# Patient Record
Sex: Male | Born: 1935
Health system: Southern US, Community
[De-identification: ages and names within clinical notes are randomized; demographics above are authoritative.]

## PROBLEM LIST (undated history)

## (undated) DIAGNOSIS — I10 Essential (primary) hypertension: Secondary | ICD-10-CM

## (undated) DIAGNOSIS — Z9981 Dependence on supplemental oxygen: Secondary | ICD-10-CM

## (undated) DIAGNOSIS — Z9889 Other specified postprocedural states: Secondary | ICD-10-CM

## (undated) DIAGNOSIS — N183 Chronic kidney disease, stage 3 unspecified: Secondary | ICD-10-CM

## (undated) DIAGNOSIS — M1712 Unilateral primary osteoarthritis, left knee: Secondary | ICD-10-CM

## (undated) DIAGNOSIS — M1711 Unilateral primary osteoarthritis, right knee: Secondary | ICD-10-CM

## (undated) DIAGNOSIS — E669 Obesity, unspecified: Secondary | ICD-10-CM

## (undated) DIAGNOSIS — H9193 Unspecified hearing loss, bilateral: Secondary | ICD-10-CM

## (undated) DIAGNOSIS — K567 Ileus, unspecified: Secondary | ICD-10-CM

## (undated) DIAGNOSIS — E1169 Type 2 diabetes mellitus with other specified complication: Secondary | ICD-10-CM

## (undated) DIAGNOSIS — K219 Gastro-esophageal reflux disease without esophagitis: Secondary | ICD-10-CM

## (undated) DIAGNOSIS — R112 Nausea with vomiting, unspecified: Secondary | ICD-10-CM

## (undated) DIAGNOSIS — M109 Gout, unspecified: Secondary | ICD-10-CM

## (undated) DIAGNOSIS — E1140 Type 2 diabetes mellitus with diabetic neuropathy, unspecified: Secondary | ICD-10-CM

## (undated) DIAGNOSIS — K9189 Other postprocedural complications and disorders of digestive system: Secondary | ICD-10-CM

## (undated) DIAGNOSIS — I499 Cardiac arrhythmia, unspecified: Secondary | ICD-10-CM

## (undated) HISTORY — DX: Essential (primary) hypertension: I10

## (undated) HISTORY — DX: Type 2 diabetes mellitus with other specified complication: E11.69

## (undated) HISTORY — PX: INNER EAR SURGERY: SHX679

## (undated) HISTORY — DX: Type 2 diabetes mellitus with other specified complication: E66.9

## (undated) HISTORY — DX: Gout, unspecified: M10.9

## (undated) HISTORY — DX: Gastro-esophageal reflux disease without esophagitis: K21.9

## (undated) HISTORY — DX: Unilateral primary osteoarthritis, left knee: M17.12

## (undated) HISTORY — DX: Unilateral primary osteoarthritis, right knee: M17.11

## (undated) HISTORY — DX: Type 2 diabetes mellitus with diabetic neuropathy, unspecified: E11.40

## (undated) HISTORY — PX: HEMORRHOID SURGERY: SHX153

## (undated) HISTORY — PX: CHOLECYSTECTOMY: SHX55

## (undated) HISTORY — PX: TONSILLECTOMY: SUR1361

## (undated) HISTORY — PX: SUPRAPUBIC PROSTATECTOMY: SUR1056

## (undated) HISTORY — PX: COLONOSCOPY: SHX174

---

## 1997-11-08 ENCOUNTER — Ambulatory Visit (HOSPITAL_COMMUNITY): Admission: RE | Admit: 1997-11-08 | Discharge: 1997-11-08 | Payer: Self-pay | Admitting: Urology

## 1998-02-27 ENCOUNTER — Ambulatory Visit (HOSPITAL_COMMUNITY): Admission: RE | Admit: 1998-02-27 | Discharge: 1998-02-27 | Payer: Self-pay | Admitting: *Deleted

## 2000-05-31 HISTORY — PX: KNEE ARTHROSCOPY: SHX127

## 2000-07-04 ENCOUNTER — Encounter: Admission: RE | Admit: 2000-07-04 | Discharge: 2000-08-25 | Payer: Self-pay | Admitting: Orthopedic Surgery

## 2000-07-21 ENCOUNTER — Encounter: Payer: Self-pay | Admitting: Orthopedic Surgery

## 2000-07-22 ENCOUNTER — Ambulatory Visit (HOSPITAL_COMMUNITY): Admission: RE | Admit: 2000-07-22 | Discharge: 2000-07-22 | Payer: Self-pay | Admitting: Orthopedic Surgery

## 2000-08-11 ENCOUNTER — Ambulatory Visit (HOSPITAL_COMMUNITY): Admission: RE | Admit: 2000-08-11 | Discharge: 2000-08-11 | Payer: Self-pay | Admitting: Orthopedic Surgery

## 2001-04-03 ENCOUNTER — Ambulatory Visit (HOSPITAL_COMMUNITY): Admission: RE | Admit: 2001-04-03 | Discharge: 2001-04-03 | Payer: Self-pay | Admitting: *Deleted

## 2004-11-11 ENCOUNTER — Ambulatory Visit: Payer: Self-pay | Admitting: Pulmonary Disease

## 2005-05-31 HISTORY — PX: SHOULDER ARTHROSCOPY WITH ROTATOR CUFF REPAIR: SHX5685

## 2005-11-12 ENCOUNTER — Ambulatory Visit (HOSPITAL_COMMUNITY): Admission: RE | Admit: 2005-11-12 | Discharge: 2005-11-12 | Payer: Self-pay | Admitting: *Deleted

## 2005-12-15 ENCOUNTER — Encounter: Admission: RE | Admit: 2005-12-15 | Discharge: 2006-01-27 | Payer: Self-pay | Admitting: Sports Medicine

## 2006-01-13 ENCOUNTER — Emergency Department (HOSPITAL_COMMUNITY): Admission: EM | Admit: 2006-01-13 | Discharge: 2006-01-13 | Payer: Self-pay | Admitting: Emergency Medicine

## 2006-01-21 ENCOUNTER — Encounter: Admission: RE | Admit: 2006-01-21 | Discharge: 2006-01-21 | Payer: Self-pay | Admitting: Internal Medicine

## 2006-02-01 ENCOUNTER — Encounter: Admission: RE | Admit: 2006-02-01 | Discharge: 2006-02-01 | Payer: Self-pay | Admitting: *Deleted

## 2006-03-14 ENCOUNTER — Encounter: Admission: RE | Admit: 2006-03-14 | Discharge: 2006-03-14 | Payer: Self-pay | Admitting: Sports Medicine

## 2006-04-12 ENCOUNTER — Ambulatory Visit (HOSPITAL_BASED_OUTPATIENT_CLINIC_OR_DEPARTMENT_OTHER): Admission: RE | Admit: 2006-04-12 | Discharge: 2006-04-13 | Payer: Self-pay | Admitting: Orthopedic Surgery

## 2006-05-31 HISTORY — PX: KNEE ARTHROSCOPY: SHX127

## 2006-06-20 ENCOUNTER — Ambulatory Visit (HOSPITAL_BASED_OUTPATIENT_CLINIC_OR_DEPARTMENT_OTHER): Admission: RE | Admit: 2006-06-20 | Discharge: 2006-06-20 | Payer: Self-pay | Admitting: Orthopedic Surgery

## 2007-01-06 ENCOUNTER — Encounter (INDEPENDENT_AMBULATORY_CARE_PROVIDER_SITE_OTHER): Payer: Self-pay | Admitting: *Deleted

## 2007-01-09 ENCOUNTER — Ambulatory Visit (HOSPITAL_BASED_OUTPATIENT_CLINIC_OR_DEPARTMENT_OTHER): Admission: RE | Admit: 2007-01-09 | Discharge: 2007-01-09 | Payer: Self-pay | Admitting: Orthopedic Surgery

## 2007-01-10 ENCOUNTER — Encounter: Payer: Self-pay | Admitting: *Deleted

## 2007-01-15 DIAGNOSIS — F0280 Dementia in other diseases classified elsewhere without behavioral disturbance: Secondary | ICD-10-CM

## 2007-01-15 DIAGNOSIS — J449 Chronic obstructive pulmonary disease, unspecified: Secondary | ICD-10-CM

## 2007-01-15 DIAGNOSIS — J4489 Other specified chronic obstructive pulmonary disease: Secondary | ICD-10-CM | POA: Insufficient documentation

## 2007-01-15 DIAGNOSIS — G309 Alzheimer's disease, unspecified: Secondary | ICD-10-CM

## 2010-05-31 HISTORY — PX: KNEE ARTHROSCOPY: SHX127

## 2010-09-24 ENCOUNTER — Encounter (HOSPITAL_BASED_OUTPATIENT_CLINIC_OR_DEPARTMENT_OTHER)
Admission: RE | Admit: 2010-09-24 | Discharge: 2010-09-24 | Disposition: A | Discharge status: Home / Self Care | Payer: Medicare Other | Source: Ambulatory Visit | Attending: Orthopedic Surgery | Admitting: Orthopedic Surgery

## 2010-09-24 ENCOUNTER — Other Ambulatory Visit: Payer: Self-pay | Admitting: Orthopedic Surgery

## 2010-09-24 ENCOUNTER — Ambulatory Visit
Admission: RE | Admit: 2010-09-24 | Discharge: 2010-09-24 | Disposition: A | Discharge status: Home / Self Care | Payer: Medicare Other | Source: Ambulatory Visit | Attending: Orthopedic Surgery | Admitting: Orthopedic Surgery

## 2010-09-24 DIAGNOSIS — Z01811 Encounter for preprocedural respiratory examination: Secondary | ICD-10-CM

## 2010-09-24 LAB — BASIC METABOLIC PANEL
BUN: 21 mg/dL (ref 6–23)
CO2: 30 mEq/L (ref 19–32)
Calcium: 9.6 mg/dL (ref 8.4–10.5)
Creatinine, Ser: 1.47 mg/dL (ref 0.4–1.5)
GFR calc Af Amer: 57 mL/min — ABNORMAL LOW (ref >60)

## 2010-09-28 ENCOUNTER — Ambulatory Visit (HOSPITAL_BASED_OUTPATIENT_CLINIC_OR_DEPARTMENT_OTHER)
Admission: RE | Admit: 2010-09-28 | Discharge: 2010-09-28 | Disposition: A | Discharge status: Home / Self Care | Payer: Medicare Other | Source: Ambulatory Visit | Attending: Orthopedic Surgery | Admitting: Orthopedic Surgery

## 2010-09-28 DIAGNOSIS — Z01812 Encounter for preprocedural laboratory examination: Secondary | ICD-10-CM | POA: Insufficient documentation

## 2010-09-28 DIAGNOSIS — M23359 Other meniscus derangements, posterior horn of lateral meniscus, unspecified knee: Secondary | ICD-10-CM | POA: Insufficient documentation

## 2010-09-28 DIAGNOSIS — M23329 Other meniscus derangements, posterior horn of medial meniscus, unspecified knee: Secondary | ICD-10-CM | POA: Insufficient documentation

## 2010-09-28 DIAGNOSIS — M2240 Chondromalacia patellae, unspecified knee: Secondary | ICD-10-CM | POA: Insufficient documentation

## 2010-09-28 DIAGNOSIS — M659 Unspecified synovitis and tenosynovitis, unspecified site: Secondary | ICD-10-CM | POA: Insufficient documentation

## 2010-09-28 LAB — GLUCOSE, CAPILLARY: Glucose-Capillary: 151 mg/dL — ABNORMAL HIGH (ref 70–99)

## 2010-10-02 ENCOUNTER — Ambulatory Visit: Payer: Medicare Other | Attending: Orthopedic Surgery | Admitting: Physical Therapy

## 2010-10-02 DIAGNOSIS — M25669 Stiffness of unspecified knee, not elsewhere classified: Secondary | ICD-10-CM | POA: Insufficient documentation

## 2010-10-02 DIAGNOSIS — IMO0001 Reserved for inherently not codable concepts without codable children: Secondary | ICD-10-CM | POA: Insufficient documentation

## 2010-10-02 DIAGNOSIS — R262 Difficulty in walking, not elsewhere classified: Secondary | ICD-10-CM | POA: Insufficient documentation

## 2010-10-02 DIAGNOSIS — M25569 Pain in unspecified knee: Secondary | ICD-10-CM | POA: Insufficient documentation

## 2010-10-06 ENCOUNTER — Ambulatory Visit: Payer: Medicare Other | Admitting: Physical Therapy

## 2010-10-08 ENCOUNTER — Ambulatory Visit: Payer: Medicare Other | Admitting: Physical Therapy

## 2010-10-13 ENCOUNTER — Ambulatory Visit: Payer: Medicare Other | Admitting: Physical Therapy

## 2010-10-13 NOTE — Op Note (Signed)
NAME:  Howard Charles, Howard Charles NO.:  [id number]   MEDICAL RECORD NO.:  [id number]          PATIENT TYPE:  AMB   LOCATION:  DSC                          FACILITY:  [facility]   PHYSICIAN:  Robert A. Thurston Hole, M.D. 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   PROCEDURES:  1. Left knee examination under anesthesia, followed by arthroscopic      partial medial and lateral meniscectomies.  2. Left knee chondroplasty with partial synovectomy.   SURGEON:  Elana Alm. Thurston Hole, M.D.   ASSISTANT:  Julien Girt, P.A.   ANESTHESIA:  Local with MAC.   OPERATIVE TIME:  30 minutes.   COMPLICATIONS:  None.   INDICATIONS FOR PROCEDURES:  Mr. Deeg is a 75 year old gentleman, who  has had significant left knee pain for the past 6 months, increasing in  nature with exam and x-rays and MRI documenting meniscal tearing with  chondromalacia and synovitis.  He has failed conservative care and is  now to undergo arthroscopy.   DESCRIPTION:  Mr. Jantz was brought into the operating room on January 09, 2007, after a knee block was placed in the holding room by  anesthesia.  He was placed on the operating table in supine position.  His left knee was examined under anesthesia.  Range of motion was 0 to  125 degrees, 1 to 2+ crepitation, knee stable to ligamentous exam, with  normal patellar tracking.  The left leg was prepped using sterile  DuraPrep and draped using sterile technique.  Initially, through an  anterolateral portal the arthroscope with a pump attached was placed,  and through an anteromedial portal, an arthroscopic  probe was placed.  On inspection of the medial compartment, he was found to have 75% grade  3 chondromalacia, which we debrided.  Medial meniscal tear,  posteromedial horn, of which 30 to 40% was resected back to a stable  rim.  The intercondylar notch was inspected and the anterior and  posterior cruciate ligaments were normal.  The lateral compartment was  inspected; 25% grade 3 chondromalacia, which was debrided.  Lateral  meniscus, 20% tear, posterolateral corner, was resected back to a stable  rim.  The patellofemoral joint showed 30 the 40% grade 3 chondromalacia,  which was debrided.  The patella tracked normally.  Moderate synovitis  in the medial and lateral gutters.  Otherwise, they were free of  pathology.  After this was done, it was felt that all pathology had been  satisfactorily addressed.  The instruments were removed.  The portals  were closed with 3-0 nylon suture and injected with 0.25% Marcaine with  epinephrine and 4 mg of morphine.  Sterile dressings were applied, and  the patient awakened and taken to the recovery room in stable condition.   FOLLOWUP CARE:  Mr. Mincey will be followed as an outpatient on Vicodin  and Mobic.  See me back in the office in a week for sutures out and  followup.      Robert A. Thurston Hole, M.D.  Electronically Signed     RAW/MEDQ  D:  01/09/2007  T:  01/09/2007  Job:  [id number]

## 2010-10-15 ENCOUNTER — Ambulatory Visit: Payer: Medicare Other | Admitting: Physical Therapy

## 2010-10-16 NOTE — Op Note (Signed)
NAME:  Howard Charles, Howard Charles NO.:  [id number]   MEDICAL RECORD NO.:  [id number]          PATIENT TYPE:  AMB   LOCATION:  DSC                          FACILITY:  [facility]   PHYSICIAN:  Robert A. Thurston Hole, M.D. DATE OF BIRTH:  02/27/1936   DATE OF PROCEDURE:  04/12/2006  DATE OF DISCHARGE:                               OPERATIVE REPORT   PREOPERATIVE DIAGNOSIS:  1. Right shoulder rotator cuff tear.  2. Right shoulder partial labrum tear.  3. Right shoulder adhesive capsulitis.  4. Right shoulder impingement.   PROCEDURE:  1. The right shoulder examination  under anesthesia, followed by      manipulation.  2. Right shoulder arthroscopically-assisted rotator cuff repair using      Arthrex suture anchors x2 with four total sutures.  3. Right shoulder lysis of adhesions.  4. Right shoulder labrum tear debridement.  5. Right shoulder subacromial decompression.   SURGEON:  Elana Alm. Thurston Hole, M.D.   ASSISTANT:  Julien Girt, P.A.   ANESTHESIA:  General.   OPERATIVE TIME:  Was 45 minutes.   COMPLICATIONS:  None.   INDICATIONS FOR PROCEDURE:  Howard Charles is a 75 year old gentleman who  has had six months of increasing right shoulder pain, with exam and MRI  documenting rotator cuff tear, labrum tear, with impingement and  adhesive capsulitis.  He has failed conservative care and is now to  undergo arthroscopy and repair.   DESCRIPTION OF PROCEDURE:  Howard Charles is brought to the operating room  on 04/12/2006, after an inter-scalene block was placed in the holding  area by anesthesia.  He was placed on the operative table in the supine  position.  After being placed under general anesthesia, initial range of  motion showed forward flexion of 130, abduction  of 120, internal and  external rotation of 50 degrees.  A gentle manipulation was carried out,  breaking up adhesions and improving forward flexion to 175, abduction to  175, internal and external rotation of 85  degrees.  The shoulder  remained stable to ligamentous exam.  He received Ancef 1 gram IV  preoperatively for prophylaxis.  His was then placed in the beach chair  position and his shoulder and  arm were prepped using sterile DuraPrep  and draped using a sterile technique.  Originally through a posterior  arthroscopic portal, the arthroscope with a pump attached was placed  into an anterior portal an arthroscopic probe was placed.  On initial  inspection, the articular cartilage and the glenohumeral joint were  found to be intact.  The anterior labrum had a partial tearing of 25%,  which was debrided.  The superior labrum and biceps tendon anchor were  intact.  The biceps tendon was intact.  The inferior labrum and anterior  inferior glenohumeral ligament complex were intact.  The posterior  labrum was intact.  The rotator cuff showed a complete tear of the supra-  spinatus, a partial tear of the infra-spinatus, and this was partially  debrided arthroscopically.  The rest of the rotator cuff was intact.  The inferior capsular recess showed a large amount of  erythematous  synovitis which was partially debrided.  The subacromial space was  entered and a lateral arthroscopic portal was made.  Moderately-  thickened bursitis was resected.  Impingement was noted and a  subacromial decompression was carried out, removing 6 mm to 8 mm of the  undersurface of the anterior, anterolateral and anteromedial acromion  and CA ligament release carried out as well.  The [facility] joint was not  impinging on motion and thus was not resected.  The rotator cuff tear  was well-visualized.  It was further debrided arthroscopically and then  through an accessory lateral portal, two separate Arthrex 5.5 mm suture  anchors were placed in the greater tuberosity.  Each of these anchors  had two sutures and each of the sutures were passed arthroscopically  through the rotator cuff tear and then tied down, thus securing  the  rotator cuff tear back down to the greater tuberosity.  A firm and tight  repair was achieved and the shoulder could be brought through a full  range of motion, with no impingement on the repair.  At this point, it  was felt that all pathology been satisfactorily addressed.  The  instruments were removed.  The portals closed with #3-0 nylon sutures.   Sterile dressings and a sling were applied, and the patient awakened and  taken to the recovery room in stable condition.   FOLLOW-UP CARE:  Howard Charles will be followed as an outpatient on  Percocet and Robaxin with early physical therapy.  He will be seen back  in the office in one week for sutures out and followup.      Robert A. Thurston Hole, M.D.  Electronically Signed     RAW/MEDQ  D:  04/12/2006  T:  04/12/2006  Job:  [id number]

## 2010-10-16 NOTE — Op Note (Signed)
[facility]  Patient:    Howard Charles, Howard Charles                       MRN: [id number] Proc. Date: 07/22/00 Attending:  Fayrene Fearing P. Aplington, M.D.                           Operative Report  PREOPERATIVE DIAGNOSIS:  Stiff right knee status post arthroscopy.  POSTOPERATIVE DIAGNOSES:  Stiff right knee status post arthroscopy.  OPERATION:  Closed manipulation right knee.  SURGEON:  Illene Labrador. Aplington, M.D.  ASSISTANT:  Nurse.  ANESTHESIA:  General.  PATHOLOGY AND INDICATIONS FOR PROCEDURE:  He had had a prior arthrotomy as a teenager and had a knee arthroscopy by me about five and one-half weeks ago. Despite aggressive physical therapy, motion passively has only been from 10 degrees to 75 degrees.  Accordingly, he is here today for the closed manipulation.  DESCRIPTION OF PROCEDURE:  Satisfactory general anesthesia.  I then brought his right leg over the edge of the table and, supporting his thigh with my left forearm, began gentle flexion of the knee with audible and palpable lysis of adhesions.  I then went back and forth between flexion and extension.  With extension, once again audible and palpable lysis of adhesions, and I was able to restore motion from 0 to 120 degrees of flexion.  He was then going to be given 30 mg of Toradol IV.  At the time of this dictation, he is being awakened to go to the recovery room with no known complications. DD:  07/22/00 TD:  07/23/00 Job: [id number]

## 2010-10-16 NOTE — Op Note (Signed)
NAME:  Howard Charles, ARANA NO.:  [id number]   MEDICAL RECORD NO.:  [id number]          PATIENT TYPE:  AMB   LOCATION:  DSC                          FACILITY:  [facility]   PHYSICIAN:  Robert A. Thurston Hole, M.D. DATE OF BIRTH:  [birth year]   DATE OF PROCEDURE:  06/20/2006  DATE OF DISCHARGE:                               OPERATIVE REPORT   PREOPERATIVE DIAGNOSES:  Right shoulder postoperative adhesive  capsulitis, status post rotator cuff repair and lysis of adhesions.   POSTOPERATIVE DIAGNOSES:  Right shoulder postoperative adhesive  capsulitis, status post rotator cuff repair and lysis of adhesions.   PROCEDURES:  Right shoulder examination under anesthesia, followed by  manipulation and injection.   SURGEON:  Elana Alm. Thurston Hole, M.D.   ANESTHESIA:  General.   OPERATIVE TIME:  10 minutes.   COMPLICATIONS:  None.   INDICATIONS FOR PROCEDURES:  Mr. Mcglory is a 75 year old gentleman, who  had undergone a right shoulder rotator cuff repair with arthroscopic  lysis of adhesions approximately 2 1/2 months ago.  He has had  significant persistent loss of motion and pain secondary to this.  Even  though it appears the rotator cuff is healing nicely, his adhesive  capsulitis is inhibiting his recovery, and he is not to undergo  manipulation and injection.   DESCRIPTION:  Mr. Alwin was brought to the operating room on June 20, 2006, after an interscalene block was placed in the holding room by  Anesthesia.  He was placed on the operating table in supine position.  After being placed under general anesthesia, his right shoulder was  examined.  Initial range of motion showed forward flexion of 150,  abduction of 130, internal and external rotation of 40 degrees.  A  gentle manipulation was carried out, breaking up adhesions and improving  forward flexion the 175, abduction to 170, and internal and external  rotation of 85 degrees.  The shoulder remained stable to  ligamentous  exam.  The shoulder was then sterilely injected with 80 mg of Depo-  Medrol and 10 mL of 0.2% Marcaine with epinephrine, with half of this  mixture being placed in the subacromial space and half of it in the  joint through an anterior injection under sterile conditions, and he  tolerated this well.  At this point, it was felt that all pathology had  been satisfactorily addressed.  He was then awakened, extubated and  taken to recovery in stable condition.   FOLLOWUP CARE:  Mr. Granville will be followed as an outpatient on  Percocet for pain, with early aggressive physical therapy.  He will seen  back in the office in a week for sutures out and followup.      Robert A. Thurston Hole, M.D.  Electronically Signed     RAW/MEDQ  D:  06/20/2006  T:  06/20/2006  Job:  [id number]

## 2010-10-16 NOTE — Op Note (Signed)
NAME:  Howard Charles, Howard Charles NO.:  [id number]   MEDICAL RECORD NO.:  [id number]          PATIENT TYPE:  AMB   LOCATION:  ENDO                         FACILITY:  [facility]   PHYSICIAN:  Georgiana Spinner, M.D.    DATE OF BIRTH:  July 23, 1935   DATE OF PROCEDURE:  DATE OF DISCHARGE:                                 OPERATIVE REPORT   PROCEDURE:  Colonoscopy.   INDICATIONS:  Colon polyps.   ANESTHESIA:  Fentanyl 10 mcg, Versed 2 mg.   PROCEDURE:  With the patient mildly sedated in the left lateral decubitus  position, the Olympus videoscopic colonoscope was inserted into the rectum  and passed under direct vision to the cecum identified by the ileocecal  valve and appendiceal orifice both of which were photographed. From this  point, the colonoscope was slowly withdrawn taking circumferential views of  the colonic mucosa stopping only in the rectum which appeared normal on  direct and showed hemorrhoids on retroflexed view. The endoscope was  straightened and withdrawn.  The patient's vital signs and pulse oximeter  remained stable.  The patient tolerated the procedure well without apparent  complications.   FINDINGS:  Internal hemorrhoids otherwise an unremarkable colonoscopic  examination to the cecum.   PLAN:  Repeat examination in 5 years.           ______________________________  Georgiana Spinner, M.D.     GMO/MEDQ  D:  11/12/2005  T:  11/12/2005  Job:  [id number]

## 2010-10-16 NOTE — Op Note (Signed)
[facility]  Patient:    Howard Charles, Howard Charles                      MRN: [id number] Proc. Date: 08/11/00 Adm. Date:  [id number] Attending:  Marlowe Kays Page                           Operative Report  PREOPERATIVE DIAGNOSIS:  Post arthroscopy stiffness, right knee.  POSTOPERATIVE DIAGNOSIS:  Post arthroscopy stiffness, right knee.  OPERATION PERFORMED:  Closed manipulation, right knee, with intra-articular injection 0.5% plain Marcaine.  SURGEON:  Dr. Simonne Come.  ASSISTANT:  Nurse.  ANESTHESIA: General.  PATHOLOGY AND JUSTIFICATION FOR PROCEDURE:  His original arthroscopic procedure was on June 13, 2000 or exactly two months ago. Because of stiffness, I manipulated him on July 22, 2000 with complete restoration of motion. Despite vigorous physical therapy once again, he has persisted to having some slight loss of motion from -5 to 9 degrees and is here again for a repeat manipulation.  DESCRIPTION OF PROCEDURE:  Satisfactory general anesthesia, I brought his right leg off the side of OR bed and with my left arm beneath the thigh began gentle but forceful flexion of the right knee with audible and palpable lysis of adhesions. I then brought the knee up to full extension and performed the same maneuver. I then moved back and forth between flexion and extension until his extension was symmetrical on the two knees and both knees on flexion were equal as well in flexion. At that point, I prepped the prepatellar tendon area with Betadine and injected 20 cc of 0.5% plain Marcaine into the knee joint through the transpatellar tendon approach. At the time of this dictation, he was being awakened and taken to the recovery room in satisfactory condition with no known complications. DD:  08/11/00 TD:  08/11/00 Job: [id number]

## 2010-10-16 NOTE — Op Note (Signed)
NAME:  Howard Charles, Howard Charles NO.:  [id number]   MEDICAL RECORD NO.:  [id number]          PATIENT TYPE:  AMB   LOCATION:  ENDO                         FACILITY:  [facility]   PHYSICIAN:  Georgiana Spinner, M.D.    DATE OF BIRTH:  1936/04/27   DATE OF PROCEDURE:  11/12/2005  DATE OF DISCHARGE:                                 OPERATIVE REPORT   PROCEDURES:  Upper endoscopy.   INDICATIONS:  GERD.   ANESTHESIA:  Fentanyl 50 mcg, Versed 6 mg.   PROCEDURE:  With the patient mildly sedated in the left lateral decubitus  position, the Olympus videoscopic endoscope was inserted in the mouth,  passed under direct vision through the esophagus which appeared normal.  There was a hiatal hernia seen.  We entered into the stomach.  Fundus, body,  antrum, duodenal bulb and second portion of duodenum appeared normal.  From  this point, the endoscope was slowly withdrawn taking circumferential views  of duodenal mucosa until the endoscope had been pulled back into the stomach  and placed in retroflexion to view the stomach from below.  The endoscope  was then straightened and withdrawn taking circumferential views remaining  gastric and esophageal mucosa.  The patient's vital signs and pulse oximeter  remained stable.  The patient tolerated the procedure well without apparent  complication.   FINDINGS:  Hiatal hernia, otherwise unremarkable exam.   PLAN:  Proceed to colonoscopy.           ______________________________  Georgiana Spinner, M.D.     GMO/MEDQ  D:  11/12/2005  T:  11/12/2005  Job:  [id number]

## 2010-10-16 NOTE — Procedures (Signed)
[facility]  Patient:    SHAYA, ALTAMURA Visit Number: [id number] MRN: [id number]          Service Type: END Location: ENDO Attending Physician:  Sabino Gasser Dictated by:   Sabino Gasser, M.D. Proc. Date: 04/03/01 Admit Date:  04/03/2001                             Procedure Report  PROCEDURE:  Colonoscopy.  INDICATIONS:  Rectal bleeding.  ANESTHESIA:  Demerol 70, Versed 7 mg.  ENDOSCOPIST:  Sabino Gasser, M.D.  PROCEDURE:  With the patient mildly sedated in the left lateral decubitus position the Olympus videoscopic colonoscope was inserted in the rectum and passed under direct vision to the cecum identified by the ileocecal valve and appendiceal orifice both of which were photographed.  At this point, the colonoscope was slowly withdrawn taking circumferential views of the entire colonic mucosa stopping only in the rectum which appeared normal on direct and retroflexed view.  The endoscope was straightened and withdrawn.  The patients vital signs and pulse oximeter remained stable.  The patient tolerated the procedure well with no apparent complications.  FINDINGS:  Unremarkable colonoscopic examination.  PLAN:  Repeat examination in five years. Dictated by:   Sabino Gasser, M.D. Attending Physician:  Sabino Gasser DD:  04/03/01 TD:  04/04/01 Job: [id number]

## 2010-10-20 ENCOUNTER — Ambulatory Visit: Payer: Medicare Other | Admitting: Physical Therapy

## 2010-10-20 NOTE — Op Note (Signed)
NAME:  Howard Charles, Howard Charles NO.:  [id number]  MEDICAL RECORD NO.:  [id number]          PATIENT TYPE:  LOCATION:                                 FACILITY:  PHYSICIAN:  Elana Alm. Thurston Hole, M.D.      DATE OF BIRTH:  DATE OF PROCEDURE:  09/28/2010 DATE OF DISCHARGE:                              OPERATIVE REPORT   PREOPERATIVE DIAGNOSIS:  Left knee medial and lateral meniscal tears with chondromalacia and synovitis.  POSTOPERATIVE DIAGNOSIS:  Left knee medial and lateral meniscal tears with chondromalacia and synovitis.  PROCEDURE: 1. Left knee examination under anesthesia followed by arthroscopic     partial medial and lateral meniscectomy. 2. Left knee chondroplasty with partial synovectomy.  SURGEON:  Elana Alm. Thurston Hole, MD  ANESTHESIA:  General.  OPERATIVE TIME:  30 minutes.  COMPLICATIONS:  None.  INDICATIONS FOR PROCEDURE:  Mr. Aeschliman is a 75 year old gentleman who has had significant increasing left knee pain for the past 4-5 months with exam and MRI documenting meniscal tearing with chondromalacia and synovitis.  He has failed conservative care and is now to undergo arthroscopy.  DESCRIPTION OF PROCEDURE:  Mr. Petraglia was brought to the operating room on September 28, 2010 after a knee block was placed in the holding room by Anesthesia.  He was placed on the operative table in supine position. He received Ancef 1 gram IV preoperatively for prophylaxis.  After being placed under general anesthesia, his left knee was examined.  He had full range of motion.  Knee was stable to ligamentous exam with normal patellar tracking.  Left leg was prepped using sterile DuraPrep and draped using sterile technique.  Time-out procedure was called and the correct left knee identified.  Initially through an anterolateral portal, the arthroscope with a pump attached was placed into an anteromedial portal and arthroscopic probe was placed.  On initial inspection of the  medial compartment, he was found to have 50-60% grade 3 chondromalacia which was debrided.  Medial meniscus tear and posteromedial horn of which 30% was resected back to a stable rim. Intercondylar notch was inspected.  Anterior and posterior cruciate ligaments were normal.  Lateral compartment inspected.  The articular cartilage showed only grade 1 or 2 chondromalacia.  Lateral meniscus showed a partial tear, 20% posterolateral corner which was resected back to a stable rim.  Patellofemoral joint showed 25-30% grade 3 chondromalacia which was debrided.  The patella tracked normally.  There was moderate scarred synovitis in medial and lateral gutters which was thoroughly debrided, otherwise this was free of pathology.  After this was done, it was felt that all pathology had been satisfactorily addressed.  The instruments were removed.  Portals closed with 3-0 nylon suture.  Sterile dressings were applied and the patient awakened and taken to the recovery room in stable condition.  FOLLOWUP CARE:  Mr. Pirro will be followed as an outpatient on Norco for pain.  Seen back in the office in a week for sutures out and followup.     Yaakov Saindon A. Thurston Hole, M.D.     RAW/MEDQ  D:  09/29/2010  T:  09/29/2010  Job:  [id number]  Electronically Signed by Salvatore Marvel M.D. on 10/20/2010 05:21:01 PM

## 2010-10-22 ENCOUNTER — Ambulatory Visit: Payer: Medicare Other | Admitting: Physical Therapy

## 2010-10-27 ENCOUNTER — Ambulatory Visit: Payer: Medicare Other | Admitting: Physical Therapy

## 2010-10-29 ENCOUNTER — Ambulatory Visit: Payer: Medicare Other | Admitting: Physical Therapy

## 2010-11-03 ENCOUNTER — Ambulatory Visit: Payer: Medicare Other | Attending: Orthopedic Surgery | Admitting: Physical Therapy

## 2010-11-03 DIAGNOSIS — M25569 Pain in unspecified knee: Secondary | ICD-10-CM | POA: Insufficient documentation

## 2010-11-03 DIAGNOSIS — IMO0001 Reserved for inherently not codable concepts without codable children: Secondary | ICD-10-CM | POA: Insufficient documentation

## 2010-11-03 DIAGNOSIS — M25669 Stiffness of unspecified knee, not elsewhere classified: Secondary | ICD-10-CM | POA: Insufficient documentation

## 2010-11-03 DIAGNOSIS — R262 Difficulty in walking, not elsewhere classified: Secondary | ICD-10-CM | POA: Insufficient documentation

## 2010-11-05 ENCOUNTER — Ambulatory Visit: Payer: Medicare Other | Admitting: Physical Therapy

## 2010-11-17 ENCOUNTER — Ambulatory Visit: Payer: Medicare Other | Admitting: Physical Therapy

## 2010-11-19 ENCOUNTER — Ambulatory Visit: Payer: Medicare Other | Admitting: Physical Therapy

## 2010-11-24 ENCOUNTER — Ambulatory Visit: Payer: Medicare Other | Admitting: Physical Therapy

## 2010-11-26 ENCOUNTER — Ambulatory Visit: Payer: Medicare Other | Admitting: Physical Therapy

## 2010-12-01 ENCOUNTER — Ambulatory Visit: Payer: Medicare Other | Attending: Orthopedic Surgery | Admitting: Physical Therapy

## 2010-12-01 DIAGNOSIS — M25669 Stiffness of unspecified knee, not elsewhere classified: Secondary | ICD-10-CM | POA: Insufficient documentation

## 2010-12-01 DIAGNOSIS — IMO0001 Reserved for inherently not codable concepts without codable children: Secondary | ICD-10-CM | POA: Insufficient documentation

## 2010-12-01 DIAGNOSIS — R262 Difficulty in walking, not elsewhere classified: Secondary | ICD-10-CM | POA: Insufficient documentation

## 2010-12-01 DIAGNOSIS — M25569 Pain in unspecified knee: Secondary | ICD-10-CM | POA: Insufficient documentation

## 2011-03-15 LAB — BASIC METABOLIC PANEL
BUN: 27 — ABNORMAL HIGH
CO2: 24
GFR calc non Af Amer: 49 — ABNORMAL LOW
Glucose, Bld: 163 — ABNORMAL HIGH
Potassium: 4.5
Sodium: 138

## 2011-07-06 ENCOUNTER — Other Ambulatory Visit: Payer: Self-pay | Admitting: Orthopedic Surgery

## 2011-07-06 DIAGNOSIS — M25531 Pain in right wrist: Secondary | ICD-10-CM

## 2011-07-08 ENCOUNTER — Ambulatory Visit
Admission: RE | Admit: 2011-07-08 | Discharge: 2011-07-08 | Disposition: A | Discharge status: Home / Self Care | Payer: Medicare Other | Source: Ambulatory Visit | Attending: Orthopedic Surgery | Admitting: Orthopedic Surgery

## 2011-07-08 DIAGNOSIS — M25531 Pain in right wrist: Secondary | ICD-10-CM

## 2012-06-14 ENCOUNTER — Encounter: Payer: Self-pay | Admitting: Physician Assistant

## 2012-06-14 ENCOUNTER — Encounter (HOSPITAL_COMMUNITY): Payer: Self-pay | Admitting: Pharmacy Technician

## 2012-06-14 ENCOUNTER — Other Ambulatory Visit: Payer: Self-pay | Admitting: Physician Assistant

## 2012-06-14 NOTE — H&P (Signed)
TOTAL KNEE ADMISSION H&P  Patient is being admitted for left total knee arthroplasty.  Subjective:  Chief Complaint:left knee pain.  HPI: Howard Charles, 77 y.o. male, has a history of pain and functional disability in the left knee due to arthritis and has failed non-surgical conservative treatments for greater than 12 weeks to includeNSAID's and/or analgesics, corticosteriod injections, flexibility and strengthening excercises, supervised PT with diminished ADL's post treatment, use of assistive devices and activity modification.  Onset of symptoms was gradual, starting 6 years ago with gradually worsening course since that time. The patient noted prior procedures on the knee to include  arthroscopy and menisectomy on the left knee(s).  Patient currently rates pain in the left knee(s) at 8 out of 10 with activity. Patient has night pain, worsening of pain with activity and weight bearing, pain that interferes with activities of daily living, crepitus and joint swelling.  Patient has evidence of subchondral sclerosis, periarticular osteophytes and joint space narrowing by imaging studies.  There is no active infection.  Patient Active Problem List   Diagnosis Date Noted  . ALZHEIMER'S DISEASE 01/15/2007  . COPD 01/15/2007   Past Medical History  Diagnosis Date  . Diabetes mellitus type 2 in obese   . Diabetic neuropathy   . Hypertension   . GERD (gastroesophageal reflux disease)   . Gout   . Left knee DJD   . Right knee DJD     Past Surgical History  Procedure Date  . Knee arthroscopy 2008    left   . Knee arthroscopy 2012    left  . Knee arthroscopy 2002    right  . Hemorrhoid surgery   . Inner ear surgery   . Shoulder arthroscopy with rotator cuff repair 2007     (Not in a hospital admission) No Known Allergies  History  Substance Use Topics  . Smoking status: Never Smoker   . Smokeless tobacco: Not on file  . Alcohol Use: No    Family History  Problem Relation Age  of Onset  . Hypertension Mother   . Diabetes Mother   . Heart disease Mother   . Heart disease Father   . Heart attack Father   . Hypertension Father   . Diabetes Mellitus II Father      Review of Systems  Constitutional: Negative.   HENT: Negative.        Hard of hearing   Hearing aids Visual deficit corrected by glasses  Eyes: Negative.   Respiratory: Negative.   Cardiovascular: Negative.   Gastrointestinal: Negative.   Genitourinary: Negative.   Musculoskeletal: Positive for joint pain.       Bilateral knee left worse than right  Skin: Negative.   Neurological: Negative.   Endo/Heme/Allergies: Negative.   Psychiatric/Behavioral: Negative.     Objective:  Physical Exam  Constitutional: He is oriented to person, place, and time. He appears well-developed and well-nourished.  HENT:  Head: Normocephalic and atraumatic.  Eyes: EOM are normal. Pupils are equal, round, and reactive to light.  Neck: Neck supple.  Cardiovascular: Normal rate.   Respiratory: Effort normal and breath sounds normal.  GI: Soft. Bowel sounds are normal.  Genitourinary:       Not pertinent to current symptomatology therefore not examined.  Musculoskeletal:       Examination of his left knee reveals pain medially and laterally.  1+ crepitation.  1+ synovitis.  Range of motion -5 to 125 degrees.  Knee is stable with normal patella tracking and  diffuse pain.  Examination of his right knee reveals 1+ crepitation.  1+ synovitis.  Range of motion -5 to 125 degrees.  Mild varus deformity.  Knee is stable to ligamentous exam with normal patella tracking.  Vascular exam: Pulses are 2+ and symmetric.    Neurological: He is alert and oriented to person, place, and time.  Skin: Skin is warm and dry.  Psychiatric: He has a normal mood and affect.    Vital signs in last 24 hours: . Last recorded: 01/15 1500   BP: 118/75 Pulse: 69  Temp: 97.5 F (36.4 C) Resp: 12  Height: 5\' 9"  (1.753 m) SpO2: 95    Weight: 97.07 kg (214 lb)     Labs:   Estimated Body mass index is 31.60 kg/(m^2) as calculated from the following:   Height as of this encounter: 5\' 9" (1.753 m).   Weight as of this encounter: 214 lb(97.07 kg).   Imaging Review Plain radiographs demonstrate severe degenerative joint disease of the left knee(s). The overall alignment ismild valgus. The bone quality appears to be good for age and reported activity level.  Assessment/Plan:  End stage arthritis, left knee   The patient history, physical examination, clinical judgment of the provider and imaging studies are consistent with end stage degenerative joint disease of the left knee(s) and total knee arthroplasty is deemed medically necessary. The treatment options including medical management, injection therapy arthroscopy and arthroplasty were discussed at length. The risks and benefits of total knee arthroplasty were presented and reviewed. The risks due to aseptic loosening, infection, stiffness, patella tracking problems, thromboembolic complications and other imponderables were discussed. The patient acknowledged the explanation, agreed to proceed with the plan and consent was signed. Patient is being admitted for inpatient treatment for surgery, pain control, PT, OT, prophylactic antibiotics, VTE prophylaxis, progressive ambulation and ADL's and discharge planning. The patient is planning to be discharged home with home health services  Ahijah Devery A. Gwinda Passe Physician Assistant [facility] [phone]  06/14/2012, 4:52 PM

## 2012-06-19 NOTE — Pre-Procedure Instructions (Signed)
Howard Charles  06/19/2012   Your procedure is scheduled on:  Monday, January 27th   Report to [facility] at  7:45  AM.  Call this number if you have problems the morning of surgery: [phone]   Remember:   Do not eat food or drink liquids after midnight Sunday.   Take these medicines the morning of surgery with A SIP OF WATER:  Coreg, Protonix, Requip   Do not wear jewelry, make-up or nail polish.  Do not wear lotions, powders, or colognes. You may NOT wear deodorant.   Men may shave face and neck.   Do not bring valuables to the hospital.  Contacts, dentures or bridgework may not be worn into surgery.   Leave suitcase in the car. After surgery it may be brought to your room.  For patients admitted to the hospital, checkout time is 11:00 AM the day of discharge.   Patients discharged the day of surgery will not be allowed to drive home.  Name and phone number of your driver:   Special Instructions: Shower using CHG 2 nights before surgery and the night before surgery.  If you shower the day of surgery use CHG.  Use special wash - you have one bottle of CHG for all showers.  You should use approximately 1/3 of the bottle for each shower.   Please read over the following fact sheets that you were given: Pain Booklet, Coughing and Deep Breathing, Blood Transfusion Information, MRSA Information and Surgical Site Infection Prevention

## 2012-06-20 ENCOUNTER — Encounter (HOSPITAL_COMMUNITY): Payer: Self-pay

## 2012-06-20 ENCOUNTER — Encounter (HOSPITAL_COMMUNITY)
Admission: RE | Admit: 2012-06-20 | Discharge: 2012-06-20 | Disposition: A | Discharge status: Home / Self Care | Payer: Medicare Other | Source: Ambulatory Visit | Attending: Physician Assistant | Admitting: Physician Assistant

## 2012-06-20 ENCOUNTER — Encounter (HOSPITAL_COMMUNITY)
Admission: RE | Admit: 2012-06-20 | Discharge: 2012-06-20 | Disposition: A | Discharge status: Home / Self Care | Payer: Medicare Other | Source: Ambulatory Visit | Attending: Orthopedic Surgery | Admitting: Orthopedic Surgery

## 2012-06-20 HISTORY — DX: Other specified postprocedural states: Z98.890

## 2012-06-20 HISTORY — DX: Nausea with vomiting, unspecified: R11.2

## 2012-06-20 HISTORY — DX: Unspecified hearing loss, bilateral: H91.93

## 2012-06-20 LAB — COMPREHENSIVE METABOLIC PANEL
Alkaline Phosphatase: 102 U/L (ref 39–117)
BUN: 25 mg/dL — ABNORMAL HIGH (ref 6–23)
Calcium: 10.1 mg/dL (ref 8.4–10.5)
Creatinine, Ser: 1.43 mg/dL — ABNORMAL HIGH (ref 0.50–1.35)
GFR calc Af Amer: 54 mL/min — ABNORMAL LOW (ref >90)
Glucose, Bld: 127 mg/dL — ABNORMAL HIGH (ref 70–99)
Potassium: 3.8 mEq/L (ref 3.5–5.1)
Total Protein: 7.1 g/dL (ref 6.0–8.3)

## 2012-06-20 LAB — ABO/RH: ABO/RH(D): O POS

## 2012-06-20 LAB — CBC WITH DIFFERENTIAL/PLATELET
Eosinophils Absolute: 0.8 10*3/uL — ABNORMAL HIGH (ref 0.0–0.7)
Eosinophils Relative: 9 % — ABNORMAL HIGH (ref 0–5)
HCT: 45.3 % (ref 39.0–52.0)
Hemoglobin: 16.1 g/dL (ref 13.0–17.0)
Lymphs Abs: 2.3 10*3/uL (ref 0.7–4.0)
MCH: 32.9 pg (ref 26.0–34.0)
MCHC: 35.5 g/dL (ref 30.0–36.0)
MCV: 92.4 fL (ref 78.0–100.0)
Monocytes Absolute: 0.7 10*3/uL (ref 0.1–1.0)
Monocytes Relative: 8 % (ref 3–12)
RBC: 4.9 MIL/uL (ref 4.22–5.81)

## 2012-06-20 LAB — URINALYSIS, ROUTINE W REFLEX MICROSCOPIC
Bilirubin Urine: NEGATIVE
Hgb urine dipstick: NEGATIVE
Specific Gravity, Urine: 1.018 (ref 1.005–1.030)
Urobilinogen, UA: 1 mg/dL (ref 0.0–1.0)

## 2012-06-20 LAB — PROTIME-INR
INR: 1 (ref 0.00–1.49)
Prothrombin Time: 13.1 seconds (ref 11.6–15.2)

## 2012-06-20 LAB — TYPE AND SCREEN: Antibody Screen: NEGATIVE

## 2012-06-20 MED ORDER — CHLORHEXIDINE GLUCONATE 4 % EX LIQD: 60.0000 mL | Freq: Once | CUTANEOUS | Status: DC

## 2012-06-20 NOTE — Progress Notes (Addendum)
905  PT STATED HIS PCP JUST WANTED HIM TO SEE A CARDIO JUST FOR GOOD MEASURES.York Spaniel...HAVE REQUESTED NUC STRESS TEST....DA  1025.Marland KitchenMarland KitchenMarland KitchenReceived last office note, echo, stress test and ekg placed in chart.........DA  1050  Spoke with Dr. Thurston Hole concerning today's cxr result..white blood cell count is normal...kidney function up alittle. Dr Thurston Hole said they would order a chest ct for this nice gentleman...Marland KitchenDA

## 2012-06-21 LAB — URINE CULTURE: Culture: NO GROWTH

## 2012-06-25 MED ORDER — CEFAZOLIN SODIUM-DEXTROSE 2-3 GM-% IV SOLR
2.0000 g | INTRAVENOUS | Status: AC
  Administered 2012-06-26: 2 g via INTRAVENOUS
  Filled 2012-06-25: qty 50

## 2012-06-26 ENCOUNTER — Encounter (HOSPITAL_COMMUNITY): Payer: Self-pay | Admitting: Physician Assistant

## 2012-06-26 ENCOUNTER — Inpatient Hospital Stay (HOSPITAL_COMMUNITY): Payer: Medicare Other | Admitting: Anesthesiology

## 2012-06-26 ENCOUNTER — Inpatient Hospital Stay (HOSPITAL_COMMUNITY)
Admission: RE | Admit: 2012-06-26 | Discharge: 2012-06-29 | DRG: 470 | Disposition: A | Discharge status: Home Health | Payer: Medicare Other | Source: Ambulatory Visit | Attending: Orthopedic Surgery | Admitting: Orthopedic Surgery

## 2012-06-26 ENCOUNTER — Encounter (HOSPITAL_COMMUNITY)
Admission: RE | Disposition: A | Discharge status: Home Health | Payer: Self-pay | Source: Ambulatory Visit | Attending: Orthopedic Surgery

## 2012-06-26 ENCOUNTER — Encounter (HOSPITAL_COMMUNITY): Payer: Self-pay | Admitting: Anesthesiology

## 2012-06-26 DIAGNOSIS — I10 Essential (primary) hypertension: Secondary | ICD-10-CM | POA: Diagnosis present

## 2012-06-26 DIAGNOSIS — E860 Dehydration: Secondary | ICD-10-CM | POA: Diagnosis not present

## 2012-06-26 DIAGNOSIS — Z01812 Encounter for preprocedural laboratory examination: Secondary | ICD-10-CM

## 2012-06-26 DIAGNOSIS — I4891 Unspecified atrial fibrillation: Secondary | ICD-10-CM

## 2012-06-26 DIAGNOSIS — Z79899 Other long term (current) drug therapy: Secondary | ICD-10-CM

## 2012-06-26 DIAGNOSIS — I129 Hypertensive chronic kidney disease with stage 1 through stage 4 chronic kidney disease, or unspecified chronic kidney disease: Secondary | ICD-10-CM | POA: Diagnosis present

## 2012-06-26 DIAGNOSIS — Z7982 Long term (current) use of aspirin: Secondary | ICD-10-CM

## 2012-06-26 DIAGNOSIS — I4729 Other ventricular tachycardia: Secondary | ICD-10-CM | POA: Diagnosis not present

## 2012-06-26 DIAGNOSIS — E1142 Type 2 diabetes mellitus with diabetic polyneuropathy: Secondary | ICD-10-CM | POA: Diagnosis present

## 2012-06-26 DIAGNOSIS — I472 Ventricular tachycardia, unspecified: Secondary | ICD-10-CM | POA: Diagnosis not present

## 2012-06-26 DIAGNOSIS — J4489 Other specified chronic obstructive pulmonary disease: Secondary | ICD-10-CM | POA: Diagnosis present

## 2012-06-26 DIAGNOSIS — E669 Obesity, unspecified: Secondary | ICD-10-CM | POA: Diagnosis present

## 2012-06-26 DIAGNOSIS — Z794 Long term (current) use of insulin: Secondary | ICD-10-CM

## 2012-06-26 DIAGNOSIS — I499 Cardiac arrhythmia, unspecified: Secondary | ICD-10-CM | POA: Diagnosis not present

## 2012-06-26 DIAGNOSIS — I4892 Unspecified atrial flutter: Secondary | ICD-10-CM | POA: Diagnosis not present

## 2012-06-26 DIAGNOSIS — E1169 Type 2 diabetes mellitus with other specified complication: Secondary | ICD-10-CM | POA: Diagnosis present

## 2012-06-26 DIAGNOSIS — M1712 Unilateral primary osteoarthritis, left knee: Secondary | ICD-10-CM

## 2012-06-26 DIAGNOSIS — E1149 Type 2 diabetes mellitus with other diabetic neurological complication: Secondary | ICD-10-CM | POA: Diagnosis not present

## 2012-06-26 DIAGNOSIS — H9190 Unspecified hearing loss, unspecified ear: Secondary | ICD-10-CM | POA: Diagnosis present

## 2012-06-26 DIAGNOSIS — Z01810 Encounter for preprocedural cardiovascular examination: Secondary | ICD-10-CM

## 2012-06-26 DIAGNOSIS — G309 Alzheimer's disease, unspecified: Secondary | ICD-10-CM | POA: Diagnosis present

## 2012-06-26 DIAGNOSIS — Z01818 Encounter for other preprocedural examination: Secondary | ICD-10-CM

## 2012-06-26 DIAGNOSIS — M1710 Unilateral primary osteoarthritis, unspecified knee: Principal | ICD-10-CM | POA: Diagnosis present

## 2012-06-26 DIAGNOSIS — M109 Gout, unspecified: Secondary | ICD-10-CM | POA: Diagnosis present

## 2012-06-26 DIAGNOSIS — E785 Hyperlipidemia, unspecified: Secondary | ICD-10-CM | POA: Diagnosis present

## 2012-06-26 DIAGNOSIS — E1140 Type 2 diabetes mellitus with diabetic neuropathy, unspecified: Secondary | ICD-10-CM | POA: Diagnosis present

## 2012-06-26 DIAGNOSIS — N183 Chronic kidney disease, stage 3 unspecified: Secondary | ICD-10-CM | POA: Diagnosis present

## 2012-06-26 DIAGNOSIS — F0280 Dementia in other diseases classified elsewhere without behavioral disturbance: Secondary | ICD-10-CM | POA: Diagnosis present

## 2012-06-26 DIAGNOSIS — J449 Chronic obstructive pulmonary disease, unspecified: Secondary | ICD-10-CM | POA: Diagnosis present

## 2012-06-26 DIAGNOSIS — K219 Gastro-esophageal reflux disease without esophagitis: Secondary | ICD-10-CM | POA: Diagnosis present

## 2012-06-26 DIAGNOSIS — H9193 Unspecified hearing loss, bilateral: Secondary | ICD-10-CM | POA: Diagnosis present

## 2012-06-26 HISTORY — PX: TOTAL KNEE ARTHROPLASTY: SHX125

## 2012-06-26 HISTORY — DX: Chronic kidney disease, stage 3 (moderate): N18.3

## 2012-06-26 HISTORY — DX: Chronic kidney disease, stage 3 unspecified: N18.30

## 2012-06-26 LAB — BASIC METABOLIC PANEL
BUN: 22 mg/dL (ref 6–23)
CO2: 25 mEq/L (ref 19–32)
Chloride: 96 mEq/L (ref 96–112)
GFR calc Af Amer: 62 mL/min — ABNORMAL LOW (ref >90)
Potassium: 4.1 mEq/L (ref 3.5–5.1)

## 2012-06-26 LAB — GLUCOSE, CAPILLARY: Glucose-Capillary: 105 mg/dL — ABNORMAL HIGH (ref 70–99)

## 2012-06-26 LAB — MAGNESIUM: Magnesium: 1.3 mg/dL — ABNORMAL LOW (ref 1.5–2.5)

## 2012-06-26 SURGERY — ARTHROPLASTY, KNEE, TOTAL
Anesthesia: General | Site: Knee | Laterality: Left | Wound class: Clean

## 2012-06-26 MED ORDER — MENTHOL 3 MG MT LOZG: 1.0000 | LOZENGE | OROMUCOSAL | Status: DC | PRN

## 2012-06-26 MED ORDER — IRBESARTAN 300 MG PO TABS
300.0000 mg | ORAL_TABLET | Freq: Every day | ORAL | Status: DC
  Administered 2012-06-26: 300 mg via ORAL
  Filled 2012-06-26: qty 1

## 2012-06-26 MED ORDER — HYDROMORPHONE HCL PF 1 MG/ML IJ SOLN
INTRAMUSCULAR | Status: AC
  Filled 2012-06-26: qty 1

## 2012-06-26 MED ORDER — CEFUROXIME SODIUM 1.5 G IJ SOLR
INTRAMUSCULAR | Status: DC | PRN
  Administered 2012-06-26: 1.5 g

## 2012-06-26 MED ORDER — RIVAROXABAN 10 MG PO TABS
10.0000 mg | ORAL_TABLET | Freq: Every day | ORAL | Status: DC
  Administered 2012-06-27 – 2012-06-29 (×3): 10 mg via ORAL
  Filled 2012-06-26 (×4): qty 1

## 2012-06-26 MED ORDER — DEXTROSE 5 % IV SOLN
INTRAVENOUS | Status: DC | PRN
  Administered 2012-06-26: 10:00:00 via INTRAVENOUS

## 2012-06-26 MED ORDER — INSULIN ASPART 100 UNIT/ML ~~LOC~~ SOLN
0.0000 [IU] | Freq: Three times a day (TID) | SUBCUTANEOUS | Status: DC
  Administered 2012-06-27: 5 [IU] via SUBCUTANEOUS
  Administered 2012-06-27 (×2): 8 [IU] via SUBCUTANEOUS
  Administered 2012-06-28: 3 [IU] via SUBCUTANEOUS
  Administered 2012-06-28 (×2): 5 [IU] via SUBCUTANEOUS
  Administered 2012-06-29: 3 [IU] via SUBCUTANEOUS

## 2012-06-26 MED ORDER — METOCLOPRAMIDE HCL 5 MG PO TABS
5.0000 mg | ORAL_TABLET | Freq: Three times a day (TID) | ORAL | Status: DC | PRN
  Filled 2012-06-26: qty 2

## 2012-06-26 MED ORDER — HYDROMORPHONE HCL PF 1 MG/ML IJ SOLN: 0.2500 mg | INTRAMUSCULAR | Status: DC | PRN

## 2012-06-26 MED ORDER — ADULT MULTIVITAMIN W/MINERALS CH
1.0000 | ORAL_TABLET | Freq: Every day | ORAL | Status: DC
  Administered 2012-06-27 – 2012-06-29 (×3): 1 via ORAL
  Filled 2012-06-26 (×3): qty 1

## 2012-06-26 MED ORDER — LACTATED RINGERS IV SOLN
INTRAVENOUS | Status: DC | PRN
  Administered 2012-06-26 (×3): via INTRAVENOUS

## 2012-06-26 MED ORDER — CARVEDILOL 12.5 MG PO TABS
12.5000 mg | ORAL_TABLET | Freq: Two times a day (BID) | ORAL | Status: DC
  Administered 2012-06-27 – 2012-06-28 (×3): 12.5 mg via ORAL
  Filled 2012-06-26 (×5): qty 1

## 2012-06-26 MED ORDER — POTASSIUM CHLORIDE IN NACL 20-0.9 MEQ/L-% IV SOLN
INTRAVENOUS | Status: DC
Start: 2012-06-26 — End: 2012-06-29
  Filled 2012-06-26 (×9): qty 1000

## 2012-06-26 MED ORDER — ZOLPIDEM TARTRATE 5 MG PO TABS
5.0000 mg | ORAL_TABLET | Freq: Every evening | ORAL | Status: DC | PRN
  Administered 2012-06-26 – 2012-06-28 (×3): 5 mg via ORAL
  Filled 2012-06-26 (×3): qty 1

## 2012-06-26 MED ORDER — DEXAMETHASONE 6 MG PO TABS
10.0000 mg | ORAL_TABLET | Freq: Every day | ORAL | Status: AC
  Administered 2012-06-26 – 2012-06-28 (×3): 10 mg via ORAL
  Filled 2012-06-26 (×3): qty 1

## 2012-06-26 MED ORDER — FENTANYL CITRATE 0.05 MG/ML IJ SOLN
INTRAMUSCULAR | Status: AC
  Administered 2012-06-26: 100 ug
  Filled 2012-06-26: qty 2

## 2012-06-26 MED ORDER — ROPINIROLE HCL 1 MG PO TABS
1.0000 mg | ORAL_TABLET | Freq: Every day | ORAL | Status: DC
  Administered 2012-06-26 – 2012-06-28 (×3): 1 mg via ORAL
  Filled 2012-06-26 (×4): qty 1

## 2012-06-26 MED ORDER — PHENOL 1.4 % MT LIQD: 1.0000 | OROMUCOSAL | Status: DC | PRN

## 2012-06-26 MED ORDER — CENTRUM SILVER ULTRA MENS PO TABS: 1.0000 | ORAL_TABLET | Freq: Every morning | ORAL | Status: DC

## 2012-06-26 MED ORDER — DOCUSATE SODIUM 100 MG PO CAPS
100.0000 mg | ORAL_CAPSULE | Freq: Two times a day (BID) | ORAL | Status: DC
  Administered 2012-06-26 – 2012-06-28 (×5): 100 mg via ORAL
  Filled 2012-06-26 (×6): qty 1

## 2012-06-26 MED ORDER — IRBESARTAN 150 MG PO TABS
150.0000 mg | ORAL_TABLET | Freq: Every day | ORAL | Status: DC
  Administered 2012-06-27: 150 mg via ORAL
  Filled 2012-06-26 (×2): qty 1

## 2012-06-26 MED ORDER — SODIUM CHLORIDE 0.9 % IV BOLUS (SEPSIS)
500.0000 mL | Freq: Once | INTRAVENOUS | Status: AC
  Administered 2012-06-26: 500 mL via INTRAVENOUS

## 2012-06-26 MED ORDER — METOPROLOL TARTRATE 25 MG PO TABS
25.0000 mg | ORAL_TABLET | Freq: Once | ORAL | Status: AC
  Administered 2012-06-26: 25 mg via ORAL
  Filled 2012-06-26: qty 1

## 2012-06-26 MED ORDER — CARVEDILOL 6.25 MG PO TABS
6.2500 mg | ORAL_TABLET | Freq: Two times a day (BID) | ORAL | Status: DC
  Administered 2012-06-26: 6.25 mg via ORAL
  Filled 2012-06-26 (×2): qty 1

## 2012-06-26 MED ORDER — CEFAZOLIN SODIUM-DEXTROSE 2-3 GM-% IV SOLR
2.0000 g | Freq: Four times a day (QID) | INTRAVENOUS | Status: AC
  Administered 2012-06-26 (×2): 2 g via INTRAVENOUS
  Filled 2012-06-26 (×2): qty 50

## 2012-06-26 MED ORDER — ACETAMINOPHEN 650 MG RE SUPP: 650.0000 mg | Freq: Four times a day (QID) | RECTAL | Status: DC | PRN

## 2012-06-26 MED ORDER — POVIDONE-IODINE 7.5 % EX SOLN: Freq: Once | CUTANEOUS | Status: DC

## 2012-06-26 MED ORDER — PROPOFOL INFUSION 10 MG/ML OPTIME
INTRAVENOUS | Status: DC | PRN
  Administered 2012-06-26: 50 ug/kg/min via INTRAVENOUS

## 2012-06-26 MED ORDER — INSULIN GLARGINE 100 UNIT/ML ~~LOC~~ SOLN
41.0000 [IU] | Freq: Every day | SUBCUTANEOUS | Status: DC
  Administered 2012-06-27 – 2012-06-29 (×3): 41 [IU] via SUBCUTANEOUS

## 2012-06-26 MED ORDER — DEXAMETHASONE SODIUM PHOSPHATE 10 MG/ML IJ SOLN
10.0000 mg | Freq: Every day | INTRAMUSCULAR | Status: AC
  Filled 2012-06-26 (×3): qty 1

## 2012-06-26 MED ORDER — ALUM & MAG HYDROXIDE-SIMETH 200-200-20 MG/5ML PO SUSP: 30.0000 mL | ORAL | Status: DC | PRN

## 2012-06-26 MED ORDER — TELMISARTAN-HCTZ 80-25 MG PO TABS: 1.0000 | ORAL_TABLET | Freq: Every day | ORAL | Status: DC

## 2012-06-26 MED ORDER — ACETAMINOPHEN 10 MG/ML IV SOLN
1000.0000 mg | Freq: Four times a day (QID) | INTRAVENOUS | Status: AC
  Administered 2012-06-26 – 2012-06-27 (×4): 1000 mg via INTRAVENOUS
  Filled 2012-06-26 (×4): qty 100

## 2012-06-26 MED ORDER — OXYCODONE HCL 5 MG PO TABS: 5.0000 mg | ORAL_TABLET | ORAL | Status: DC | PRN

## 2012-06-26 MED ORDER — ALLOPURINOL 300 MG PO TABS
300.0000 mg | ORAL_TABLET | Freq: Every day | ORAL | Status: DC
  Administered 2012-06-26 – 2012-06-29 (×4): 300 mg via ORAL
  Filled 2012-06-26 (×4): qty 1

## 2012-06-26 MED ORDER — SODIUM CHLORIDE 0.9 % IR SOLN
Status: DC | PRN
  Administered 2012-06-26: 3000 mL

## 2012-06-26 MED ORDER — BUPIVACAINE-EPINEPHRINE PF 0.25-1:200000 % IJ SOLN
INTRAMUSCULAR | Status: AC
  Filled 2012-06-26: qty 30

## 2012-06-26 MED ORDER — CELECOXIB 200 MG PO CAPS
200.0000 mg | ORAL_CAPSULE | Freq: Two times a day (BID) | ORAL | Status: DC
  Administered 2012-06-26 – 2012-06-27 (×3): 200 mg via ORAL
  Filled 2012-06-26 (×6): qty 1

## 2012-06-26 MED ORDER — ACETAMINOPHEN 325 MG PO TABS: 650.0000 mg | ORAL_TABLET | Freq: Four times a day (QID) | ORAL | Status: DC | PRN

## 2012-06-26 MED ORDER — METOCLOPRAMIDE HCL 5 MG/ML IJ SOLN: 5.0000 mg | Freq: Three times a day (TID) | INTRAMUSCULAR | Status: DC | PRN

## 2012-06-26 MED ORDER — ONDANSETRON HCL 4 MG PO TABS: 4.0000 mg | ORAL_TABLET | Freq: Four times a day (QID) | ORAL | Status: DC | PRN

## 2012-06-26 MED ORDER — ONDANSETRON HCL 4 MG/2ML IJ SOLN
4.0000 mg | Freq: Four times a day (QID) | INTRAMUSCULAR | Status: DC | PRN
  Administered 2012-06-26: 4 mg via INTRAVENOUS
  Filled 2012-06-26: qty 2

## 2012-06-26 MED ORDER — PROPOFOL 10 MG/ML IV BOLUS
INTRAVENOUS | Status: DC | PRN
  Administered 2012-06-26: 20 mg via INTRAVENOUS
  Administered 2012-06-26: 30 mg via INTRAVENOUS

## 2012-06-26 MED ORDER — FENTANYL CITRATE 0.05 MG/ML IJ SOLN: 100.0000 ug | Freq: Once | INTRAMUSCULAR | Status: DC

## 2012-06-26 MED ORDER — BUPIVACAINE IN DEXTROSE 0.75-8.25 % IT SOLN
INTRATHECAL | Status: DC | PRN
  Administered 2012-06-26: 1 mL via INTRATHECAL

## 2012-06-26 MED ORDER — ATORVASTATIN CALCIUM 20 MG PO TABS
20.0000 mg | ORAL_TABLET | Freq: Every day | ORAL | Status: DC
  Administered 2012-06-26 – 2012-06-28 (×3): 20 mg via ORAL
  Filled 2012-06-26 (×4): qty 1

## 2012-06-26 MED ORDER — CEFUROXIME SODIUM 750 MG IJ SOLR
INTRAMUSCULAR | Status: AC
  Filled 2012-06-26: qty 1500

## 2012-06-26 MED ORDER — HYDROMORPHONE HCL PF 1 MG/ML IJ SOLN
0.5000 mg | INTRAMUSCULAR | Status: DC | PRN
  Administered 2012-06-26 (×2): 1 mg via INTRAVENOUS
  Filled 2012-06-26 (×2): qty 1

## 2012-06-26 MED ORDER — METOPROLOL TARTRATE 1 MG/ML IV SOLN
2.5000 mg | INTRAVENOUS | Status: DC | PRN
  Filled 2012-06-26: qty 5

## 2012-06-26 MED ORDER — PANTOPRAZOLE SODIUM 40 MG PO TBEC
40.0000 mg | DELAYED_RELEASE_TABLET | Freq: Every day | ORAL | Status: DC
  Administered 2012-06-27 – 2012-06-29 (×3): 40 mg via ORAL
  Filled 2012-06-26 (×3): qty 1

## 2012-06-26 MED ORDER — BUPIVACAINE-EPINEPHRINE 0.25% -1:200000 IJ SOLN
INTRAMUSCULAR | Status: DC | PRN
  Administered 2012-06-26: 50 mL

## 2012-06-26 MED ORDER — INSULIN ASPART 100 UNIT/ML ~~LOC~~ SOLN
0.0000 [IU] | Freq: Every day | SUBCUTANEOUS | Status: DC
  Administered 2012-06-26: 2 [IU] via SUBCUTANEOUS
  Administered 2012-06-27: 4 [IU] via SUBCUTANEOUS
  Administered 2012-06-28: 3 [IU] via SUBCUTANEOUS

## 2012-06-26 MED ORDER — DIPHENHYDRAMINE HCL 12.5 MG/5ML PO ELIX: 12.5000 mg | ORAL_SOLUTION | ORAL | Status: DC | PRN

## 2012-06-26 MED ORDER — ONDANSETRON HCL 4 MG/2ML IJ SOLN: 4.0000 mg | Freq: Once | INTRAMUSCULAR | Status: DC | PRN

## 2012-06-26 MED ORDER — LACTATED RINGERS IV SOLN
INTRAVENOUS | Status: DC
  Administered 2012-06-26: 08:00:00 via INTRAVENOUS

## 2012-06-26 MED ORDER — HYDROCHLOROTHIAZIDE 25 MG PO TABS
25.0000 mg | ORAL_TABLET | Freq: Every day | ORAL | Status: DC
  Administered 2012-06-26: 25 mg via ORAL
  Filled 2012-06-26: qty 1

## 2012-06-26 MED ORDER — PHENYLEPHRINE HCL 10 MG/ML IJ SOLN
INTRAMUSCULAR | Status: DC | PRN
  Administered 2012-06-26: 80 ug via INTRAVENOUS

## 2012-06-26 SURGICAL SUPPLY — 70 items
BANDAGE ESMARK 6X9 LF (GAUZE/BANDAGES/DRESSINGS) ×1 IMPLANT
BLADE SAGITTAL 25.0X1.19X90 (BLADE) ×2 IMPLANT
BLADE SAW SGTL 11.0X1.19X90.0M (BLADE) IMPLANT
BLADE SAW SGTL 13.0X1.19X90.0M (BLADE) ×2 IMPLANT
BLADE SURG 10 STRL SS (BLADE) ×4 IMPLANT
BNDG CMPR 9X6 STRL LF SNTH (GAUZE/BANDAGES/DRESSINGS) ×1
BNDG CMPR MED 15X6 ELC VLCR LF (GAUZE/BANDAGES/DRESSINGS) ×1
BNDG ELASTIC 6X15 VLCR STRL LF (GAUZE/BANDAGES/DRESSINGS) ×2 IMPLANT
BNDG ESMARK 6X9 LF (GAUZE/BANDAGES/DRESSINGS) ×2
BOWL SMART MIX CTS (DISPOSABLE) ×2 IMPLANT
CEMENT HV SMART SET (Cement) ×4 IMPLANT
CLOTH BEACON ORANGE TIMEOUT ST (SAFETY) ×2 IMPLANT
COVER BACK TABLE 24X17X13 BIG (DRAPES) IMPLANT
COVER SURGICAL LIGHT HANDLE (MISCELLANEOUS) ×2 IMPLANT
CUFF TOURNIQUET SINGLE 34IN LL (TOURNIQUET CUFF) ×2 IMPLANT
CUFF TOURNIQUET SINGLE 44IN (TOURNIQUET CUFF) IMPLANT
DRAPE EXTREMITY T 121X128X90 (DRAPE) ×2 IMPLANT
DRAPE INCISE IOBAN 66X45 STRL (DRAPES) ×2 IMPLANT
DRAPE PROXIMA HALF (DRAPES) ×2 IMPLANT
DRAPE U-SHAPE 47X51 STRL (DRAPES) ×2 IMPLANT
DRSG ADAPTIC 3X8 NADH LF (GAUZE/BANDAGES/DRESSINGS) ×2 IMPLANT
DRSG PAD ABDOMINAL 8X10 ST (GAUZE/BANDAGES/DRESSINGS) ×4 IMPLANT
DURAPREP 26ML APPLICATOR (WOUND CARE) ×2 IMPLANT
ELECT CAUTERY BLADE 6.4 (BLADE) ×3 IMPLANT
ELECT REM PT RETURN 9FT ADLT (ELECTROSURGICAL) ×2
ELECTRODE REM PT RTRN 9FT ADLT (ELECTROSURGICAL) ×1 IMPLANT
EVACUATOR 1/8 PVC DRAIN (DRAIN) IMPLANT
FACESHIELD LNG OPTICON STERILE (SAFETY) ×4 IMPLANT
GLOVE BIO SURGEON STRL SZ7 (GLOVE) ×2 IMPLANT
GLOVE BIOGEL PI IND STRL 6.5 (GLOVE) IMPLANT
GLOVE BIOGEL PI IND STRL 7.0 (GLOVE) ×1 IMPLANT
GLOVE BIOGEL PI IND STRL 7.5 (GLOVE) ×1 IMPLANT
GLOVE BIOGEL PI INDICATOR 6.5 (GLOVE) ×1
GLOVE BIOGEL PI INDICATOR 7.0 (GLOVE) ×3
GLOVE BIOGEL PI INDICATOR 7.5 (GLOVE) ×1
GLOVE SS BIOGEL STRL SZ 7.5 (GLOVE) ×1 IMPLANT
GLOVE SUPERSENSE BIOGEL SZ 7.5 (GLOVE) ×1
GOWN PREVENTION PLUS XLARGE (GOWN DISPOSABLE) ×4 IMPLANT
GOWN STRL NON-REIN LRG LVL3 (GOWN DISPOSABLE) ×6 IMPLANT
HANDPIECE INTERPULSE COAX TIP (DISPOSABLE) ×2
HOOD PEEL AWAY FACE SHEILD DIS (HOOD) ×5 IMPLANT
IMMOBILIZER KNEE 22 UNIV (SOFTGOODS) ×1 IMPLANT
INSERT CUSHION PRONEVIEW LG (MISCELLANEOUS) ×2 IMPLANT
KIT BASIN OR (CUSTOM PROCEDURE TRAY) ×2 IMPLANT
KIT ROOM TURNOVER OR (KITS) ×2 IMPLANT
MANIFOLD NEPTUNE II (INSTRUMENTS) ×2 IMPLANT
NS IRRIG 1000ML POUR BTL (IV SOLUTION) ×2 IMPLANT
PACK TOTAL JOINT (CUSTOM PROCEDURE TRAY) ×2 IMPLANT
PAD ARMBOARD 7.5X6 YLW CONV (MISCELLANEOUS) ×4 IMPLANT
PAD CAST 4YDX4 CTTN HI CHSV (CAST SUPPLIES) ×1 IMPLANT
PADDING CAST COTTON 4X4 STRL (CAST SUPPLIES) ×2
PADDING CAST COTTON 6X4 STRL (CAST SUPPLIES) ×2 IMPLANT
PENCIL BUTTON HOLSTER BLD 10FT (ELECTRODE) ×1 IMPLANT
POSITIONER HEAD PRONE TRACH (MISCELLANEOUS) ×2 IMPLANT
RUBBERBAND STERILE (MISCELLANEOUS) ×2 IMPLANT
SET HNDPC FAN SPRY TIP SCT (DISPOSABLE) ×1 IMPLANT
SPONGE GAUZE 4X4 12PLY (GAUZE/BANDAGES/DRESSINGS) ×2 IMPLANT
STRIP CLOSURE SKIN 1/2X4 (GAUZE/BANDAGES/DRESSINGS) ×2 IMPLANT
SUCTION FRAZIER TIP 10 FR DISP (SUCTIONS) ×2 IMPLANT
SUT ETHIBOND NAB CT1 #1 30IN (SUTURE) ×4 IMPLANT
SUT MNCRL AB 3-0 PS2 18 (SUTURE) ×2 IMPLANT
SUT VIC AB 0 CT1 27 (SUTURE) ×4
SUT VIC AB 0 CT1 27XBRD ANBCTR (SUTURE) ×2 IMPLANT
SUT VIC AB 2-0 CT1 27 (SUTURE) ×4
SUT VIC AB 2-0 CT1 TAPERPNT 27 (SUTURE) ×2 IMPLANT
SYR 30ML SLIP (SYRINGE) ×2 IMPLANT
TOWEL OR 17X24 6PK STRL BLUE (TOWEL DISPOSABLE) ×2 IMPLANT
TOWEL OR 17X26 10 PK STRL BLUE (TOWEL DISPOSABLE) ×2 IMPLANT
TRAY FOLEY CATH 14FR (SET/KITS/TRAYS/PACK) ×2 IMPLANT
WATER STERILE IRR 1000ML POUR (IV SOLUTION) ×6 IMPLANT

## 2012-06-26 NOTE — Op Note (Signed)
MRN:     [id number] DOB/AGE:    June 02, 1935 / 77 y.o.       OPERATIVE REPORT    DATE OF PROCEDURE:  06/26/2012       PREOPERATIVE DIAGNOSIS:   DJD LEFT KNEE       There is no height or weight on file to calculate BMI.                                                        POSTOPERATIVE DIAGNOSIS:   DJD LEFT KNEE                                                                       PROCEDURE:  Procedure(s): TOTAL KNEE ARTHROPLASTY Using Depuy Sigma RP implants #4 Femur, #4Tibia, 10mm sigma RP bearing, 35 Patella     SURGEON: Calia Napp A    ASSISTANT:  Kirstin Shepperson PA-C   (Present and scrubbed throughout the case, critical for assistance with exposure, retraction, instrumentation, and closure.)         ANESTHESIA: GET with Femoral Nerve Block  DRAINS: foley, 2 medium hemovac in knee   TOURNIQUET TIME:   COMPLICATIONS:  None     SPECIMENS: None   INDICATIONS FOR PROCEDURE: The patient has  DJD LEFT KNEE , varus deformities, XR shows bone on bone arthritis. Patient has failed all conservative measures including anti-inflammatory medicines, narcotics, attempts at  exercise and weight loss, cortisone injections and viscosupplementation.  Risks and benefits of surgery have been discussed, questions answered.   DESCRIPTION OF PROCEDURE: The patient identified by armband, received  right femoral nerve block and IV antibiotics, in the holding area at [facility]. Patient taken to the operating room, appropriate anesthetic  monitors were attached General endotracheal anesthesia induced with  the patient in supine position, Foley catheter was inserted. Tourniquet  applied high to the operative thigh. Lateral post and foot positioner  applied to the table, the lower extremity was then prepped and draped  in usual sterile fashion from the ankle to the tourniquet. Time-out procedure was performed. The limb was wrapped with an Esmarch bandage and the tourniquet inflated to  365 mmHg. We began the operation by making the anterior midline incision starting at handbreadth above the patella going over the patella 1 cm medial to and  4 cm distal to the tibial tubercle. Small bleeders in the skin and the  subcutaneous tissue identified and cauterized. Transverse retinaculum was incised and reflected medially and a medial parapatellar arthrotomy was accomplished. the patella was everted and theprepatellar fat pad resected. The superficial medial collateral  ligament was then elevated from anterior to posterior along the proximal  flare of the tibia and anterior half of the menisci resected. The knee was hyperflexed exposing bone on bone arthritis. Peripheral and notch osteophytes as well as the cruciate ligaments were then resected. We continued to  work our way around posteriorly along the proximal tibia, and externally  rotated the tibia subluxing it out from underneath the femur. A McHale  retractor was placed through the notch  and a lateral Hohmann retractor  placed, and we then drilled through the proximal tibia in line with the  axis of the tibia followed by an intramedullary guide rod and 2-degree  posterior slope cutting guide. The tibial cutting guide was pinned into place  allowing resection of 4 mm of bone medially and about 6 mm of bone  laterally because of her varus deformity. Satisfied with the tibial resection, we then  entered the distal femur 2 mm anterior to the PCL origin with the  intramedullary guide rod and applied the distal femoral cutting guide  set at 11mm, with 5 degrees of valgus. This was pinned along the  epicondylar axis. At this point, the distal femoral cut was accomplished without difficulty. We then sized for a #4 femoral component and pinned the guide in 3 degrees of external rotation.The chamfer cutting guide was pinned into place. The anterior, posterior, and chamfer cuts were accomplished without difficulty followed by  the Sigma RP box  cutting guide and the box cut. We also removed posterior osteophytes from the posterior femoral condyles. At this  time, the knee was brought into full extension. We checked our  extension and flexion gaps and found them symmetric at 10mm.  The patella thickness measured at 22 mm. We set the cutting guide at 13 and removed the posterior 9.5-10 mm  of the patella sized for 35 button and drilled the lollipop. The knee  was then once again hyperflexed exposing the proximal tibia. We sized for a #4 tibial base plate, applied the smokestack and the conical reamer followed by the the Delta fin keel punch. We then hammered into place the Sigma RP trial femoral component, inserted a 10-mm trial bearing, trial patellar button, and took the knee through range of motion from 0-130 degrees. No thumb pressure was required for patellar  tracking. At this point, all trial components were removed, a double batch of DePuy HV cement with 1500 mg of Zinacef was mixed and applied to all bony metallic mating surfaces except for the posterior condyles of the femur itself. In order, we  hammered into place the tibial tray and removed excess cement, the femoral component and removed excess cement, a 10-mm Sigma RP bearing  was inserted, and the knee brought to full extension with compression.  The patellar button was clamped into place, and excess cement  removed. While the cement cured the wound was irrigated out with normal saline solution pulse lavage, and medium Hemovac drains were placed.. Ligament stability and patellar tracking were checked and found to be excellent. The tourniquet was then released and hemostasis was obtained with cautery. The parapatellar arthrotomy was closed with  #1 ethibond suture. The subcutaneous tissue with 0 and 2-0 undyed  Vicryl suture, and 4-0 Monocryl.. A dressing of Xeroform,  4 x 4, dressing sponges, Webril, and Ace wrap applied. Needle and sponge count were correct times 2.The patient  awakened, extubated, and taken to recovery room without difficulty. Vascular status was normal, pulses 2+ and symmetric.   Jaylean Buenaventura A 06/26/2012, 11:00 AM

## 2012-06-26 NOTE — Consult Note (Signed)
[facility]                                                                                    Patient Demographics  Howard Charles, is a 77 y.o. male  CSN: [id number]  MRN: [id number]  DOB - Oct 03, 1935  Admit Date - 06/26/2012  Outpatient Primary MD for the patient is No primary provider on file.   With History of -  Past Medical History  Diagnosis Date  . Diabetes mellitus type 2 in obese   . Diabetic neuropathy   . Hypertension   . GERD (gastroesophageal reflux disease)   . Gout   . Left knee DJD   . Right knee DJD   . PONV (postoperative nausea and vomiting)     H/O ....BUT LAST FEW TIMES---NONE  . Bilateral hearing loss     WEARS HEARING AIDS........RIGHT IS BETTER THEN LEFT  . Chronic renal insufficiency, stage III (moderate)     Creatinine 1.47      Past Surgical History  Procedure Date  . Knee arthroscopy 2008    left   . Knee arthroscopy 2012    left  . Knee arthroscopy 2002    right  . Hemorrhoid surgery   . Inner ear surgery   . Shoulder arthroscopy with rotator cuff repair 2007  . Suprapubic prostatectomy     19 YRS  AGO  . Cholecystectomy   . Tonsillectomy     in for   No chief complaint on file.    HPI  Howard Charles  is a 76 y.o. male, with past medical history significant for hypertension, diabetes mellitus, GERD was admitted originally for left total knee replacement. During his surgery the patient had new onset atrial fib. Postop the patient started having runs of tachycardia reaching 130 to 140's. I reviewed the strips and they were normal sinus rhythm and sinus tach with PACs. EKG is pending. The patient is completely asymptomatic with no chest pains or shortness of breath, and had a normal workup with a normal stress test a few months ago by Dr. Lennox Solders.    Review of Systems    In addition to the HPI above,  No Fever-chills, No Headache, No changes with Vision or hearing, No problems swallowing food or Liquids, No  Chest pain, Cough or Shortness of Breath, No Abdominal pain, No Nausea or Vommitting, Bowel movements are regular, No Blood in stool or Urine, No dysuria, No new skin rashes or bruises, No new joints pains-aches,  No new weakness, tingling, numbness in any extremity, No recent weight gain or loss, No polyuria, polydypsia or polyphagia, No significant Mental Stressors.  A full 10 point Review of Systems was done, except as stated above, all other Review of Systems were negative.   Social History History  Substance Use Topics  . Smoking status: Never Smoker   . Smokeless tobacco: Not on file  . Alcohol Use: No     Family History Family History  Problem Relation Age of Onset  . Hypertension Mother   . Diabetes Mother   . Heart disease Mother   . Heart disease Father   . Heart attack Father   .  Hypertension Father   . Diabetes Mellitus II Father      Prior to Admission medications   Medication Sig Start Date End Date Taking? Authorizing Provider  acetaminophen (TYLENOL) 325 MG tablet Take 650 mg by mouth every 6 (six) hours as needed. For pain/fever   Yes Historical Provider, MD  allopurinol (ZYLOPRIM) 300 MG tablet Take 300 mg by mouth daily.   Yes Historical Provider, MD  aspirin EC 81 MG tablet Take 81 mg by mouth daily.   Yes Historical Provider, MD  carvedilol (COREG) 6.25 MG tablet Take 6.25 mg by mouth 2 (two) times daily with a meal.   Yes Historical Provider, MD  Glucosamine-Chondroit-Vit C-Mn (GLUCOSAMINE CHONDR 1500 COMPLX PO) Take 1 tablet by mouth 2 (two) times daily.   Yes Historical Provider, MD  ibuprofen (ADVIL,MOTRIN) 200 MG tablet Take 200 mg by mouth every 6 (six) hours as needed. For pain   Yes Historical Provider, MD  insulin glargine (LANTUS) 100 UNIT/ML injection Inject 41 Units into the skin daily before breakfast.    Yes Historical Provider, MD  Omega-3 Fatty Acids (FISH OIL) 1000 MG CAPS Take 2 capsules by mouth daily.   Yes Historical Provider, MD    pantoprazole (PROTONIX) 40 MG tablet Take 40 mg by mouth daily.   Yes Historical Provider, MD  rOPINIRole (REQUIP) 1 MG tablet Take 1 mg by mouth at bedtime.   Yes Historical Provider, MD  rosuvastatin (CRESTOR) 10 MG tablet Take 10 mg by mouth daily.   Yes Historical Provider, MD  saxagliptin HCl (ONGLYZA) 5 MG TABS tablet Take 5 mg by mouth daily.   Yes Historical Provider, MD  telmisartan-hydrochlorothiazide (MICARDIS HCT) 80-25 MG per tablet Take 1 tablet by mouth daily.   Yes Historical Provider, MD  Multiple Vitamins-Minerals (CENTRUM SILVER ULTRA MENS PO) Take 1 tablet by mouth daily.    Historical Provider, MD    No Known Allergies  Physical Exam  Vitals  Blood pressure 104/70, pulse 91, temperature 98.2 F (36.8 C), temperature source Oral, resp. rate 18, height 5\' 10"  (1.778 m), weight 95.255 kg (210 lb), SpO2 97.00%.   1. General white male in no acute distress lying in bed very pleasant.  2. Normal affect and insight, Not Suicidal or Homicidal, Awake Alert, Oriented X 3.  3. No F.N deficits, ALL C.Nerves Intact, Strength 5/5 all 4 extremities, Sensation intact all 4 extremities, Plantars down going.  4. Ears and Eyes appear Normal, Conjunctivae clear, PERRLA. Moist Oral Mucosa.  5. Supple Neck, No JVD, No cervical lymphadenopathy appriciated, No Carotid Bruits.  6. Symmetrical Chest wall movement, Good air movement bilaterally, CTAB.  7. RRR, No Gallops, Rubs or Murmurs, No Parasternal Heave.  8. Positive Bowel Sounds, Abdomen Soft, Non tender, No organomegaly appriciated,No rebound -guarding or rigidity.  9.  No Cyanosis, Normal Skin Turgor, No Skin Rash or Bruise.  10. Good muscle tone,   11. No Palpable Lymph Nodes in Neck or Axillae  12. Extremities; status post left total knee replacement. Right leg in TED stockings  Data Review  CBC  Lab 06/20/12 0932  WBC 8.9  HGB 16.1  HCT 45.3  PLT 202  MCV 92.4  MCH 32.9  MCHC 35.5  RDW 13.8  LYMPHSABS  2.3  MONOABS 0.7  EOSABS 0.8*  BASOSABS 0.1  BANDABS --   ------------------------------------------------------------------------------------------------------------------  Chemistries   Lab 06/20/12 0932  NA 142  K 3.8  CL 105  CO2 27  GLUCOSE 127*  BUN 25*  CREATININE  1.43*  CALCIUM 10.1  MG --  AST 29  ALT 34  ALKPHOS 102  BILITOT 0.7   ------------------------------------------------------------------------------------------------------------------   -------------------------------------------------------------------------------------------------------------------   Cardiac Enzymes  Lab 06/26/12 1152  CKMB --  TROPONINI <0.30  MYOGLOBIN --       Imaging results:     My personal review of EKG: Pending  Personally reviewed Old Chart from this admission  Assessment & Plan  1. atrial fib, perioperative, new onset, probably anesthesia related 2. Tachycardia; multifactorial, probable dehydration or anesthesia related or both 3. Elevated creatinine probably due to dehydration  Plan IV fluids Check TSH Lopressor by mouth x one dose Lopressor IV when necessary Check echo Might benefit from a consult to Dr. Anselm Jungling.. in a.m. Check EKG Serial troponins Hold hydrochlorothiazide Decrease Avapro Increase Coreg   DVT Prophylaxis Xarelto  AM Labs Ordered, also please review Full Orders  Family Communication: Admission, patients condition and plan of care including tests being ordered have been discussed with the patient who indicate understanding and agree with the plan and Code Status.  Code Status full  Disposition Plan: Home when stable  Time spent in minutes : 42 minutes  Condition fair

## 2012-06-26 NOTE — Evaluation (Signed)
Physical Therapy Evaluation Patient Details Name: Howard Charles MRN: [id number] DOB: Nov 26, 1935 Today's Date: 06/26/2012 Time: 4098-1191 PT Time Calculation (min): 34 min  PT Assessment / Plan / Recommendation Clinical Impression  Patient is a 77 yo male s/p Lt. TKA.  Will benefit from acute PT to maximize independence prior to discharge home with wife.  Anticpate patient will progress well.  Recommend HHPT at discharge for continued therapy.    PT Assessment  Patient needs continued PT services    Follow Up Recommendations  Home health PT;Supervision/Assistance - 24 hour    Does the patient have the potential to tolerate intense rehabilitation      Barriers to Discharge None      Equipment Recommendations  None recommended by PT    Recommendations for Other Services     Frequency 7X/week    Precautions / Restrictions Precautions Precautions: Knee Precaution Booklet Issued: Yes (comment) Precaution Comments: Reviewed precautions with patient. Required Braces or Orthoses: Knee Immobilizer - Left Knee Immobilizer - Left: On except when in CPM;Discontinue once straight leg raise with < 10 degree lag Restrictions Weight Bearing Restrictions: Yes LLE Weight Bearing: Weight bearing as tolerated   Pertinent Vitals/Pain       Mobility  Bed Mobility Bed Mobility: Supine to Sit;Sitting - Scoot to Edge of Bed Supine to Sit: 4: Min assist;With rails;HOB flat Sitting - Scoot to Edge of Bed: 4: Min guard;With rail Details for Bed Mobility Assistance: Verbal cues for technique.  Assist to move LLE off of bed. Transfers Transfers: Sit to Stand;Stand to Sit Sit to Stand: 4: Min assist;With upper extremity assist;From bed Stand to Sit: With upper extremity assist;4: Min assist;With armrests;To chair/3-in-1 Details for Transfer Assistance: Verbal cues for hand placement and placement of LLE during transfers. Ambulation/Gait Ambulation/Gait Assistance: 4: Min assist Ambulation  Distance (Feet): 5 Feet Assistive device: Rolling walker Ambulation/Gait Assistance Details: Verbal cues for gait sequence and safe use of RW.  Patient able to take steps to walk to chair. Gait Pattern: Step-to pattern;Decreased stance time - left;Decreased step length - right;Antalgic;Trunk flexed Gait velocity: Slow gait speed       Exercises Total Joint Exercises Ankle Circles/Pumps: AROM;Both;10 reps;Supine   PT Diagnosis: Difficulty walking;Acute pain  PT Problem List: Decreased strength;Decreased range of motion;Decreased mobility;Decreased activity tolerance;Decreased knowledge of use of DME;Decreased knowledge of precautions;Pain PT Treatment Interventions: DME instruction;Gait training;Stair training;Functional mobility training;Therapeutic exercise;Patient/family education   PT Goals Acute Rehab PT Goals PT Goal Formulation: With patient Time For Goal Achievement: 07/03/12 Potential to Achieve Goals: Good Pt will go Supine/Side to Sit: Independently;with HOB 0 degrees PT Goal: Supine/Side to Sit - Progress: Goal set today Pt will go Sit to Supine/Side: Independently;with HOB 0 degrees PT Goal: Sit to Supine/Side - Progress: Goal set today Pt will go Sit to Stand: with supervision;with upper extremity assist PT Goal: Sit to Stand - Progress: Goal set today Pt will Ambulate: >150 feet;with supervision;with rolling walker PT Goal: Ambulate - Progress: Goal set today Pt will Go Up / Down Stairs: 1-2 stairs;with min assist;with least restrictive assistive device PT Goal: Up/Down Stairs - Progress: Goal set today Pt will Perform Home Exercise Program: Independently PT Goal: Perform Home Exercise Program - Progress: Goal set today  Visit Information  Last PT Received On: 06/26/12 Assistance Needed: +1    Subjective Data  Subjective: "I'm glad you came.  I wanted to get up today" Patient Stated Goal: To go home tomorrow.   Prior Functioning  Home Living  Lives With:  Spouse Available Help at Discharge: Family;Available 24 hours/day Type of Home: House Home Access: Stairs to enter [employer] of Steps: 1 Entrance Stairs-Rails: None Home Layout: Two level;Able to live on main level with bedroom/bathroom Bathroom Shower/Tub: Other (comment) (Half-bath on main floor - will take sponge baths) Bathroom Toilet: Standard Bathroom Accessibility: Yes How Accessible: Accessible via walker Home Adaptive Equipment: Bedside commode/3-in-1;Shower chair with back;Straight cane;Walker - rolling Prior Function Level of Independence: Independent Able to Take Stairs?: Yes Driving: Yes Vocation: Retired Musician: No difficulties    Cognition  Overall Cognitive Status: Appears within functional limits for tasks assessed/performed Arousal/Alertness: Awake/alert Orientation Level: Oriented X4 / Intact Behavior During Session: [facility] for tasks performed    Extremity/Trunk Assessment Right Upper Extremity Assessment RUE ROM/Strength/Tone: WFL for tasks assessed RUE Sensation: WFL - Light Touch Left Upper Extremity Assessment LUE ROM/Strength/Tone: WFL for tasks assessed LUE Sensation: WFL - Light Touch Right Lower Extremity Assessment RLE ROM/Strength/Tone: WFL for tasks assessed RLE Sensation: History of peripheral neuropathy Left Lower Extremity Assessment LLE ROM/Strength/Tone: Deficits;Unable to fully assess;Due to pain LLE ROM/Strength/Tone Deficits: Able to assist moving LLE off of bed. LLE Sensation: History of peripheral neuropathy Trunk Assessment Trunk Assessment: Normal   Balance Balance Balance Assessed: Yes Static Sitting Balance Static Sitting - Balance Support: No upper extremity supported;Feet supported Static Sitting - Level of Assistance: 5: Stand by assistance Static Sitting - Comment/# of Minutes: 10  End of Session PT - End of Session Equipment Utilized During Treatment: Gait belt;Left knee  immobilizer Activity Tolerance: Patient tolerated treatment well Patient left: in chair;with call bell/phone within reach Nurse Communication: Mobility status CPM Left Knee CPM Left Knee: Off  GP     Vena Austria 06/26/2012, 6:58 PM Durenda Hurt. Renaldo Fiddler, [facility] Acute Rehab Services Pager [phone]

## 2012-06-26 NOTE — Progress Notes (Signed)
Orthopedic Tech Progress Note Patient Details:  Howard Charles 10-30-1935 [id number] CPM applied to Left LE with appropriate settings. OHF applied to bed.  CPM Left Knee CPM Left Knee: On Left Knee Flexion (Degrees): 60  Left Knee Extension (Degrees): 0    [name] 06/26/2012, 12:53 PM

## 2012-06-26 NOTE — Interval H&P Note (Signed)
History and Physical Interval Note:  06/26/2012 8:46 AM  Howard Charles  has presented today for surgery, with the diagnosis of DJD LEFT KNEE   The various methods of treatment have been discussed with the patient and family. After consideration of risks, benefits and other options for treatment, the patient has consented to  Procedure(s) (LRB) with comments: TOTAL KNEE ARTHROPLASTY (Left) as a surgical intervention .  The patient's history has been reviewed, patient examined, no change in status, stable for surgery.  I have reviewed the patient's chart and labs.  Questions were answered to the patient's satisfaction.     Salvatore Marvel A

## 2012-06-26 NOTE — Anesthesia Preprocedure Evaluation (Addendum)
Anesthesia Evaluation  Patient identified by MRN, date of birth, ID band Patient awake and Patient confused    Reviewed: Allergy & Precautions, H&P , NPO status , Patient's Chart, lab work & pertinent test results, reviewed documented beta blocker date and time   History of Anesthesia Complications (+) PONV  Airway Mallampati: I TM Distance: >3 FB Neck ROM: full    Dental  (+) Teeth Intact and Dental Advisory Given   Pulmonary COPD         Cardiovascular hypertension, Pt. on home beta blockers and Pt. on medications Rhythm:regular Rate:Normal     Neuro/Psych PSYCHIATRIC DISORDERS  Neuromuscular disease    GI/Hepatic GERD-  Controlled and Medicated,  Endo/Other  diabetes, Type 2, Insulin Dependent and Oral Hypoglycemic Agents  Renal/GU      Musculoskeletal   Abdominal   Peds  Hematology   Anesthesia Other Findings Alzheimers  Reproductive/Obstetrics                         Anesthesia Physical Anesthesia Plan  ASA: III  Anesthesia Plan: Regional and Spinal   Post-op Pain Management:    Induction: Intravenous  Airway Management Planned:   Additional Equipment:   Intra-op Plan:   Post-operative Plan:   Informed Consent:   Plan Discussed with: CRNA, Anesthesiologist and Surgeon  Anesthesia Plan Comments:         Anesthesia Quick Evaluation

## 2012-06-26 NOTE — Progress Notes (Signed)
UR COMPLETED  

## 2012-06-26 NOTE — Transfer of Care (Signed)
Immediate Anesthesia Transfer of Care Note  Patient: Howard Charles  Procedure(s) Performed: Procedure(s) (LRB) with comments: TOTAL KNEE ARTHROPLASTY (Left)  Patient Location: PACU  Anesthesia Type:  Spinal  Level of Consciousness: awake, alert , oriented and patient cooperative  Airway & Oxygen Therapy: Patient Spontanous Breathing and Patient connected to nasal cannula oxygen  Post-op Assessment: Report given to PACU RN and Post -op Vital signs reviewed and stable  Post vital signs: Reviewed and stable  Complications: No apparent anesthesia complications

## 2012-06-26 NOTE — Preoperative (Signed)
Beta Blockers   Reason not to administer Beta Blockers:Coreg 6 am today

## 2012-06-26 NOTE — Anesthesia Procedure Notes (Addendum)
Anesthesia Regional Block:  Femoral nerve block  Pre-Anesthetic Checklist: ,, timeout performed, Correct Patient, Correct Site, Correct Laterality, Correct Procedure, Correct Position, site marked, Risks and benefits discussed,  Surgical consent,  Pre-op evaluation,  At surgeon's request and post-op pain management  Laterality: Left  Prep: Maximum Sterile Barrier Precautions used, chloraprep and alcohol swabs       Needles:  Injection technique: Single-shot  Needle Type: Stimulator Needle - 80        Needle insertion depth: 6 cm   Additional Needles:  Procedures: nerve stimulator Femoral nerve block  Nerve Stimulator or Paresthesia:  Response: 0.5 mA, 0.1 ms, 6 cm  Additional Responses:   Narrative:  Start time: 06/26/2012 8:35 AM End time: 06/26/2012 8:45 AM Injection made incrementally with aspirations every 5 mL.  Performed by: Personally  Anesthesiologist: Maren Beach MD  Additional Notes: Pt accepts procedure ( Fem N Block and Spinal Anesthesia ) and risks. Fem Block  20cc 0.5% Marcaine w/o epi w/o difficulty or discomfort. GES   Spinal  Patient location during procedure: OR Start time: 06/26/2012 9:30 AM End time: 06/26/2012 9:40 AM Reason for block: at surgeon's request Staffing Performed by: anesthesiologist  Preanesthetic Checklist Completed: patient identified, site marked, surgical consent, pre-op evaluation, timeout performed, IV checked, risks and benefits discussed and monitors and equipment checked Spinal Block Patient position: right lateral decubitus Prep: Betadine and site prepped and draped Patient monitoring: heart rate, cardiac monitor, continuous pulse ox and blood pressure Approach: left paramedian Location: L3-4 Injection technique: single-shot Needle Needle type: Quincke  Needle gauge: 22 G Needle length: 9 cm Assessment Sensory level: T10 Additional Notes L34 22ga L paramedial w/ mild difficulty CSF clear free flow.Pt  tolerated well. GES

## 2012-06-26 NOTE — H&P (View-Only) (Signed)
TOTAL KNEE ADMISSION H&P  Patient is being admitted for left total knee arthroplasty.  Subjective:  Chief Complaint:left knee pain.  HPI: Howard Charles, 77 y.o. male, has a history of pain and functional disability in the left knee due to arthritis and has failed non-surgical conservative treatments for greater than 12 weeks to includeNSAID's and/or analgesics, corticosteriod injections, flexibility and strengthening excercises, supervised PT with diminished ADL's post treatment, use of assistive devices and activity modification.  Onset of symptoms was gradual, starting 6 years ago with gradually worsening course since that time. The patient noted prior procedures on the knee to include  arthroscopy and menisectomy on the left knee(s).  Patient currently rates pain in the left knee(s) at 8 out of 10 with activity. Patient has night pain, worsening of pain with activity and weight bearing, pain that interferes with activities of daily living, crepitus and joint swelling.  Patient has evidence of subchondral sclerosis, periarticular osteophytes and joint space narrowing by imaging studies.  There is no active infection.  Patient Active Problem List   Diagnosis Date Noted  . ALZHEIMER'S DISEASE 01/15/2007  . COPD 01/15/2007   Past Medical History  Diagnosis Date  . Diabetes mellitus type 2 in obese   . Diabetic neuropathy   . Hypertension   . GERD (gastroesophageal reflux disease)   . Gout   . Left knee DJD   . Right knee DJD     Past Surgical History  Procedure Date  . Knee arthroscopy 2008    left   . Knee arthroscopy 2012    left  . Knee arthroscopy 2002    right  . Hemorrhoid surgery   . Inner ear surgery   . Shoulder arthroscopy with rotator cuff repair 2007     (Not in a hospital admission) No Known Allergies  History  Substance Use Topics  . Smoking status: Never Smoker   . Smokeless tobacco: Not on file  . Alcohol Use: No    Family History  Problem Relation Age  of Onset  . Hypertension Mother   . Diabetes Mother   . Heart disease Mother   . Heart disease Father   . Heart attack Father   . Hypertension Father   . Diabetes Mellitus II Father      Review of Systems  Constitutional: Negative.   HENT: Negative.        Hard of hearing   Hearing aids Visual deficit corrected by glasses  Eyes: Negative.   Respiratory: Negative.   Cardiovascular: Negative.   Gastrointestinal: Negative.   Genitourinary: Negative.   Musculoskeletal: Positive for joint pain.       Bilateral knee left worse than right  Skin: Negative.   Neurological: Negative.   Endo/Heme/Allergies: Negative.   Psychiatric/Behavioral: Negative.     Objective:  Physical Exam  Constitutional: He is oriented to person, place, and time. He appears well-developed and well-nourished.  HENT:  Head: Normocephalic and atraumatic.  Eyes: EOM are normal. Pupils are equal, round, and reactive to light.  Neck: Neck supple.  Cardiovascular: Normal rate.   Respiratory: Effort normal and breath sounds normal.  GI: Soft. Bowel sounds are normal.  Genitourinary:       Not pertinent to current symptomatology therefore not examined.  Musculoskeletal:       Examination of his left knee reveals pain medially and laterally.  1+ crepitation.  1+ synovitis.  Range of motion -5 to 125 degrees.  Knee is stable with normal patella tracking and   diffuse pain.  Examination of his right knee reveals 1+ crepitation.  1+ synovitis.  Range of motion -5 to 125 degrees.  Mild varus deformity.  Knee is stable to ligamentous exam with normal patella tracking.  Vascular exam: Pulses are 2+ and symmetric.    Neurological: He is alert and oriented to person, place, and time.  Skin: Skin is warm and dry.  Psychiatric: He has a normal mood and affect.    Vital signs in last 24 hours: . Last recorded: 01/15 1500   BP: 118/75 Pulse: 69  Temp: 97.5 F (36.4 C) Resp: 12  Height: 5' 9" (1.753 m) SpO2: 95    Weight: 97.07 kg (214 lb)     Labs:   Estimated Body mass index is 31.60 kg/(m^2) as calculated from the following:   Height as of this encounter: 5' 9"(1.753 m).   Weight as of this encounter: 214 lb(97.07 kg).   Imaging Review Plain radiographs demonstrate severe degenerative joint disease of the left knee(s). The overall alignment ismild valgus. The bone quality appears to be good for age and reported activity level.  Assessment/Plan:  End stage arthritis, left knee   The patient history, physical examination, clinical judgment of the provider and imaging studies are consistent with end stage degenerative joint disease of the left knee(s) and total knee arthroplasty is deemed medically necessary. The treatment options including medical management, injection therapy arthroscopy and arthroplasty were discussed at length. The risks and benefits of total knee arthroplasty were presented and reviewed. The risks due to aseptic loosening, infection, stiffness, patella tracking problems, thromboembolic complications and other imponderables were discussed. The patient acknowledged the explanation, agreed to proceed with the plan and consent was signed. Patient is being admitted for inpatient treatment for surgery, pain control, PT, OT, prophylactic antibiotics, VTE prophylaxis, progressive ambulation and ADL's and discharge planning. The patient is planning to be discharged home with home health services  Janina Trafton A. Salif Tay, PA-C Physician Assistant [facility] [phone]  06/14/2012, 4:52 PM   

## 2012-06-26 NOTE — Progress Notes (Signed)
Patient ID: Howard Charles, male   DOB: 01-06-1936, 77 y.o.   MRN: [id number] 06/26/2012    Intraop patient had a few runs of tachycardia (SVT vs Afib).  I have ordered a STAT EKG, trop I levels, and consulted Dr Jacinto Halim, the patient's cardiologist  Madie Cahn A. Gwinda Passe Physician Assistant [facility] [phone]  06/26/2012, 11:48 AM

## 2012-06-26 NOTE — Anesthesia Postprocedure Evaluation (Signed)
  Anesthesia Post-op Note  Patient: Howard Charles  Procedure(s) Performed: Procedure(s) (LRB) with comments: TOTAL KNEE ARTHROPLASTY (Left)  Patient Location: PACU  Anesthesia Type:Spinal  Level of Consciousness: awake, alert , oriented and patient cooperative  Airway and Oxygen Therapy: Patient Spontanous Breathing  Post-op Pain: none  Post-op Assessment: Post-op Vital signs reviewed, Patient's Cardiovascular Status Stable, Respiratory Function Stable, Patent Airway, No signs of Nausea or vomiting and Pain level controlled  Post-op Vital Signs: stable  Complications: No apparent anesthesia complications

## 2012-06-27 ENCOUNTER — Encounter (HOSPITAL_COMMUNITY): Payer: Self-pay | Admitting: General Practice

## 2012-06-27 LAB — BASIC METABOLIC PANEL
BUN: 24 mg/dL — ABNORMAL HIGH (ref 6–23)
CO2: 22 mEq/L (ref 19–32)
Chloride: 98 mEq/L (ref 96–112)
Creatinine, Ser: 1.37 mg/dL — ABNORMAL HIGH (ref 0.50–1.35)
GFR calc Af Amer: 57 mL/min — ABNORMAL LOW (ref >90)
Potassium: 4 mEq/L (ref 3.5–5.1)

## 2012-06-27 LAB — CBC
HCT: 38.6 % — ABNORMAL LOW (ref 39.0–52.0)
Hemoglobin: 13.3 g/dL (ref 13.0–17.0)
MCV: 90.6 fL (ref 78.0–100.0)
RBC: 4.26 MIL/uL (ref 4.22–5.81)
RDW: 13.7 % (ref 11.5–15.5)
WBC: 14.3 10*3/uL — ABNORMAL HIGH (ref 4.0–10.5)

## 2012-06-27 LAB — TROPONIN I: Troponin I: <0.3 ng/mL (ref <0.30)

## 2012-06-27 LAB — GLUCOSE, CAPILLARY: Glucose-Capillary: 110 mg/dL — ABNORMAL HIGH (ref 70–99)

## 2012-06-27 LAB — HEMOGLOBIN A1C: Mean Plasma Glucose: 157 mg/dL — ABNORMAL HIGH (ref <117)

## 2012-06-27 MED ORDER — MAGNESIUM SULFATE 40 MG/ML IJ SOLN
4.0000 g | Freq: Once | INTRAMUSCULAR | Status: AC
  Administered 2012-06-27: 4 g via INTRAVENOUS
  Filled 2012-06-27: qty 100

## 2012-06-27 MED ORDER — MAGNESIUM SULFATE 40 MG/ML IJ SOLN
2.0000 g | Freq: Once | INTRAMUSCULAR | Status: DC
  Filled 2012-06-27: qty 50

## 2012-06-27 MED ORDER — DILTIAZEM HCL ER COATED BEADS 120 MG PO CP24
120.0000 mg | ORAL_CAPSULE | Freq: Two times a day (BID) | ORAL | Status: DC
  Administered 2012-06-27 – 2012-06-29 (×5): 120 mg via ORAL
  Filled 2012-06-27 (×7): qty 1

## 2012-06-27 NOTE — Consult Note (Signed)
CARDIOLOGY CONSULT NOTE  Patient ID: Howard Charles MRN: [id number] DOB/AGE: May 22, 1936 77 y.o.  Admit date: 06/26/2012 Referring Physician  Dr. Salvatore Marvel Primary Physician:  No primary provider on file. Dr. Merri Brunette Reason for Consultation  Arrhythmias  HPI: Howard Charles is a 77 year old Caucasian male with history of hypertension, hyperlipidemia and diabetes mellitus who recently underwent left knee replacement.  He has had a stress test on 02/25/2012 which had revealed normal perfusion imaging without any evidence of ischemia with a normal left ventricle systolic function.  He has had an echocardiogram also in January 2013, revealing normal left ventricle systolic function and mild left medical hypertrophy.  He had been doing well however postoperatively developed rapid heartbeat and I was consulted regarding this.  Patient was completely asymptomatic.  Patient was also told that he has had rapid heartbeats while he was on telemetry doing his physical therapy. He does not appreciate any palpitations, states that he does not have any shortness of breath except for baseline shortness of breath, he denies any chest pain, no symptoms to suggest TIA or stroke in the past.  His main complaint today is pain at the postoperative site but states that he is currently Well and is inclined to resume rehabilitation.  Past Medical History  Diagnosis Date  . Diabetes mellitus type 2 in obese   . Diabetic neuropathy   . Hypertension   . GERD (gastroesophageal reflux disease)   . Gout   . Left knee DJD   . Right knee DJD   . PONV (postoperative nausea and vomiting)     H/O ....BUT LAST FEW TIMES---NONE  . Bilateral hearing loss     WEARS HEARING AIDS........RIGHT IS BETTER THEN LEFT  . Chronic renal insufficiency, stage III (moderate)     Creatinine 1.47     Past Surgical History  Procedure Date  . Knee arthroscopy 2008    left   . Knee arthroscopy 2012    left  . Knee arthroscopy 2002      right  . Hemorrhoid surgery   . Inner ear surgery   . Shoulder arthroscopy with rotator cuff repair 2007  . Suprapubic prostatectomy     19 YRS  AGO  . Cholecystectomy   . Tonsillectomy   . Total knee arthroplasty 06/26/2012    LEFT KNEE  . Total knee arthroplasty 06/26/2012    Procedure: TOTAL KNEE ARTHROPLASTY;  Surgeon: Nilda Simmer, MD;  Location: [facility];  Service: Orthopedics;  Laterality: Left;     Family History  Problem Relation Age of Onset  . Hypertension Mother   . Diabetes Mother   . Heart disease Mother   . Heart disease Father   . Heart attack Father   . Hypertension Father   . Diabetes Mellitus II Father      Social History: History   Social History  . Marital Status: Married    Spouse Name: N/A    Number of Children: N/A  . Years of Education: N/A   Occupational History  . Not on file.   Social History Main Topics  . Smoking status: Never Smoker   . Smokeless tobacco: Never Used  . Alcohol Use: No  . Drug Use: No  . Sexually Active: Not on file   Other Topics Concern  . Not on file   Social History Narrative  . No narrative on file     Prescriptions prior to admission  Medication Sig Dispense Refill  . acetaminophen (TYLENOL)  325 MG tablet Take 650 mg by mouth every 6 (six) hours as needed. For pain/fever      . allopurinol (ZYLOPRIM) 300 MG tablet Take 300 mg by mouth daily.      Marland Kitchen aspirin EC 81 MG tablet Take 81 mg by mouth daily.      . carvedilol (COREG) 6.25 MG tablet Take 6.25 mg by mouth 2 (two) times daily with a meal.      . Glucosamine-Chondroit-Vit C-Mn (GLUCOSAMINE CHONDR 1500 COMPLX PO) Take 1 tablet by mouth 2 (two) times daily.      Marland Kitchen ibuprofen (ADVIL,MOTRIN) 200 MG tablet Take 200 mg by mouth every 6 (six) hours as needed. For pain      . insulin glargine (LANTUS) 100 UNIT/ML injection Inject 41 Units into the skin daily before breakfast.       . Omega-3 Fatty Acids (FISH OIL) 1000 MG CAPS Take 2 capsules by mouth daily.       . pantoprazole (PROTONIX) 40 MG tablet Take 40 mg by mouth daily.      Marland Kitchen rOPINIRole (REQUIP) 1 MG tablet Take 1 mg by mouth at bedtime.      . rosuvastatin (CRESTOR) 10 MG tablet Take 10 mg by mouth daily.      . saxagliptin HCl (ONGLYZA) 5 MG TABS tablet Take 5 mg by mouth daily.      Marland Kitchen telmisartan-hydrochlorothiazide (MICARDIS HCT) 80-25 MG per tablet Take 1 tablet by mouth daily.      . Multiple Vitamins-Minerals (CENTRUM SILVER ULTRA MENS PO) Take 1 tablet by mouth daily.        Scheduled Meds:   . allopurinol  300 mg Oral Daily  . atorvastatin  20 mg Oral q1800  . carvedilol  12.5 mg Oral BID WC  . celecoxib  200 mg Oral Q12H  . dexamethasone  10 mg Oral Daily   Or  . dexamethasone  10 mg Intravenous Daily  . diltiazem  120 mg Oral BID  . docusate sodium  100 mg Oral BID  . fentaNYL  100 mcg Intravenous Once  . insulin aspart  0-15 Units Subcutaneous TID WC  . insulin aspart  0-5 Units Subcutaneous QHS  . insulin glargine  41 Units Subcutaneous QAC breakfast  . irbesartan  150 mg Oral Daily  . multivitamin with minerals  1 tablet Oral Daily  . pantoprazole  40 mg Oral Daily  . rivaroxaban  10 mg Oral Q breakfast  . rOPINIRole  1 mg Oral QHS   Continuous Infusions:   . 0.9 % NaCl with KCl 20 mEq / L     PRN Meds:.acetaminophen, acetaminophen, alum & mag hydroxide-simeth, diphenhydrAMINE, HYDROmorphone (DILAUDID) injection, menthol-cetylpyridinium, metoCLOPramide (REGLAN) injection, metoCLOPramide, metoprolol, ondansetron (ZOFRAN) IV, ondansetron, oxyCODONE, phenol, zolpidem  ROS: General: no fevers/chills/night sweats Eyes: no blurry vision, diplopia, or amaurosis ENT: no sore throat or hearing loss Resp: no cough, wheezing, or hemoptysis CV: no edema or palpitations GI: no abdominal pain, nausea, vomiting, diarrhea, or constipation GU: no dysuria, frequency, or hematuria Skin: no rash Neuro: no headache, numbness, tingling, or weakness of extremities Heme: no  bleeding, DVT, or easy bruising Endo: no polydipsia or polyuria    Physical Exam: Blood pressure 102/48, pulse 83, temperature 97.9 F (36.6 C), temperature source Oral, resp. rate 18, height 5\' 10"  (1.778 m), weight 95.255 kg (210 lb), SpO2 93.00%.   General appearance: alert, cooperative, appears stated age and moderately obese Neck: no adenopathy, no carotid bruit, no JVD, supple, symmetrical,  trachea midline and thyroid not enlarged, symmetric, no tenderness/mass/nodules Lungs: clear to auscultation bilaterally Heart: regular rate and rhythm, S1, S2 normal, no murmur, click, rub or gallop Abdomen: soft, non-tender; bowel sounds normal; no masses,  no organomegaly Extremities: no edema, redness or tenderness in the calves or thighs and right knee post op dressing noted. Neurologic: Grossly normal  Labs:   Lab Results  Component Value Date   WBC 14.3* 06/27/2012   HGB 13.3 06/27/2012   HCT 38.6* 06/27/2012   MCV 90.6 06/27/2012   PLT 220 06/27/2012    Lab 06/27/12 0555  NA 134*  K 4.0  CL 98  CO2 22  BUN 24*  CREATININE 1.37*  CALCIUM 9.0  PROT --  BILITOT --  ALKPHOS --  ALT --  AST --  GLUCOSE 258*   Lab Results  Component Value Date   TROPONINI <0.30 06/27/2012    Lipid Panel  No results found for this basename: chol, trig, hdl, cholhdl, vldl, ldlcalc    EKG: normal EKG, normal sinus rhythm, unchanged from previous tracings. Telemetry: Frequent episodes of paroxysmal atrial tachycardia versus what appears like atrial flutter with 21 conduction is evident.  Short bursts.  Patient spontaneously converts to sinus rhythm.  Patient asymptomatic during these episodes.  Episodes noted both at rest and with usual activity.  ASSESSMENT AND PLAN:   1.  Atrial tachycardia, probably atrial flutter with 2-1 conduction, paroxysmal episodes.  Patient asymptomatic. Echo 06/17/11: Normal LVEF, mild LVH. normal diastolic function. No signficant valvular abnormality. Exercise  sestamibi 02/25/12: Normal perfusion images. No ischemia. Normal LVEF. 2.  Postop left knee replacement 3.  Diabetes mellitus type 2 controlled 4.  Hypertension controlled 5.  Hyperlipidemia.  Recommendation: I have added Cardizem CD 120 mg by mouth twice a day along with Coreg to see if we can control his arrhythmia.  Previously he has never had documented atrial fibrillation or atrial flutter.  Agree with observing him for one more day in the telemetry before discharging him to rehabilitation.  He does need anticoagulation, however he is at a very high risk for hemarthrosis if he were to go full dose of Xarelto.  Hence continue 10 mg of Xarelto for now and I am sending him up for an office visit next week and if there is no bleeding complications from his surgery, I will consider full dose of anticoagulation.  However if he maintains sinus rhythm and there is no recurrence on diltiazem, we may consider to watch him and may consider 30 day event monitor.  I have discussed the findings this morning with Ms. Patrecia Pour, PA-C.    Pamella Pert, MD 06/27/2012, 6:35 PM [facility] Pager: [phone] Office: [phone] If no answer Cell [phone]

## 2012-06-27 NOTE — Progress Notes (Addendum)
Patient experiencing episodes of tachycardia in the 130-140's overnight as displayed on the telemetry monitor.  Patient without any complaints of chest pain, SOB, or dizziness.  Spoke with Dr. Alveda Reasons on call for Dr. Thurston Hole.  Dr. Alveda Reasons consulted triad hospitalists and Dr. Sharyon Medicus came to see patient.  Labs and EKG ordered, as well as a fluid bolus. Echo ordered for the am.  One time dose of metoprolol ordered as well as PRN metoprolol. Fluid bolus and metoprolol administered.  BP 110/70, HR was 87.  Magnesium level was low, spoke with Artist Beach NP on call for hospitalists, IV Mag ordered, and troponins ordered as well.  Patient without any further issue of tachycardia, HR running in the 80-90's.  Will continue to monitor.

## 2012-06-27 NOTE — Progress Notes (Signed)
Received a call from Ortho at 7 27 am from [phone] , that they wanted a cardiology consult from Dr Jacinto Halim for tachycardia, and new onset atrial fibrillation rather than a medical consult. Will sign off for now. Please call if needed.   Kathlen Mody, MD [phone].

## 2012-06-27 NOTE — Progress Notes (Signed)
Physical Therapy Treatment Patient Details Name: Howard Charles MRN: [id number] DOB: [birth year] Today's Date: 06/27/2012 Time: 4098-1191 PT Time Calculation (min): 30 min  PT Assessment / Plan / Recommendation Comments on Treatment Session  Continues to progress with therapy. No pain only mild discomfort with heel slides. Able to recall and demo stair techniques from morning session. Will progress stair training tomorrow with cane.    Follow Up Recommendations  Home health PT;Supervision/Assistance - 24 hour     Does the patient have the potential to tolerate intense rehabilitation     Barriers to Discharge        Equipment Recommendations  None recommended by PT    Recommendations for Other Services    Frequency 7X/week   Plan Discharge plan remains appropriate;Frequency remains appropriate    Precautions / Restrictions Precautions Precautions: Knee Required Braces or Orthoses: Knee Immobilizer - Left Knee Immobilizer - Left: Discontinue once straight leg raise with < 10 degree lag Restrictions LLE Weight Bearing: Weight bearing as tolerated   no apparent distress     Mobility  Bed Mobility Bed Mobility: Sit to Supine Supine to Sit: 5: Supervision;HOB elevated;With rails (with trapeze) Sit to Supine: 5: Supervision Details for Bed Mobility Assistance: supervision for safety Transfers Sit to Stand: 5: Supervision;With upper extremity assist;From bed Stand to Sit: 5: Supervision;With upper extremity assist;To bed Details for Transfer Assistance: Verbal cueing for backing to sit on bed Ambulation/Gait Ambulation/Gait Assistance: 5: Supervision Ambulation Distance (Feet): 180 Feet Assistive device: Rolling walker Ambulation/Gait Assistance Details: Cueing for RW management as pt tends place walker too far out causing increased trunk flexion Gait Pattern: Trunk flexed;Step-through pattern;Decreased stride length Gait velocity: decreased Stairs: Yes Stairs Assistance:  4: Min assist Stairs Assistance Details (indicate cue type and reason): Cueing to recall gait sequence from previous treatment. Assistance for safety. Will attempt rail and cane next session. Stair Management Technique: Forwards;With walker Number of Stairs: 1  (x2)    Exercises Total Joint Exercises Ankle Circles/Pumps: AROM;Both;15 reps Quad Sets: AROM;Left;10 reps Short Arc Quad: AROM;Left;10 reps Heel Slides: AROM;Left;10 reps;Limitations Hip ABduction/ADduction: AAROM;Left;10 reps Straight Leg Raises: AAROM;Left;10 reps   PT Diagnosis:    PT Problem List:   PT Treatment Interventions:     PT Goals Acute Rehab PT Goals PT Goal: Supine/Side to Sit - Progress: Progressing toward goal PT Goal: Sit to Supine/Side - Progress: Progressing toward goal PT Goal: Sit to Stand - Progress: Met PT Goal: Ambulate - Progress: Progressing toward goal PT Goal: Up/Down Stairs - Progress: Progressing toward goal PT Goal: Perform Home Exercise Program - Progress: Progressing toward goal  Visit Information  Last PT Received On: 06/27/12 Assistance Needed: +1    Subjective Data      Cognition  Overall Cognitive Status: Appears within functional limits for tasks assessed/performed Arousal/Alertness: Awake/alert Orientation Level: Appears intact for tasks assessed Behavior During Session: [facility] for tasks performed    Balance     End of Session PT - End of Session Equipment Utilized During Treatment: Gait belt;Left knee immobilizer Activity Tolerance: Patient tolerated treatment well Patient left: in bed;with call bell/phone within reach   GP     [name] 06/27/2012, 1:38 PM

## 2012-06-27 NOTE — Progress Notes (Signed)
Seen and agreed 06/27/2012 Fredrich Birks PTA [phone] pager [phone] office

## 2012-06-27 NOTE — Progress Notes (Signed)
Physical Therapy Treatment Patient Details Name: Howard Charles MRN: [id number] DOB: 09-16-1935 Today's Date: 06/27/2012 Time:  -     PT Assessment / Plan / Recommendation Comments on Treatment Session  Patient progressing well this morning. No complaints of chest pain or discomfort. Patient needing to attempt a flight of steps in order to make it to his bedroom however patient does have the option of staying on the main floor if needed.     Follow Up Recommendations  Home health PT;Supervision/Assistance - 24 hour     Does the patient have the potential to tolerate intense rehabilitation     Barriers to Discharge        Equipment Recommendations  None recommended by PT    Recommendations for Other Services    Frequency 7X/week   Plan Discharge plan remains appropriate;Frequency remains appropriate    Precautions / Restrictions Precautions Precautions: Knee Required Braces or Orthoses: Knee Immobilizer - Left Knee Immobilizer - Left: On except when in CPM;Discontinue once straight leg raise with < 10 degree lag Restrictions Weight Bearing Restrictions: Yes LLE Weight Bearing: Weight bearing as tolerated   Pertinent Vitals/Pain     Mobility  Bed Mobility Bed Mobility: Not assessed Transfers Sit to Stand: 5: Supervision;With upper extremity assist;From chair/3-in-1 Stand to Sit: 5: Supervision;With upper extremity assist;To chair/3-in-1 Details for Transfer Assistance: Cues for safe technique and to control descent into recliner Ambulation/Gait Ambulation/Gait Assistance: 5: Supervision Ambulation Distance (Feet): 180 Feet Assistive device: Rolling walker Ambulation/Gait Assistance Details: Cues for posture and for Rw management Gait Pattern: Step-through pattern;Decreased stride length Stairs: Yes Stairs Assistance: 4: Min assist Stairs Assistance Details (indicate cue type and reason): Cues for techique. A for balance and safety. Patient attempted 2 steps with rail  but will need to attempt rail and cane next session. Patient has flight of steps to get up to his bedroom but has bed on main floor if needed Stair Management Technique: Forwards;With walker Number of Stairs: 1  (practiced twice)    Exercises Total Joint Exercises Ankle Circles/Pumps: AROM;Both;15 reps Quad Sets: AROM;Left;15 reps Short Arc QuadBarbaraann Boys;Right;15 reps Heel Slides: AAROM;Right;15 reps Hip ABduction/ADduction: AAROM;Right;15 reps Straight Leg Raises: AAROM;Right;10 reps   PT Diagnosis:    PT Problem List:   PT Treatment Interventions:     PT Goals Acute Rehab PT Goals PT Goal: Sit to Stand - Progress: Met PT Goal: Ambulate - Progress: Progressing toward goal PT Goal: Up/Down Stairs - Progress: Progressing toward goal PT Goal: Perform Home Exercise Program - Progress: Progressing toward goal  Visit Information  Last PT Received On: 06/27/12 Assistance Needed: +1    Subjective Data      Cognition  Overall Cognitive Status: Appears within functional limits for tasks assessed/performed Arousal/Alertness: Awake/alert Orientation Level: Appears intact for tasks assessed Behavior During Session: [facility] for tasks performed    Balance     End of Session PT - End of Session Equipment Utilized During Treatment: Gait belt;Left knee immobilizer Activity Tolerance: Patient tolerated treatment well Patient left: in chair;with call bell/phone within reach Nurse Communication: Mobility status CPM Left Knee CPM Left Knee: On Left Knee Flexion (Degrees): 60  Left Knee Extension (Degrees): 0    GP     Fredrich Birks 06/27/2012, 9:45 AM 06/27/2012 Fredrich Birks PTA [phone] pager [phone] office

## 2012-06-27 NOTE — Progress Notes (Signed)
Subjective: 1 Day Post-Op Procedure(s) (LRB): TOTAL KNEE ARTHROPLASTY (Left) Patient reports pain as 2 on 0-10 scale.    Objective: Vital signs in last 24 hours: Temp:  [97.2 F (36.2 C)-98.2 F (36.8 C)] 97.6 F (36.4 C) (01/28 0559) Pulse Rate:  [54-96] 83  (01/28 0559) Resp:  [11-24] 16  (01/28 0559) BP: (104-133)/(60-89) 125/71 mmHg (01/28 0559) SpO2:  [94 %-100 %] 97 % (01/28 0559) FiO2 (%):  [28 %] 28 % (01/27 1356) Weight:  [95.255 kg (210 lb)] 95.255 kg (210 lb) (01/27 1340)  Intake/Output from previous day: 01/27 0701 - 01/28 0700 In: 2170 [P.O.:120; I.V.:2050] Out: 1500 [Urine:1000; Drains:500] Intake/Output this shift:     Basename 06/27/12 0555  HGB 13.3    Basename 06/27/12 0555  WBC 14.3*  RBC 4.26  HCT 38.6*  PLT 220    Basename 06/27/12 0555 06/26/12 2222  NA 134* 132*  K 4.0 4.1  CL 98 96  CO2 22 25  BUN 24* 22  CREATININE 1.37* 1.28  GLUCOSE 258* 239*  CALCIUM 9.0 8.9   No results found for this basename: LABPT:2,INR:2 in the last 72 hours  ABD soft Neurovascular intact Sensation intact distally Intact pulses distally Dorsiflexion/Plantar flexion intact Incision: scant drainage cardiac arrythmia   intraop and overnight  Assessment/Plan: 1 Day Post-Op Procedure(s) (LRB): TOTAL KNEE ARTHROPLASTY (Left) Principal Problem:  *Atrial fibrillation Active Problems:  Alzheimer's disease  COPD  Cardiac arrhythmia  Left knee DJD  Diabetes mellitus type 2 in obese  Diabetic neuropathy  Hypertension  GERD (gastroesophageal reflux disease)  Gout  Bilateral hearing loss  Chronic renal insufficiency, stage III (moderate)  Advance diet Up with therapy Discharge home with home health  Waiting for cardiologist to see this patient.     Margarite Vessel J 06/27/2012, 9:04 AM

## 2012-06-28 LAB — BASIC METABOLIC PANEL
CO2: 24 mEq/L (ref 19–32)
Glucose, Bld: 223 mg/dL — ABNORMAL HIGH (ref 70–99)
Potassium: 4.5 mEq/L (ref 3.5–5.1)
Sodium: 135 mEq/L (ref 135–145)

## 2012-06-28 LAB — GLUCOSE, CAPILLARY
Glucose-Capillary: 176 mg/dL — ABNORMAL HIGH (ref 70–99)
Glucose-Capillary: 217 mg/dL — ABNORMAL HIGH (ref 70–99)
Glucose-Capillary: 204 mg/dL — ABNORMAL HIGH (ref 70–99)
Glucose-Capillary: 252 mg/dL — ABNORMAL HIGH (ref 70–99)

## 2012-06-28 LAB — MAGNESIUM: Magnesium: 2.3 mg/dL (ref 1.5–2.5)

## 2012-06-28 LAB — CBC
Hemoglobin: 10.3 g/dL — ABNORMAL LOW (ref 13.0–17.0)
RBC: 3.25 MIL/uL — ABNORMAL LOW (ref 4.22–5.81)

## 2012-06-28 MED ORDER — CARVEDILOL 6.25 MG PO TABS
6.2500 mg | ORAL_TABLET | Freq: Two times a day (BID) | ORAL | Status: DC
  Administered 2012-06-28 – 2012-06-29 (×2): 6.25 mg via ORAL
  Filled 2012-06-28 (×4): qty 1

## 2012-06-28 MED ORDER — IRBESARTAN 150 MG PO TABS
150.0000 mg | ORAL_TABLET | Freq: Every day | ORAL | Status: DC
  Administered 2012-06-28 – 2012-06-29 (×2): 150 mg via ORAL
  Filled 2012-06-28 (×2): qty 1

## 2012-06-28 NOTE — Progress Notes (Signed)
Seen and agreed 05/08/2013 Robinette, Julia Elizabeth PTA [phone] pager [phone] office    

## 2012-06-28 NOTE — Progress Notes (Signed)
CARE MANAGEMENT NOTE 06/28/2012  Patient:  Howard Charles, Howard Charles   Account Number:  [id number]  Date Initiated:  06/28/2012  Documentation initiated by:  Vance Peper  Subjective/Objective Assessment:   77 yr old male s/p left total knee arthroplasty.     Action/Plan:   CM spoke with patient concerning home health needs and DME. Choice given. Rolling walker, 3in1 and CPM have been delivered to the patient's home.   Anticipated DC Date:  06/29/2012   Anticipated DC Plan:  HOME W HOME HEALTH SERVICES      DC Planning Services  CM consult      [facility] Choice  HOME HEALTH   Choice offered to / List presented to:  C-1 Patient        HH arranged  HH-2 PT      [facility] agency  [facility]   Status of service:  Completed, signed off Medicare Important Message given?   (If response is "NO", the following Medicare IM given date fields will be blank) Date Medicare IM given:   Date Additional Medicare IM given:    Discharge Disposition:  HOME W HOME HEALTH SERVICES  Per UR Regulation:    If discussed at Long Length of Stay Meetings, dates discussed:    Comments:

## 2012-06-28 NOTE — Progress Notes (Signed)
Patient experienced two 4 beat runs of nonstained V-tach tonight just before 0200.  Vital signs 98.1-115/63-74-18-95% on room air.  Patient without any complaints of chest pain, SOB, dizziness, nausea or vomiting.  Patient resting in bed without distress.  Spoke with Dr. Jacinto Halim, patient's cardiologist.  Orders for a basic metabolic panel and a magnesium level in the morning.  He will come see him in the morning.  Will continue to monitor.

## 2012-06-28 NOTE — Evaluation (Signed)
Occupational Therapy Evaluation Patient Details Name: Howard Charles MRN: [id number] DOB: 1936/02/17 Today's Date: 06/28/2012 Time: 4098-1191 OT Time Calculation (min): 19 min  OT Assessment / Plan / Recommendation Clinical Impression  Pt doing well and requires no further acute or follow up OT at this time. Pt should continue with PT services for mobility . All education completed, OT will sign off    OT Assessment  Patient does not need any further OT services    Follow Up Recommendations  No OT follow up    Barriers to Discharge      Equipment Recommendations  None recommended by OT    Recommendations for Other Services    Frequency       Precautions / Restrictions Precautions Precautions: Knee Required Braces or Orthoses: Knee Immobilizer - Left Knee Immobilizer - Left: Discontinue once straight leg raise with < 10 degree lag Restrictions Weight Bearing Restrictions: Yes LLE Weight Bearing: Weight bearing as tolerated       ADL  Eating/Feeding: Performed;Set up Where Assessed - Eating/Feeding: Bed level Grooming: Performed;Wash/dry hands;Wash/dry face;Supervision/safety;Set up;Brushing hair Where Assessed - Grooming: Supported standing Upper Body Bathing: Simulated;Supervision/safety;Set up Where Assessed - Upper Body Bathing: Unsupported sitting Lower Body Bathing: Simulated;Min guard Where Assessed - Lower Body Bathing: Unsupported sitting;Supported standing Upper Body Dressing: Performed;Supervision/safety;Set up Where Assessed - Upper Body Dressing: Unsupported sitting Lower Body Dressing: Min guard;Performed Where Assessed - Lower Body Dressing: Unsupported sitting;Supported standing Toilet Transfer: Performed;Supervision/safety Toilet Transfer Method: Other (comment) (ambulating from RW level) Toileting - Clothing Manipulation and Hygiene: Performed;Min guard Where Assessed - Glass blower/designer Manipulation and Hygiene: Standing ADL Comments: pt will live  on main floor at home where thers is only a 1/2 bath, pt plans to sponge bathe unitl well enough t go upstairs for shiwers    OT Diagnosis:    OT Problem List:   OT Treatment Interventions:     OT Goals    Visit Information  Last OT Received On: 06/28/12 Assistance Needed: +1    Subjective Data  Subjective: " I may not go home til tomorrow because of heart issues " Patient Stated Goal: To return home   Prior Functioning     Home Living Lives With: Spouse Available Help at Discharge: Family;Available 24 hours/day Type of Home: House Home Access: Stairs to enter [employer] of Steps: 1 Entrance Stairs-Rails: None Home Layout: Two level;Able to live on main level with bedroom/bathroom;1/2 bath on main level Bathroom Shower/Tub: Tub/shower unit;Other (comment) (1/2 bath on main floor, plans to sponge bathe) Bathroom Toilet: Standard Bathroom Accessibility: Yes How Accessible: Accessible via walker Home Adaptive Equipment: Bedside commode/3-in-1;Shower chair with back;Straight cane;Tub transfer bench;Walker - rolling;Reacher;Long-handled sponge;Long-handled shoehorn;Sock aid Additional Comments: pt has ADL A/E at home and is familiar with use Prior Function Level of Independence: Independent Able to Take Stairs?: Yes Driving: Yes Vocation: Retired Musician: No difficulties Dominant Hand: Right         Vision/Perception Vision - Assessment Eye Alignment: Within Chemical engineer Perception: Within Functional Limits   Cognition  Overall Cognitive Status: Appears within functional limits for tasks assessed/performed Arousal/Alertness: Awake/alert Orientation Level: Appears intact for tasks assessed Behavior During Session: [facility] for tasks performed    Extremity/Trunk Assessment Right Upper Extremity Assessment RUE ROM/Strength/Tone: [facility] for tasks assessed Left Upper Extremity Assessment LUE ROM/Strength/Tone: [facility] for tasks  assessed     Mobility Bed Mobility Bed Mobility: Supine to Sit;Sitting - Scoot to Edge of Bed;Sit to Supine Supine to Sit: 5:  Supervision;HOB elevated;With rails Sitting - Scoot to Edge of Bed: 5: Supervision Sit to Supine: 5: Supervision Transfers Transfers: Sit to Stand;Stand to Sit Sit to Stand: From bed;From toilet;5: Supervision Stand to Sit: To toilet;To bed;5: Supervision             Balance Balance Balance Assessed: No   End of Session OT - End of Session Equipment Utilized During Treatment: Gait belt;Left knee immobilizer;Other (comment) (3 in 1, ADL A/E) Activity Tolerance: Patient tolerated treatment well Patient left: in bed;with call bell/phone within reach   GO     Howard Charles 06/28/2012, 12:09 PM

## 2012-06-28 NOTE — Progress Notes (Signed)
Inpatient Diabetes Program Recommendations  AACE/ADA: New Consensus Statement on Inpatient Glycemic Control (2013)  Target Ranges:  Prepandial:   less than 140 mg/dL      Peak postprandial:   less than 180 mg/dL (1-2 hours)      Critically ill patients:  140 - 180 mg/dL   Reason for Visit:Results for OREE, HISLOP (MRN [id number]) as of 06/28/2012 10:27  Ref. Range 06/27/2012 16:05 06/27/2012 21:40 06/28/2012 06:34  Glucose-Capillary Latest Range: 70-99 mg/dL 324 (H) 401 (H) 027 (H)    Please consider adding Novolog meal coverage 5 units tid with meals-Hold if patient eats less than 50%.

## 2012-06-28 NOTE — Progress Notes (Signed)
Subjective: 2 Days Post-Op Procedure(s) (LRB): TOTAL KNEE ARTHROPLASTY (Left) Patient reports pain as 2 on 0-10 scale.   Patient has not had pain med in over 24 hrs   He is doing very well with regards to his knee replacement.  He had 2 runs of Ventricular tachycardia last night while he was sleeping  Objective: Vital signs in last 24 hours: Temp:  [97.4 F (36.3 C)-98.1 F (36.7 C)] 97.5 F (36.4 C) (01/29 0600) Pulse Rate:  [74-83] 80  (01/29 0600) Resp:  [16-18] 18  (01/29 0600) BP: (102-119)/(48-63) 107/60 mmHg (01/29 0600) SpO2:  [93 %-95 %] 93 % (01/29 0600)  Intake/Output from previous day: 01/28 0701 - 01/29 0700 In: 220 [I.V.:220] Out: -  Intake/Output this shift:     Basename 06/27/12 0555  HGB 13.3    Basename 06/27/12 0555  WBC 14.3*  RBC 4.26  HCT 38.6*  PLT 220    Basename 06/27/12 0555 06/26/12 2222  NA 134* 132*  K 4.0 4.1  CL 98 96  CO2 22 25  BUN 24* 22  CREATININE 1.37* 1.28  GLUCOSE 258* 239*  CALCIUM 9.0 8.9   No results found for this basename: LABPT:2,INR:2 in the last 72 hours  ABD soft Neurovascular intact Sensation intact distally Intact pulses distally Dorsiflexion/Plantar flexion intact Incision: scant drainage  Assessment/Plan: 2 Days Post-Op Procedure(s) (LRB): TOTAL KNEE ARTHROPLASTY (Left) Principal Problem:  *Left knee DJD Active Problems:  Alzheimer's disease  COPD  Cardiac arrhythmia  has episodes of atrial flutter and runs of VTach  Diabetes mellitus type 2 in obese  Diabetic neuropathy  Hypertension  GERD (gastroesophageal reflux disease)  Gout  Bilateral hearing loss  Chronic renal insufficiency, stage III (moderate)   Will continue to monitor this patient due to his cardiac arrhythmias.  His knee is doing great Continue PT.  Home with home health when stable from a cardiac standpoint.  Rhyse Loux J 06/28/2012, 7:14 AM

## 2012-06-28 NOTE — Plan of Care (Signed)
Problem: Phase II Progression Outcomes Goal: Discharge plan established No further acute or follow up OT recommended at this time, no equipment recommended from OT at this time.

## 2012-06-28 NOTE — Progress Notes (Signed)
Subjective:  Patient is presently doing well and remains asymptomatic. Patient has had 6 beat wide-complex tachycardia which is consistent with either A. flutter with aberrancy versus nonsustained mental tachycardia. He has also had first-degree AV block and Mobitz type II AV block . all during the night. Asymptomatic.  Objective:  Vital Signs in the last 24 hours: Temp:  [97.4 F (36.3 C)-98.1 F (36.7 C)] 97.5 F (36.4 C) (01/29 0600) Pulse Rate:  [74-83] 80  (01/29 0600) Resp:  [16-18] 18  (01/29 0600) BP: (102-119)/(48-63) 107/60 mmHg (01/29 0600) SpO2:  [93 %-95 %] 93 % (01/29 0600)  Intake/Output from previous day: 01/28 0701 - 01/29 0700 In: 220 [I.V.:220] Out: -   Physical Exam:  General appearance: alert, cooperative, appears stated age and moderately obese  Neck: no adenopathy, no carotid bruit, no JVD, supple, symmetrical, trachea midline and thyroid not enlarged, symmetric, no tenderness/mass/nodules  Lungs: clear to auscultation bilaterally  Heart: regular rate and rhythm, S1, S2 normal, no murmur, click, rub or gallop  Abdomen: soft, non-tender; bowel sounds normal; no masses, no organomegaly  Extremities: no edema, redness or tenderness in the calves or thighs and right knee post op dressing noted.  Neurologic: Grossly normal   Lab Results:  Basename 06/28/12 0640 06/27/12 0555  WBC 19.5* 14.3*  HGB 10.3* 13.3  PLT 192 220    Basename 06/28/12 0640 06/27/12 0555  NA 135 134*  K 4.5 4.0  CL 102 98  CO2 24 22  GLUCOSE 223* 258*  BUN 40* 24*  CREATININE 1.48* 1.37*    Basename 06/27/12 0039 06/26/12 1152  TROPONINI <0.30 <0.30    Cardiac Studies: Echo 06/17/11: Normal LVEF, mild LVH. normal diastolic function. No signficant valvular abnormality.  Exercise sestamibi 02/25/12: Normal perfusion images. No ischemia. Normal LVEF.  EKG: normal EKG, normal sinus rhythm, unchanged from previous tracings.   Assessment/Plan:   1. Atrial tachycardia versus  brief atrial flutter without any recurrence since being on Cardizem along with Coreg, however has had Mobitz 1 and Mobitz 2 AV block during the night and also short bursts of wide complex tachycardia either nonsustained VT but more probably atrial tachycardia with aberrancy. 2. Chronic renal insufficiency, serum creatinine baseline is between 1.39, to 1.42 as baseline. 3. Diabetes mellitus type 2 presently uncontrolled probably due to postop changes 4. Hypertension 5. Hyperlipidemia  Recommendation: I have reduced the dose of Coreg from 12.5-6.25 mg by mouth twice a day. The events of heart block occurred during the night and he may have mild sleep apnea. During the daytime he is asymptomatic without any heart block or tachycardia. Am not sure that he will need long-term anticoagulation with regard to atrial tachycardia as he has not had any recurrence.  The episodes also were very short lasting less than 5 minutes. I have discontinued Celebrex due to chronic renal insufficiency. I will obtain BMP in the morning and watch his rhythm tonight and if stable can be discharged home from cardiac standpoint. I do not see any contraindication for PT/OT to be continued while here.    Pamella Pert, M.D. 06/28/2012, 8:32 AM [facility] Pager: [phone] Office: [phone] If no answer: [phone]

## 2012-06-28 NOTE — Progress Notes (Signed)
Physical Therapy Treatment Patient Details Name: Howard Charles MRN: [id number] DOB: 1935-09-23 Today's Date: 06/28/2012 Time: 0852-0920 PT Time Calculation (min): 28 min  PT Assessment / Plan / Recommendation Comments on Treatment Session  Pt continues to progress with increased activity and no adverse reactions. Good return demo with one rail and cane for stairs to bedroom. Pt comfotable with one step to enter home. Will continue current POC to increase functional independence.    Follow Up Recommendations  Home health PT;Supervision/Assistance - 24 hour     Does the patient have the potential to tolerate intense rehabilitation     Barriers to Discharge        Equipment Recommendations  Rolling walker with 5" wheels    Recommendations for Other Services    Frequency 7X/week   Plan Discharge plan remains appropriate;Frequency remains appropriate    Precautions / Restrictions Precautions Precautions: Knee Required Braces or Orthoses: Knee Immobilizer - Left Knee Immobilizer - Left: Discontinue once straight leg raise with < 10 degree lag Restrictions Weight Bearing Restrictions: Yes LLE Weight Bearing: Weight bearing as tolerated   Pertinent Vitals/Pain no apparent distress     Mobility  Bed Mobility Sit to Supine: 6: Modified independent (Device/Increase time);HOB flat Transfers Sit to Stand: 6: Modified independent (Device/Increase time);With upper extremity assist;From bed Stand to Sit: To bed;6: Modified independent (Device/Increase time);With upper extremity assist Ambulation/Gait Ambulation/Gait Assistance: 5: Supervision Ambulation Distance (Feet): 200 Feet Assistive device: Rolling walker Ambulation/Gait Assistance Details: Able to progress to step through with min cueing Gait Pattern: Step-through pattern Stairs: Yes Stairs Assistance: 4: Min guard Stairs Assistance Details (indicate cue type and reason): stair training with left rail and cane in right  hand. cueing for safe technique.  Stair Management Technique: One rail Left;With cane Number of Stairs: 2  (x2)    Exercises Total Joint Exercises Quad Sets: AROM;15 reps Short Arc Quad: AAROM;Left;15 reps Heel Slides: AAROM;Left;15 reps (A to increase range) Straight Leg Raises: AAROM;10 reps;Left   PT Diagnosis:    PT Problem List:   PT Treatment Interventions:     PT Goals Acute Rehab PT Goals PT Goal: Sit to Supine/Side - Progress: Progressing toward goal PT Goal: Sit to Stand - Progress: Progressing toward goal PT Goal: Ambulate - Progress: Met PT Goal: Up/Down Stairs - Progress: Met PT Goal: Perform Home Exercise Program - Progress: Progressing toward goal  Visit Information  Last PT Received On: 06/28/12 Assistance Needed: +1    Subjective Data      Cognition  Overall Cognitive Status: Appears within functional limits for tasks assessed/performed Arousal/Alertness: Awake/alert Orientation Level: Appears intact for tasks assessed Behavior During Session: [facility] for tasks performed    Balance     End of Session PT - End of Session Equipment Utilized During Treatment: Gait belt;Left knee immobilizer Activity Tolerance: Patient tolerated treatment well Patient left: in chair;with call bell/phone within reach CPM Left Knee CPM Left Knee: On Left Knee Flexion (Degrees): 70  Left Knee Extension (Degrees): 0    GP     [name] 06/28/2012, 9:34 AM

## 2012-06-28 NOTE — Progress Notes (Signed)
Physical Therapy Treatment Patient Details Name: Howard Charles MRN: [id number] DOB: 08/14/1935 Today's Date: 06/28/2012 Time: 4403-4742 PT Time Calculation (min): 15 min  PT Assessment / Plan / Recommendation Comments on Treatment Session  Progressed gait training without immobiler this session. No noted buckling. Reviewed stair training with good return demo.     Follow Up Recommendations  Home health PT;Supervision/Assistance - 24 hour     Does the patient have the potential to tolerate intense rehabilitation     Barriers to Discharge        Equipment Recommendations  Rolling walker with 5" wheels    Recommendations for Other Services    Frequency 7X/week   Plan Discharge plan remains appropriate;Frequency remains appropriate    Precautions / Restrictions Precautions Precautions: Knee Required Braces or Orthoses: Knee Immobilizer - Left Knee Immobilizer - Left: Discontinue once straight leg raise with < 10 degree lag Restrictions Weight Bearing Restrictions: Yes LLE Weight Bearing: Weight bearing as tolerated   no apparent distress     Mobility  Bed Mobility Bed Mobility: Supine to Sit;Sitting - Scoot to Edge of Bed;Sit to Supine Supine to Sit: 5: Supervision;With rails;HOB elevated Sitting - Scoot to Edge of Bed: 5: Supervision Sit to Supine: 5: Supervision Transfers Sit to Stand: 5: Supervision;From bed;With upper extremity assist Stand to Sit: 5: Supervision;To bed Ambulation/Gait Ambulation/Gait Assistance: 5: Supervision Ambulation Distance (Feet): 400 Feet Assistive device: Rolling walker Ambulation/Gait Assistance Details: 200 ft with L knee immobilizer; 200 ft without immobilizer Gait Pattern: Step-through pattern;Decreased hip/knee flexion - left Stairs Assistance: 4: Min guard Stair Management Technique: One rail Left;With cane Number of Stairs: 2     Exercises     PT Diagnosis:    PT Problem List:   PT Treatment Interventions:     PT  Goals Acute Rehab PT Goals PT Goal: Supine/Side to Sit - Progress: Progressing toward goal PT Goal: Sit to Supine/Side - Progress: Progressing toward goal PT Goal: Sit to Stand - Progress: Met PT Goal: Ambulate - Progress: Met PT Goal: Up/Down Stairs - Progress: Met  Visit Information  Last PT Received On: 06/28/12 Assistance Needed: +1    Subjective Data      Cognition  Overall Cognitive Status: Appears within functional limits for tasks assessed/performed Arousal/Alertness: Awake/alert Orientation Level: Appears intact for tasks assessed Behavior During Session: [facility] for tasks performed    Balance  Balance Balance Assessed: No  End of Session     GP     [name] 06/28/2012, 2:59 PM

## 2012-06-29 ENCOUNTER — Inpatient Hospital Stay (HOSPITAL_COMMUNITY): Payer: Medicare Other

## 2012-06-29 LAB — GLUCOSE, CAPILLARY
Glucose-Capillary: 183 mg/dL — ABNORMAL HIGH (ref 70–99)
Glucose-Capillary: 190 mg/dL — ABNORMAL HIGH (ref 70–99)

## 2012-06-29 LAB — BASIC METABOLIC PANEL
BUN: 42 mg/dL — ABNORMAL HIGH (ref 6–23)
Chloride: 104 mEq/L (ref 96–112)
Creatinine, Ser: 1.4 mg/dL — ABNORMAL HIGH (ref 0.50–1.35)
GFR calc Af Amer: 56 mL/min — ABNORMAL LOW (ref >90)
GFR calc non Af Amer: 48 mL/min — ABNORMAL LOW (ref >90)
Potassium: 4.4 mEq/L (ref 3.5–5.1)

## 2012-06-29 LAB — CBC
HCT: 29 % — ABNORMAL LOW (ref 39.0–52.0)
MCHC: 34.8 g/dL (ref 30.0–36.0)
Platelets: 199 10*3/uL (ref 150–400)
RDW: 14 % (ref 11.5–15.5)
WBC: 15.6 10*3/uL — ABNORMAL HIGH (ref 4.0–10.5)

## 2012-06-29 MED ORDER — INSULIN ASPART 100 UNIT/ML ~~LOC~~ SOLN: 5.0000 [IU] | Freq: Three times a day (TID) | SUBCUTANEOUS | Status: DC

## 2012-06-29 MED ORDER — RIVAROXABAN 10 MG PO TABS: 10.0000 mg | ORAL_TABLET | Freq: Every day | ORAL | Status: DC

## 2012-06-29 MED ORDER — DILTIAZEM HCL ER COATED BEADS 120 MG PO CP24: 120.0000 mg | ORAL_CAPSULE | Freq: Two times a day (BID) | ORAL | Status: DC

## 2012-06-29 NOTE — Progress Notes (Signed)
Patient got out out bed to go to the bathroom without calling for help.  Patient sat down on the toilet, then got up and stumbled while trying to get to his walker in front of him.  Patient fell back on his behind and bumped head back against wall behind him.  No complaints of pain of any kind.  No complaints of increased pain to his knee.  No complaints of head pain, nor nausea, dizziness, shortness of breath.  No complaints of chest pain.  No injury noted.  Head to toe assessment performed with neuro checks.    I had walked past the patient's room a few minutes beforehand to tend to another patient, and he was in his bed at the time.  I heard a bang and went running down the hall and then into his room where I saw him on the bathroom floor.  Patient knew that was he supposed to call for help to get up.  He was otherwise un-phased, and the importance of calling for help when getting out of bed was once again reinforced as had been multiple times previously in the night.  His vital signs were stable, 97.4-152/72-97-18-96% on room air.  His CBG was checked, it was 183.  He is being telemetry monitored, it showed he experienced a PVC around the time very shortly after the fall.    Altamese Cabal, PA was on call for Dr. Lajean Saver, PA.  He had no new orders and the time and said he would tell Kirstin  Shepperson, PA in the morning.  Spoke with Julien Girt, PA this morning, and she ordered a head CT scan due to him stating he bumped his head.  Spoke with patient's wife shortly after fall, and she was concerned about him being too confident in his abilities and asked that he possibly go to SNF as I relayed to [employer], PA to be further explored.

## 2012-06-29 NOTE — Progress Notes (Signed)
Patient discharged in stable condition via wheelchair. Discharge instructions and prescriptions were given and explained 

## 2012-06-29 NOTE — Progress Notes (Signed)
Seen and agreed  06/29/2012 Abb Gobert Elizabeth PTA [phone] pager [phone] office    

## 2012-06-29 NOTE — Discharge Summary (Signed)
Patient ID: Howard Charles MRN: [id number] DOB/AGE: 1935-10-24 77 y.o.  Admit date: 06/26/2012 Discharge date: 06/29/2012  Admission Diagnoses:  Principal Problem:  *Left knee DJD Active Problems:  Alzheimer's disease  COPD  Cardiac arrhythmia  has episodes of atrial flutter and runs of VTach  Diabetes mellitus type 2 in obese  Diabetic neuropathy  Hypertension  GERD (gastroesophageal reflux disease)  Gout  Bilateral hearing loss  Chronic renal insufficiency, stage III (moderate)   Discharge Diagnoses:  Same  Past Medical History  Diagnosis Date  . Diabetes mellitus type 2 in obese   . Diabetic neuropathy   . Hypertension   . GERD (gastroesophageal reflux disease)   . Gout   . Left knee DJD   . Right knee DJD   . PONV (postoperative nausea and vomiting)     H/O ....BUT LAST FEW TIMES---NONE  . Bilateral hearing loss     WEARS HEARING AIDS........RIGHT IS BETTER THEN LEFT  . Chronic renal insufficiency, stage III (moderate)     Creatinine 1.47    Surgeries: Procedure(s): TOTAL KNEE ARTHROPLASTY on 06/26/2012   Consultants:  cardiology  Discharged Condition: Improved  Hospital Course: TAYLEN OSORTO is an 77 y.o. male who was admitted 06/26/2012 for operative treatment ofLeft knee DJD. Patient has severe unremitting pain that affects sleep, daily activities, and work/hobbies. After pre-op clearance the patient was taken to the operating room on 06/26/2012 and underwent  Procedure(s): TOTAL KNEE ARTHROPLASTY.    Patient was given perioperative antibiotics: Anti-infectives     Start     Dose/Rate Route Frequency Ordered Stop   06/26/12 1600   ceFAZolin (ANCEF) IVPB 2 g/50 mL premix        2 g 100 mL/hr over 30 Minutes Intravenous Every 6 hours 06/26/12 1355 06/26/12 2350   06/26/12 1027   cefUROXime (ZINACEF) injection  Status:  Discontinued          As needed 06/26/12 1028 06/26/12 1127   06/26/12 0600   ceFAZolin (ANCEF) IVPB 2 g/50 mL premix        2  g 100 mL/hr over 30 Minutes Intravenous On call to O.R. 06/25/12 1348 06/26/12 0935          Intraop, this patient had runs of aflutter.  Dr Yates Decamp, cardiologist was consulted.  He had episodes of aflutter post op day one and was started on Cardizem.  Post op Day 2 he had 2 runs of VTach.  Since post op day two he has been stable.  This am he stumbled getting up from the toilet and fell.  He bumped his head.  I ordered a STAT head CT.  Once the results of this are back.  I am planning on discharge to home.  The patient is alert oriented and neurologically intact.  Patient was given sequential compression devices, early ambulation, and chemoprophylaxis to prevent DVT.    Patient benefited maximally from hospital stay and there were no other complications.    Recent vital signs: Patient Vitals for the past 24 hrs:  BP Temp Pulse Resp SpO2  06/29/12 0733 134/65 mmHg 97.4 F (36.3 C) 79  18  98 %  06/29/12 0615 128/70 mmHg 97.9 F (36.6 C) 66  18  98 %  06/29/12 0607 108/60 mmHg 97.8 F (36.6 C) 63  16  95 %  06/29/12 0530 129/57 mmHg 97.7 F (36.5 C) 76  18  94 %  06/29/12 0500 152/72 mmHg 97.4 F (36.3 C) 97  18  96 %  06/29/12 0100 126/66 mmHg 97.7 F (36.5 C) 62  18  94 %  2012-07-02 2326 120/95 mmHg - - - -  July 02, 2012 2043 112/55 mmHg 98.4 F (36.9 C) 72  16  94 %  07-02-2012 1256 96/61 mmHg 97.8 F (36.6 C) 84  16  93 %     Recent laboratory studies:  Basename 06/29/12 0645 2012-07-02 0640  WBC 15.6* 19.5*  HGB 10.1* 10.3*  HCT 29.0* 29.5*  PLT 199 192  NA 138 135  K 4.4 4.5  CL 104 102  CO2 24 24  BUN 42* 40*  CREATININE 1.40* 1.48*  GLUCOSE 203* 223*  INR -- --  CALCIUM 9.0 --     Discharge Medications:     Medication List     As of 06/29/2012  8:16 AM    STOP taking these medications         aspirin EC 81 MG tablet      Fish Oil 1000 MG Caps      GLUCOSAMINE CHONDR 1500 COMPLX PO      ibuprofen 200 MG tablet   Commonly known as: ADVIL,MOTRIN       TAKE these medications         acetaminophen 325 MG tablet   Commonly known as: TYLENOL   Take 650 mg by mouth every 6 (six) hours as needed. For pain/fever      allopurinol 300 MG tablet   Commonly known as: ZYLOPRIM   Take 300 mg by mouth daily.      carvedilol 6.25 MG tablet   Commonly known as: COREG   Take 6.25 mg by mouth 2 (two) times daily with a meal.      CENTRUM SILVER ULTRA MENS PO   Take 1 tablet by mouth daily.      diltiazem 120 MG 24 hr capsule   Commonly known as: CARDIZEM CD   Take 1 capsule (120 mg total) by mouth 2 (two) times daily.      insulin glargine 100 UNIT/ML injection   Commonly known as: LANTUS   Inject 41 Units into the skin daily before breakfast.      ONGLYZA 5 MG Tabs tablet   Generic drug: saxagliptin HCl   Take 5 mg by mouth daily.      pantoprazole 40 MG tablet   Commonly known as: PROTONIX   Take 40 mg by mouth daily.      rivaroxaban 10 MG Tabs tablet   Commonly known as: XARELTO   Take 1 tablet (10 mg total) by mouth daily with breakfast.      rOPINIRole 1 MG tablet   Commonly known as: REQUIP   Take 1 mg by mouth at bedtime.      rosuvastatin 10 MG tablet   Commonly known as: CRESTOR   Take 10 mg by mouth daily.      telmisartan-hydrochlorothiazide 80-25 MG per tablet   Commonly known as: MICARDIS HCT   Take 1 tablet by mouth daily.        Diagnostic Studies: Dg Chest 2 View  06/20/2012  *RADIOLOGY REPORT*  Clinical Data: Preop knee surgery  CHEST - 2 VIEW  Comparison: 09/24/2010  Findings: Bibasilar airspace disease has developed since the prior study.  This may represent atelectasis or pneumonia.  No heart failure or effusion.  IMPRESSION: Bibasilar atelectasis/infiltrate.   Original Report Authenticated By: Janeece Riggers, M.D.     Disposition: 01-Home or Self Care  Discharge Orders    Future Orders Please Complete By Expires   Diet - low sodium heart healthy      Call MD / Call 911      Comments:   If you  experience chest pain or shortness of breath, CALL 911 and be transported to the hospital emergency room.  If you develope a fever above 101 F, pus (white drainage) or increased drainage or redness at the wound, or calf pain, call your surgeon's office.   Discharge instructions      Comments:   Total Knee Replacement Care After Refer to this sheet in the next few weeks. These discharge instructions provide you with general information on caring for yourself after you leave the hospital. Your caregiver may also give you specific instructions. Your treatment has been planned according to the most current medical practices available, but unavoidable complications sometimes occur. If you have any problems or questions after discharge, please call your caregiver. Regaining a near full range of motion of your knee within the first 3 to 6 weeks after surgery is critical. HOME CARE INSTRUCTIONS  You may resume a normal diet and activities as directed.  Perform exercises as directed.  Place yellow foam block, yellow side up under heel at all times except when in CPM or when walking.  DO NOT modify, tear, cut, or change in any way. You will receive physical therapy daily  Take showers instead of baths until informed otherwise.  Change bandages (dressings)daily Do not take over-the-counter or prescription medicines for pain, discomfort, or fever. Eat a well-balanced diet.  Avoid lifting or driving until you are instructed otherwise.  Make an appointment to see your caregiver for stitches (suture) or staple removal as directed.  If you have been sent home with a continuous passive motion machine (CPM machine), 0-90 degrees 6 hrs a day   2 hrs a shift SEEK MEDICAL CARE IF: You have swelling of your calf or leg.  You develop shortness of breath or chest pain.  You have redness, swelling, or increasing pain in the wound.  There is pus or any unusual drainage coming from the surgical site.  You notice a bad  smell coming from the surgical site or dressing.  The surgical site breaks open after sutures or staples have been removed.  There is persistent bleeding from the suture or staple line.  You are getting worse or are not improving.  You have any other questions or concerns.  SEEK IMMEDIATE MEDICAL CARE IF:  You have a fever.  You develop a rash.  You have difficulty breathing.  You develop any reaction or side effects to medicines given.  Your knee motion is decreasing rather than improving.  MAKE SURE YOU:  Understand these instructions.  Will watch your condition.  Will get help right away if you are not doing well or get worse.   Constipation Prevention      Comments:   Drink plenty of fluids.  Prune juice may be helpful.  You may use a stool softener, such as Colace (over the counter) 100 mg twice a day.  Use MiraLax (over the counter) for constipation as needed.   Increase activity slowly as tolerated      CPM      Comments:   Continuous passive motion machine (CPM):      Use the CPM from 0 to 90 for 6 hours per day.       You may break it up into 2  or 3 sessions per day.      Use CPM for 2 weeks or until you are told to stop.   TED hose      Comments:   Use stockings (TED hose) for 2 weeks on both leg(s).  You may remove them at night for sleeping.   Change dressing      Comments:   Change the dressing daily with sterile 4 x 4 inch gauze dressing and apply TED hose.  You may clean the incision with alcohol prior to redressing.   Do not put a pillow under the knee. Place it under the heel.      Comments:   Place yellow foam block, yellow side up under heel at all times except when in CPM or when walking.  DO NOT modify, tear, cut, or change in any way the yellow foam block.      Follow-up Information    Follow up with Nilda Simmer, MD. On 07/11/2012. (appt time 8:45)    Contact information:   [address] [place] [address] [phone]        Follow up with Pamella Pert, MD. On 07/04/2012. (appt at 1 :45 pm)    Contact information:   [address] [place] [address] [phone]           Signed: Pascal Lux 06/29/2012, 8:16 AM

## 2012-06-29 NOTE — Progress Notes (Signed)
Subjective:  Patient is presently doing well and remains asymptomatic. Patient has had 6 beat wide-complex tachycardia which is consistent with either A. flutter with aberrancy versus nonsustained mental tachycardia 2 days ago. Fell this mornign by loosing balance. No syncope or dizziness. No chest pain or dyspnea. Feels well o0therwise. He has also had first-degree AV block and Mobitz type II AV block . all during the night. Asymptomatic.  Objective:  Vital Signs in the last 24 hours: Temp:  [97.4 F (36.3 C)-98.4 F (36.9 C)] 97.4 F (36.3 C) (01/30 0733) Pulse Rate:  [62-97] 79  (01/30 0733) Resp:  [16-18] 18  (01/30 0733) BP: (96-152)/(55-95) 134/65 mmHg (01/30 0733) SpO2:  [93 %-98 %] 98 % (01/30 0733)  Intake/Output from previous day:    Physical Exam:  General appearance: alert, cooperative, appears stated age and moderately obese  Neck: no adenopathy, no carotid bruit, no JVD, supple, symmetrical, trachea midline and thyroid not enlarged, symmetric, no tenderness/mass/nodules  Lungs: clear to auscultation bilaterally  Heart: regular rate and rhythm, S1, S2 normal, no murmur, click, rub or gallop  Abdomen: soft, non-tender; bowel sounds normal; no masses, no organomegaly  Extremities: no edema, redness or tenderness in the calves or thighs and right knee post op dressing noted.  Neurologic: Grossly normal   Lab Results:  Basename 06/29/12 0645 06/28/12 0640  WBC 15.6* 19.5*  HGB 10.1* 10.3*  PLT 199 192    Basename 06/29/12 0645 06/28/12 0640  NA 138 135  K 4.4 4.5  CL 104 102  CO2 24 24  GLUCOSE 203* 223*  BUN 42* 40*  CREATININE 1.40* 1.48*    Basename 06/27/12 0039 06/26/12 1152  TROPONINI <0.30 <0.30    Cardiac Studies: Echo 06/17/11: Normal LVEF, mild LVH. normal diastolic function. No signficant valvular abnormality.  Exercise sestamibi 02/25/12: Normal perfusion images. No ischemia. Normal LVEF.  EKG: normal EKG, normal sinus rhythm, unchanged from  previous tracings.   Assessment/Plan:   1. Atrial tachycardia versus brief atrial flutter without any recurrence since being on Cardizem along with Coreg, however has had Mobitz 1 and Mobitz 2 AV block during the night and also short bursts of wide complex tachycardia either nonsustained VT but more probably atrial tachycardia with aberrancy. I had reduced the dose of Coreg yesterday, and I was called about probable high heart rate and atrial arrhythmias last night. 2. Chronic renal insufficiency, serum creatinine baseline is between 1.39, to 1.42 as baseline. 3. Diabetes mellitus type 2 presently uncontrolled probably due to postop changes 4. Hypertension 5. Hyperlipidemia  Recommendation: I have reviewed the alarms on tele. Artifact and no further A. Flutter or heart block. This morning during fall, one PVC and artifact. No complex arrhythmias. OK to discharge on present meds and I will perform event monitor as op and patient instructed to come to my office for hook up. Discussed with ortho.   Pamella Pert, M.D. 06/29/2012, 9:09 AM [facility] Pager: [phone] Office: [phone] If no answer: [phone]

## 2012-06-29 NOTE — Progress Notes (Signed)
Patient ID: COLBIN JOVEL, male   DOB: 11/21/1935, 77 y.o.   MRN: [id number] Patient fell at 5 am getting up from the bathroom unassisted.  He states it was not a hard fall but he did bump his head.  Since he is on Xarelto and had a head injury will get a CT scan.  He is alert and oriented times 3 and neurologically intact.  Archie Atilano A. Gwinda Passe Physician Assistant [facility] [phone]  06/29/2012, 8:06 AM

## 2012-06-29 NOTE — Progress Notes (Signed)
Seen and agreed  06/29/2012 Robinette, Julia Elizabeth PTA [phone] pager [phone] office    

## 2012-06-29 NOTE — Progress Notes (Signed)
Physical Therapy Treatment Patient Details Name: Howard Charles MRN: [id number] DOB: [birth year] Today's Date: 06/29/2012 Time: 0802-0829 PT Time Calculation (min): 27 min  PT Assessment / Plan / Recommendation Comments on Treatment Session  Pt reported fall this morning due to loss of balance but feeling well. Completed exercises in supine, long hall ambulation and flight of stairs with cane without immoblizer. Pt able to maintain balance throughout. Pt instructed to call nursing if he needs to get up for any reason due to recent fall.    Follow Up Recommendations  Home health PT     Does the patient have the potential to tolerate intense rehabilitation     Barriers to Discharge        Equipment Recommendations  Rolling walker with 5" wheels    Recommendations for Other Services    Frequency 7X/week   Plan Discharge plan remains appropriate;Frequency remains appropriate    Precautions / Restrictions Precautions Precautions: Knee Required Braces or Orthoses: Knee Immobilizer - Left Knee Immobilizer - Left: Discontinue once straight leg raise with < 10 degree lag Restrictions LLE Weight Bearing: Weight bearing as tolerated       Mobility  Bed Mobility Supine to Sit: 5: Supervision;HOB flat Sitting - Scoot to Edge of Bed: 5: Supervision Transfers Sit to Stand: 5: Supervision;With upper extremity assist;From bed Stand to Sit: 5: Supervision;To bed;With upper extremity assist Ambulation/Gait Ambulation Distance (Feet): 200 Feet Assistive device: Rolling walker Ambulation/Gait Assistance Details: without immobilizer Gait Pattern: Step-through pattern Stairs Assistance: 4: Min guard Stairs Assistance Details (indicate cue type and reason): Cueing for safety and use of cane Stair Management Technique: One rail Left;With cane Number of Stairs: 14     Exercises Total Joint Exercises Quad Sets: AROM;10 reps;Both Short Arc Quad: AROM;Left;10 reps Heel Slides: AROM;Left;10  reps Straight Leg Raises: AAROM;10 reps;Left (pt able to complete last 4 no Assistance)   PT Diagnosis:    PT Problem List:   PT Treatment Interventions:     PT Goals Acute Rehab PT Goals PT Goal: Supine/Side to Sit - Progress: Progressing toward goal PT Goal: Sit to Stand - Progress: Met PT Goal: Ambulate - Progress: Met PT Goal: Up/Down Stairs - Progress: Met PT Goal: Perform Home Exercise Program - Progress: Progressing toward goal  Visit Information  Last PT Received On: 06/29/12 Assistance Needed: +1    Subjective Data      Cognition  Overall Cognitive Status: Appears within functional limits for tasks assessed/performed Arousal/Alertness: Awake/alert Orientation Level: Appears intact for tasks assessed Behavior During Session: [facility] for tasks performed    Balance     End of Session PT - End of Session Equipment Utilized During Treatment: Gait belt Activity Tolerance: Patient tolerated treatment well Patient left: in chair;with call bell/phone within reach   GP     [name] 06/29/2012, 8:43 AM

## 2013-04-24 ENCOUNTER — Encounter (HOSPITAL_BASED_OUTPATIENT_CLINIC_OR_DEPARTMENT_OTHER): Payer: Medicare Other

## 2013-05-09 ENCOUNTER — Other Ambulatory Visit: Payer: Self-pay | Admitting: Physician Assistant

## 2013-05-09 ENCOUNTER — Encounter: Payer: Self-pay | Admitting: Physician Assistant

## 2013-05-09 NOTE — H&P (Signed)
TOTAL KNEE ADMISSION H&P  Patient is being admitted for right total knee arthroplasty.  Subjective:  Chief Complaint:right knee pain.  HPI: Howard Charles, 77 y.o. male, has a history of pain and functional disability in the right knee due to arthritis and has failed non-surgical conservative treatments for greater than 12 weeks to includeNSAID's and/or analgesics, corticosteriod injections, viscosupplementation injections, flexibility and strengthening excercises, supervised PT with diminished ADL's post treatment, use of assistive devices and activity modification.  Onset of symptoms was gradual, starting 10 years ago with gradually worsening course since that time. The patient noted prior procedures on the knee to include  arthroscopy and menisectomy on the right knee(s).  Patient currently rates pain in the right knee(s) at 10 out of 10 with activity. Patient has night pain, worsening of pain with activity and weight bearing, pain that interferes with activities of daily living, crepitus and joint swelling.  Patient has evidence of subchondral sclerosis, periarticular osteophytes and joint space narrowing by imaging studies.  There is no active infection.  Patient Active Problem List   Diagnosis Date Noted  . Cardiac arrhythmia  has episodes of atrial flutter and runs of VTach 06/26/2012  . Left knee DJD 06/26/2012  . Diabetes mellitus type 2 in obese   . Diabetic neuropathy   . Hypertension   . GERD (gastroesophageal reflux disease)   . Gout   . Right knee DJD   . Bilateral hearing loss   . Chronic renal insufficiency, stage III (moderate)   . Alzheimer's disease 01/15/2007  . COPD 01/15/2007   Past Medical History  Diagnosis Date  . Diabetes mellitus type 2 in obese   . Diabetic neuropathy   . Hypertension   . GERD (gastroesophageal reflux disease)   . Gout   . Left knee DJD   . Right knee DJD   . PONV (postoperative nausea and vomiting)     H/O ....BUT LAST FEW TIMES---NONE   . Bilateral hearing loss     WEARS HEARING AIDS........RIGHT IS BETTER THEN LEFT  . Chronic renal insufficiency, stage III (moderate)     Creatinine 1.47    Past Surgical History  Procedure Laterality Date  . Knee arthroscopy  2008    left   . Knee arthroscopy  2012    left  . Knee arthroscopy  2002    right  . Hemorrhoid surgery    . Inner ear surgery    . Shoulder arthroscopy with rotator cuff repair  2007  . Suprapubic prostatectomy      21 YRS  AGO  . Cholecystectomy    . Tonsillectomy    . Total knee arthroplasty  06/26/2012    Procedure: TOTAL KNEE ARTHROPLASTY;  Surgeon: Robert A [facility], MD;  Location: [facility] OR;  Service: Orthopedics;  Laterality: Left;     (Not in a hospital admission) No Known Allergies   Current Outpatient Prescriptions on File Prior to Visit  Medication Sig Dispense Refill  . allopurinol (ZYLOPRIM) 300 MG tablet Take 300 mg by mouth daily.      . carvedilol (COREG) 6.25 MG tablet Take 6.25 mg by mouth 2 (two) times daily with a meal.      . Multiple Vitamins-Minerals (CENTRUM SILVER ULTRA MENS PO) Take 1 tablet by mouth daily.      . pantoprazole (PROTONIX) 40 MG tablet Take 40 mg by mouth daily.      . rOPINIRole (REQUIP) 1 MG tablet Take 1 mg by mouth at bedtime.      .   rosuvastatin (CRESTOR) 10 MG tablet Take 10 mg by mouth daily.      . saxagliptin HCl (ONGLYZA) 5 MG TABS tablet Take 5 mg by mouth daily.      . telmisartan-hydrochlorothiazide (MICARDIS HCT) 80-25 MG per tablet Take 1 tablet by mouth daily.      . acetaminophen (TYLENOL) 325 MG tablet Take 650 mg by mouth every 6 (six) hours as needed. For pain/fever      . insulin glargine (LANTUS) 100 UNIT/ML injection Inject 52 Units into the skin daily before breakfast.        No current facility-administered medications on file prior to visit.   History  Substance Use Topics  . Smoking status: Never Smoker   . Smokeless tobacco: Never Used  . Alcohol Use: No    Family History  Problem  Relation Age of Onset  . Hypertension Mother   . Diabetes Mother   . Heart disease Mother   . Heart disease Father   . Heart attack Father   . Hypertension Father   . Diabetes Mellitus II Father      Review of Systems  Constitutional: Negative.   HENT: Negative.   Eyes: Negative.   Respiratory: Negative.   Cardiovascular: Negative.   Gastrointestinal: Negative.   Genitourinary: Negative.   Musculoskeletal: Positive for back pain and joint pain.  Skin: Negative.   Neurological: Negative.   Endo/Heme/Allergies: Negative.   Psychiatric/Behavioral: Negative.     Objective:  Physical Exam  Constitutional: He is oriented to person, place, and time. He appears well-developed and well-nourished.  HENT:  Head: Normocephalic and atraumatic.  Eyes: Conjunctivae are normal. Pupils are equal, round, and reactive to light.  Neck: Normal range of motion. Neck supple.  Cardiovascular: Normal rate, regular rhythm and normal heart sounds.   Respiratory: Effort normal.  GI: Soft.  Genitourinary:  Not pertinent to current symptomatology therefore not examined.  Musculoskeletal: He exhibits edema and tenderness.  Examination of his left knee reveals well healed incision minimal swelling minimal pain, range of motion 0-115 degrees knee is stable with normal patella tracking. Exam of the right knee reveals 1+ crepitation 1+ synovitis mild varus deformity range of motion -5 to 110 degrees knee is stable with normal patella tracking and diffuse pain. Vascular exam: pulses 2+ and symmetric.  Neurological: He is alert and oriented to person, place, and time.  Skin: Skin is warm and dry.  Psychiatric: He has a normal mood and affect. His behavior is normal.    Vital signs in last 24 hours: Last recorded: 12/10 1500   BP: 153/82 Pulse: 63  Height: 5' 10" (1.778 m) SpO2: 96  Weight: 99.791 kg (220 lb)     Labs:   Estimated body mass index is 29.58 kg/(m^2) as calculated from the  following:   Height as of 06/26/12: 5' 10" (1.778 m).   Weight as of 06/20/12: 93.5 kg (206 lb 2.1 oz).   Imaging Review Plain radiographs demonstrate severe degenerative joint disease of the right knee(s). The overall alignment issignificant varus. The bone quality appears to be good for age and reported activity level.  Assessment/Plan:  End stage arthritis, right knee   The patient history, physical examination, clinical judgment of the provider and imaging studies are consistent with end stage degenerative joint disease of the right knee(s) and total knee arthroplasty is deemed medically necessary. The treatment options including medical management, injection therapy arthroscopy and arthroplasty were discussed at length. The risks and benefits of total knee   arthroplasty were presented and reviewed. The risks due to aseptic loosening, infection, stiffness, patella tracking problems, thromboembolic complications and other imponderables were discussed. The patient acknowledged the explanation, agreed to proceed with the plan and consent was signed. Patient is being admitted for inpatient treatment for surgery, pain control, PT, OT, prophylactic antibiotics, VTE prophylaxis, progressive ambulation and ADL's and discharge planning. The patient is planning to be discharged home with home health services  Alivia Cimino A. Jarius Dieudonne, PA-C Physician Assistant [facility] [phone]  05/09/2013, 3:09 PM   

## 2013-05-10 ENCOUNTER — Encounter (HOSPITAL_COMMUNITY): Payer: Self-pay | Admitting: Pharmacy Technician

## 2013-05-11 NOTE — Pre-Procedure Instructions (Signed)
DALLON DACOSTA  05/11/2013   Your procedure is scheduled on:  Monday, December 22nd.  Report to [facility], Main Entrance/Entrance "A" at 8:45 AM.  Call this number if you have problems the morning of surgery: [phone]   Remember:   Do not eat food or drink liquids after midnight, Sunday.   Take these medicines the morning of surgery with A SIP OF WATER: Allopurinol, Carvedilol, Pantoprazole.  Tylenol if needed. Stop Xarelto on 05/18/13. Stop Aspirin , Vitamins, and Fish oil, 5 days prior to  Surgery.  Do not wear jewelry.  Do not wear lotions, powders, or perfumes. You may wear deodorant.   Men may shave face and neck.  Do not bring valuables to the hospital.  [facility] is not responsible  for any belongings or valuables.               Contacts, dentures or bridgework may not be worn into surgery.  Leave suitcase in the car. After surgery it may be brought to your room.  For patients admitted to the hospital, discharge time is determined by your treatment team.                 Special Instructions: Shower using CHG 2 nights before surgery and the night before surgery.  If you shower the day of surgery use CHG.  Use special wash - you have one bottle of CHG for all showers.  You should use approximately 1/3 of the bottle for each shower.   Please read over the following fact sheets that you were given: Pain Booklet, Coughing and Deep Breathing and Surgical Site Infection Prevention

## 2013-05-14 ENCOUNTER — Encounter (HOSPITAL_COMMUNITY): Payer: Self-pay

## 2013-05-14 ENCOUNTER — Encounter (HOSPITAL_COMMUNITY)
Admission: RE | Admit: 2013-05-14 | Discharge: 2013-05-14 | Disposition: A | Discharge status: Home / Self Care | Payer: Medicare Other | Source: Ambulatory Visit | Attending: Orthopedic Surgery | Admitting: Orthopedic Surgery

## 2013-05-14 ENCOUNTER — Encounter (HOSPITAL_COMMUNITY)
Admission: RE | Admit: 2013-05-14 | Discharge: 2013-05-14 | Disposition: A | Discharge status: Home / Self Care | Payer: Medicare Other | Source: Ambulatory Visit | Attending: Physician Assistant | Admitting: Physician Assistant

## 2013-05-14 DIAGNOSIS — Z01812 Encounter for preprocedural laboratory examination: Secondary | ICD-10-CM | POA: Insufficient documentation

## 2013-05-14 DIAGNOSIS — Z01818 Encounter for other preprocedural examination: Secondary | ICD-10-CM | POA: Insufficient documentation

## 2013-05-14 HISTORY — DX: Cardiac arrhythmia, unspecified: I49.9

## 2013-05-14 HISTORY — DX: Dependence on supplemental oxygen: Z99.81

## 2013-05-14 LAB — CBC WITH DIFFERENTIAL/PLATELET
Basophils Relative: 1 % (ref 0–1)
Eosinophils Absolute: 0.9 10*3/uL — ABNORMAL HIGH (ref 0.0–0.7)
HCT: 44.8 % (ref 39.0–52.0)
MCH: 32.3 pg (ref 26.0–34.0)
MCHC: 34.8 g/dL (ref 30.0–36.0)
MCV: 92.8 fL (ref 78.0–100.0)
Monocytes Absolute: 0.6 10*3/uL (ref 0.1–1.0)
Neutrophils Relative %: 56 % (ref 43–77)
Platelets: 182 10*3/uL (ref 150–400)
RDW: 14.2 % (ref 11.5–15.5)

## 2013-05-14 LAB — URINE CULTURE: Colony Count: NO GROWTH

## 2013-05-14 LAB — COMPREHENSIVE METABOLIC PANEL
ALT: 33 U/L (ref 0–53)
Albumin: 3.5 g/dL (ref 3.5–5.2)
Alkaline Phosphatase: 105 U/L (ref 39–117)
Calcium: 9.4 mg/dL (ref 8.4–10.5)
Potassium: 4.1 mEq/L (ref 3.5–5.1)
Sodium: 140 mEq/L (ref 135–145)
Total Protein: 6.8 g/dL (ref 6.0–8.3)

## 2013-05-14 LAB — URINALYSIS, ROUTINE W REFLEX MICROSCOPIC
Bilirubin Urine: NEGATIVE
Nitrite: NEGATIVE
Protein, ur: NEGATIVE mg/dL
Specific Gravity, Urine: 1.02 (ref 1.005–1.030)
Urobilinogen, UA: 0.2 mg/dL (ref 0.0–1.0)

## 2013-05-14 LAB — TYPE AND SCREEN
ABO/RH(D): O POS
Antibody Screen: NEGATIVE

## 2013-05-14 LAB — SURGICAL PCR SCREEN
MRSA, PCR: NEGATIVE
Staphylococcus aureus: NEGATIVE

## 2013-05-14 LAB — PROTIME-INR: Prothrombin Time: 15.7 seconds — ABNORMAL HIGH (ref 11.6–15.2)

## 2013-05-15 ENCOUNTER — Encounter (HOSPITAL_COMMUNITY): Payer: Self-pay

## 2013-05-15 NOTE — Progress Notes (Signed)
Anesthesia Chart Review:  Patient is a 77 year old male scheduled for right TKR on 05/21/13 by Dr. Thurston Hole.  History includes non-smoker, post-operative N/V, DM2, diabetic neuropathy, paroxysmal afib/flutter (post-TKR PAF and Mobitz I/II at night, asymptomatic 05/2012), CKD stage III, bilateral hearing loss, GERD, HTN, prostatectomy, left TKR 05/2012. PCP is Dr. Renne Crigler and cardiologist is Dr. Jacinto Halim.  Both have cleared patient for this procedure.  EKG on 05/02/13 (Dr. Renne Crigler) showed SB @ 59 bpm.  According to Dr. Verl Dicker 02/2013 office note: Echo on 03/05/13 showed: LV internal dimension is decreased.  Moderate concentric hypertrophy. Mild septal notching. Diastolic filling with pseudonormal pattern and elevated mean LA pressure. Normal global wall motion. Normal systolic global function, calculated EF 60%. Doppler evidence of grade II (pseudonormal) diastolic dysfunction. LA cavity is mildly dilated.  Mild aneurysmal motion of the interatrial septum. Trace TR. No pulmonary hypertension.  Exercise Myoview stress test on 02/26/13 showed: Stress EKG negative for ischemia.  Perfusion imaging demonstrates very mild diaphragmatic attenuation artifact in the inferior wall, a small sized inferior wall ischemia cannot completely excluded although this does not reach statistical significant by polar plot images.  Dynamic gated images reveal normal wll motion and endocardial thickening.  LV EF was estimated to be 59%.  This represents a low risk study.  Nocturnal oxymetry 02/26/13: PO2 < 88%, 20 minutes, < 88% 58 minutes, lowest SPO2 83%. Total desaturation events 172, average events per hour 21.  Patient qualifies for oxygen supplementation.  CXR on 05/14/13 showed no active cardiopulmonary disease.  Preoperative labs noted.  Cr 1.49, appears stable.  Glucose is 214.  He reports a fasting glucose usually ~ 110-120. He will get a fasting CBG on arrival.    I called patient to confirm what he had been instructed  regarding his anti-platelet therapy.  He reports he was told to hold ASA 5 days preoperatively and was instructed by Dr. Verl Dicker office that he could hold Xarelto after his 05/18/13 dose in anticipation of surgery.   If not acute changes then I would anticipate that he could proceed as planned.   Velna Ochs [facility] Short Stay Center/Anesthesiology Phone [phone] 05/15/2013 3:14 PM

## 2013-05-20 MED ORDER — TRANEXAMIC ACID 100 MG/ML IV SOLN
1000.0000 mg | INTRAVENOUS | Status: AC
  Administered 2013-05-21: 1000 mg via INTRAVENOUS
  Filled 2013-05-20: qty 10

## 2013-05-20 MED ORDER — CEFAZOLIN SODIUM-DEXTROSE 2-3 GM-% IV SOLR
2.0000 g | INTRAVENOUS | Status: AC
  Administered 2013-05-21: 2 g via INTRAVENOUS

## 2013-05-21 ENCOUNTER — Encounter (HOSPITAL_COMMUNITY): Payer: Medicare Other | Admitting: Vascular Surgery

## 2013-05-21 ENCOUNTER — Inpatient Hospital Stay (HOSPITAL_COMMUNITY): Payer: Medicare Other | Admitting: Anesthesiology

## 2013-05-21 ENCOUNTER — Encounter (HOSPITAL_COMMUNITY): Payer: Self-pay | Admitting: *Deleted

## 2013-05-21 ENCOUNTER — Encounter (HOSPITAL_COMMUNITY)
Admission: RE | Disposition: A | Discharge status: Home Health | Payer: Self-pay | Source: Ambulatory Visit | Attending: Orthopedic Surgery

## 2013-05-21 ENCOUNTER — Inpatient Hospital Stay (HOSPITAL_COMMUNITY)
Admission: RE | Admit: 2013-05-21 | Discharge: 2013-05-22 | DRG: 470 | Disposition: A | Discharge status: Home Health | Payer: Medicare Other | Source: Ambulatory Visit | Attending: Orthopedic Surgery | Admitting: Orthopedic Surgery

## 2013-05-21 DIAGNOSIS — Z7982 Long term (current) use of aspirin: Secondary | ICD-10-CM

## 2013-05-21 DIAGNOSIS — M1710 Unilateral primary osteoarthritis, unspecified knee: Principal | ICD-10-CM | POA: Diagnosis present

## 2013-05-21 DIAGNOSIS — E1142 Type 2 diabetes mellitus with diabetic polyneuropathy: Secondary | ICD-10-CM | POA: Diagnosis present

## 2013-05-21 DIAGNOSIS — M1711 Unilateral primary osteoarthritis, right knee: Secondary | ICD-10-CM | POA: Diagnosis present

## 2013-05-21 DIAGNOSIS — E1149 Type 2 diabetes mellitus with other diabetic neurological complication: Secondary | ICD-10-CM | POA: Diagnosis present

## 2013-05-21 DIAGNOSIS — I129 Hypertensive chronic kidney disease with stage 1 through stage 4 chronic kidney disease, or unspecified chronic kidney disease: Secondary | ICD-10-CM | POA: Diagnosis present

## 2013-05-21 DIAGNOSIS — Z96659 Presence of unspecified artificial knee joint: Secondary | ICD-10-CM

## 2013-05-21 DIAGNOSIS — M109 Gout, unspecified: Secondary | ICD-10-CM | POA: Diagnosis present

## 2013-05-21 DIAGNOSIS — I4729 Other ventricular tachycardia: Secondary | ICD-10-CM | POA: Diagnosis present

## 2013-05-21 DIAGNOSIS — J449 Chronic obstructive pulmonary disease, unspecified: Secondary | ICD-10-CM | POA: Diagnosis present

## 2013-05-21 DIAGNOSIS — M1712 Unilateral primary osteoarthritis, left knee: Secondary | ICD-10-CM | POA: Diagnosis present

## 2013-05-21 DIAGNOSIS — E1140 Type 2 diabetes mellitus with diabetic neuropathy, unspecified: Secondary | ICD-10-CM | POA: Diagnosis present

## 2013-05-21 DIAGNOSIS — H9193 Unspecified hearing loss, bilateral: Secondary | ICD-10-CM | POA: Diagnosis present

## 2013-05-21 DIAGNOSIS — K219 Gastro-esophageal reflux disease without esophagitis: Secondary | ICD-10-CM | POA: Diagnosis present

## 2013-05-21 DIAGNOSIS — Z79899 Other long term (current) drug therapy: Secondary | ICD-10-CM

## 2013-05-21 DIAGNOSIS — I499 Cardiac arrhythmia, unspecified: Secondary | ICD-10-CM | POA: Diagnosis present

## 2013-05-21 DIAGNOSIS — I4892 Unspecified atrial flutter: Secondary | ICD-10-CM | POA: Diagnosis present

## 2013-05-21 DIAGNOSIS — H9190 Unspecified hearing loss, unspecified ear: Secondary | ICD-10-CM | POA: Diagnosis present

## 2013-05-21 DIAGNOSIS — I472 Ventricular tachycardia, unspecified: Secondary | ICD-10-CM | POA: Diagnosis present

## 2013-05-21 DIAGNOSIS — I4891 Unspecified atrial fibrillation: Secondary | ICD-10-CM | POA: Diagnosis present

## 2013-05-21 DIAGNOSIS — J4489 Other specified chronic obstructive pulmonary disease: Secondary | ICD-10-CM | POA: Diagnosis present

## 2013-05-21 DIAGNOSIS — I10 Essential (primary) hypertension: Secondary | ICD-10-CM | POA: Diagnosis present

## 2013-05-21 DIAGNOSIS — E669 Obesity, unspecified: Secondary | ICD-10-CM | POA: Diagnosis present

## 2013-05-21 DIAGNOSIS — E1169 Type 2 diabetes mellitus with other specified complication: Secondary | ICD-10-CM | POA: Diagnosis present

## 2013-05-21 DIAGNOSIS — N183 Chronic kidney disease, stage 3 unspecified: Secondary | ICD-10-CM | POA: Diagnosis present

## 2013-05-21 HISTORY — PX: TOTAL KNEE ARTHROPLASTY: SHX125

## 2013-05-21 LAB — GLUCOSE, CAPILLARY
Glucose-Capillary: 109 mg/dL — ABNORMAL HIGH (ref 70–99)
Glucose-Capillary: 82 mg/dL (ref 70–99)
Glucose-Capillary: 109 mg/dL — ABNORMAL HIGH (ref 70–99)
Glucose-Capillary: 98 mg/dL (ref 70–99)
Glucose-Capillary: 112 mg/dL — ABNORMAL HIGH (ref 70–99)

## 2013-05-21 SURGERY — ARTHROPLASTY, KNEE, TOTAL
Anesthesia: General | Site: Knee | Laterality: Right

## 2013-05-21 MED ORDER — CHLORHEXIDINE GLUCONATE 4 % EX LIQD: 60.0000 mL | Freq: Once | CUTANEOUS | Status: DC

## 2013-05-21 MED ORDER — INSULIN ASPART 100 UNIT/ML ~~LOC~~ SOLN: 0.0000 [IU] | Freq: Every day | SUBCUTANEOUS | Status: DC

## 2013-05-21 MED ORDER — TELMISARTAN-HCTZ 80-25 MG PO TABS: 1.0000 | ORAL_TABLET | Freq: Every day | ORAL | Status: DC

## 2013-05-21 MED ORDER — OXYCODONE HCL 5 MG/5ML PO SOLN: 5.0000 mg | Freq: Once | ORAL | Status: AC | PRN

## 2013-05-21 MED ORDER — BUPIVACAINE-EPINEPHRINE (PF) 0.25% -1:200000 IJ SOLN
INTRAMUSCULAR | Status: AC
  Filled 2013-05-21: qty 30

## 2013-05-21 MED ORDER — HYDROMORPHONE HCL PF 1 MG/ML IJ SOLN
INTRAMUSCULAR | Status: AC
  Filled 2013-05-21: qty 1

## 2013-05-21 MED ORDER — INSULIN ASPART 100 UNIT/ML ~~LOC~~ SOLN
4.0000 [IU] | Freq: Three times a day (TID) | SUBCUTANEOUS | Status: DC
  Administered 2013-05-21 – 2013-05-22 (×3): 4 [IU] via SUBCUTANEOUS

## 2013-05-21 MED ORDER — ASPIRIN EC 81 MG PO TBEC
81.0000 mg | DELAYED_RELEASE_TABLET | Freq: Every day | ORAL | Status: DC
  Administered 2013-05-22: 81 mg via ORAL
  Filled 2013-05-21: qty 1

## 2013-05-21 MED ORDER — MEPERIDINE HCL 25 MG/ML IJ SOLN: 6.2500 mg | INTRAMUSCULAR | Status: DC | PRN

## 2013-05-21 MED ORDER — PANTOPRAZOLE SODIUM 40 MG PO TBEC
40.0000 mg | DELAYED_RELEASE_TABLET | Freq: Every day | ORAL | Status: DC
  Administered 2013-05-21 – 2013-05-22 (×2): 40 mg via ORAL
  Filled 2013-05-21 (×2): qty 1

## 2013-05-21 MED ORDER — OXYCODONE HCL 5 MG PO TABS
5.0000 mg | ORAL_TABLET | Freq: Once | ORAL | Status: AC | PRN
  Administered 2013-05-21: 5 mg via ORAL

## 2013-05-21 MED ORDER — PHENOL 1.4 % MT LIQD: 1.0000 | OROMUCOSAL | Status: DC | PRN

## 2013-05-21 MED ORDER — METOCLOPRAMIDE HCL 5 MG/ML IJ SOLN: 5.0000 mg | Freq: Three times a day (TID) | INTRAMUSCULAR | Status: DC | PRN

## 2013-05-21 MED ORDER — LACTATED RINGERS IV SOLN
INTRAVENOUS | Status: DC | PRN
  Administered 2013-05-21 (×2): via INTRAVENOUS

## 2013-05-21 MED ORDER — ONDANSETRON HCL 4 MG/2ML IJ SOLN: 4.0000 mg | Freq: Four times a day (QID) | INTRAMUSCULAR | Status: DC | PRN

## 2013-05-21 MED ORDER — LIDOCAINE HCL (CARDIAC) 20 MG/ML IV SOLN
INTRAVENOUS | Status: DC | PRN
  Administered 2013-05-21: 100 mg via INTRAVENOUS

## 2013-05-21 MED ORDER — ALLOPURINOL 300 MG PO TABS
300.0000 mg | ORAL_TABLET | Freq: Every day | ORAL | Status: DC
Start: 2013-05-21 — End: 2013-05-22
  Administered 2013-05-21 – 2013-05-22 (×2): 300 mg via ORAL
  Filled 2013-05-21 (×2): qty 1

## 2013-05-21 MED ORDER — OXYCODONE HCL 5 MG PO TABS
ORAL_TABLET | ORAL | Status: AC
  Filled 2013-05-21: qty 1

## 2013-05-21 MED ORDER — SODIUM CHLORIDE 0.9 % IV SOLN
INTRAVENOUS | Status: DC
  Administered 2013-05-21: 17:00:00 via INTRAVENOUS

## 2013-05-21 MED ORDER — DIPHENHYDRAMINE HCL 12.5 MG/5ML PO ELIX: 12.5000 mg | ORAL_SOLUTION | ORAL | Status: DC | PRN

## 2013-05-21 MED ORDER — PROPOFOL 10 MG/ML IV BOLUS
INTRAVENOUS | Status: DC | PRN
  Administered 2013-05-21: 150 mg via INTRAVENOUS

## 2013-05-21 MED ORDER — DEXAMETHASONE SODIUM PHOSPHATE 10 MG/ML IJ SOLN
10.0000 mg | Freq: Three times a day (TID) | INTRAMUSCULAR | Status: AC
  Filled 2013-05-21 (×3): qty 1

## 2013-05-21 MED ORDER — DEXAMETHASONE SODIUM PHOSPHATE 4 MG/ML IJ SOLN
INTRAMUSCULAR | Status: DC | PRN
  Administered 2013-05-21: 10 mg via INTRAVENOUS

## 2013-05-21 MED ORDER — HYDROMORPHONE HCL PF 1 MG/ML IJ SOLN: 0.2500 mg | INTRAMUSCULAR | Status: DC | PRN

## 2013-05-21 MED ORDER — SODIUM CHLORIDE 0.9 % IR SOLN
Status: DC | PRN
  Administered 2013-05-21: 1000 mL
  Administered 2013-05-21: 3000 mL

## 2013-05-21 MED ORDER — ACETAMINOPHEN 650 MG RE SUPP: 650.0000 mg | Freq: Four times a day (QID) | RECTAL | Status: DC | PRN

## 2013-05-21 MED ORDER — CEFAZOLIN SODIUM-DEXTROSE 2-3 GM-% IV SOLR
INTRAVENOUS | Status: AC
  Filled 2013-05-21: qty 50

## 2013-05-21 MED ORDER — DEXAMETHASONE 6 MG PO TABS
10.0000 mg | ORAL_TABLET | Freq: Three times a day (TID) | ORAL | Status: AC
  Administered 2013-05-21 – 2013-05-22 (×3): 10 mg via ORAL
  Filled 2013-05-21 (×3): qty 1

## 2013-05-21 MED ORDER — OXYCODONE HCL 5 MG/5ML PO SOLN: 5.0000 mg | Freq: Once | ORAL | Status: DC | PRN

## 2013-05-21 MED ORDER — OXYCODONE HCL 5 MG PO TABS: 5.0000 mg | ORAL_TABLET | Freq: Once | ORAL | Status: DC | PRN

## 2013-05-21 MED ORDER — HYDROCHLOROTHIAZIDE 25 MG PO TABS
25.0000 mg | ORAL_TABLET | Freq: Every day | ORAL | Status: DC
  Administered 2013-05-21: 25 mg via ORAL
  Filled 2013-05-21 (×2): qty 1

## 2013-05-21 MED ORDER — METOCLOPRAMIDE HCL 10 MG PO TABS: 5.0000 mg | ORAL_TABLET | Freq: Three times a day (TID) | ORAL | Status: DC | PRN

## 2013-05-21 MED ORDER — IRBESARTAN 300 MG PO TABS
300.0000 mg | ORAL_TABLET | Freq: Every day | ORAL | Status: DC
  Administered 2013-05-21: 300 mg via ORAL
  Filled 2013-05-21 (×2): qty 1

## 2013-05-21 MED ORDER — DEXTROSE 50 % IV SOLN
INTRAVENOUS | Status: AC
  Filled 2013-05-21: qty 50

## 2013-05-21 MED ORDER — HYDROMORPHONE HCL PF 1 MG/ML IJ SOLN
0.2500 mg | INTRAMUSCULAR | Status: DC | PRN
  Administered 2013-05-21 (×4): 0.5 mg via INTRAVENOUS

## 2013-05-21 MED ORDER — ONDANSETRON HCL 4 MG/2ML IJ SOLN: 4.0000 mg | Freq: Once | INTRAMUSCULAR | Status: DC | PRN

## 2013-05-21 MED ORDER — MENTHOL 3 MG MT LOZG: 1.0000 | LOZENGE | OROMUCOSAL | Status: DC | PRN

## 2013-05-21 MED ORDER — CARVEDILOL 6.25 MG PO TABS
6.2500 mg | ORAL_TABLET | Freq: Two times a day (BID) | ORAL | Status: DC
  Administered 2013-05-21 – 2013-05-22 (×2): 6.25 mg via ORAL
  Filled 2013-05-21 (×4): qty 1

## 2013-05-21 MED ORDER — MIDAZOLAM HCL 2 MG/2ML IJ SOLN
INTRAMUSCULAR | Status: AC
  Administered 2013-05-21: 2 mg via INTRAVENOUS
  Filled 2013-05-21: qty 2

## 2013-05-21 MED ORDER — EPHEDRINE SULFATE 50 MG/ML IJ SOLN
INTRAMUSCULAR | Status: DC | PRN
  Administered 2013-05-21 (×3): 5 mg via INTRAVENOUS
  Administered 2013-05-21: 10 mg via INTRAVENOUS
  Administered 2013-05-21: 5 mg via INTRAVENOUS

## 2013-05-21 MED ORDER — BUPIVACAINE-EPINEPHRINE PF 0.5-1:200000 % IJ SOLN
INTRAMUSCULAR | Status: DC | PRN
  Administered 2013-05-21: 30 mL via PERINEURAL

## 2013-05-21 MED ORDER — ACETAMINOPHEN 325 MG PO TABS: 650.0000 mg | ORAL_TABLET | Freq: Four times a day (QID) | ORAL | Status: DC | PRN

## 2013-05-21 MED ORDER — HYDROMORPHONE HCL PF 1 MG/ML IJ SOLN: 0.5000 mg | INTRAMUSCULAR | Status: DC | PRN

## 2013-05-21 MED ORDER — BUPIVACAINE-EPINEPHRINE 0.25% -1:200000 IJ SOLN
INTRAMUSCULAR | Status: DC | PRN
  Administered 2013-05-21: 30 mL

## 2013-05-21 MED ORDER — POVIDONE-IODINE 7.5 % EX SOLN
Freq: Once | CUTANEOUS | Status: DC
  Filled 2013-05-21: qty 118

## 2013-05-21 MED ORDER — ONDANSETRON HCL 4 MG PO TABS: 4.0000 mg | ORAL_TABLET | Freq: Four times a day (QID) | ORAL | Status: DC | PRN

## 2013-05-21 MED ORDER — LACTATED RINGERS IV SOLN
INTRAVENOUS | Status: DC
  Administered 2013-05-21: 09:00:00 via INTRAVENOUS

## 2013-05-21 MED ORDER — RIVAROXABAN 20 MG PO TABS
20.0000 mg | ORAL_TABLET | Freq: Every day | ORAL | Status: DC
  Filled 2013-05-21 (×2): qty 1

## 2013-05-21 MED ORDER — FENTANYL CITRATE 0.05 MG/ML IJ SOLN
INTRAMUSCULAR | Status: AC
  Administered 2013-05-21: 50 ug via INTRAVENOUS
  Filled 2013-05-21: qty 2

## 2013-05-21 MED ORDER — INSULIN ASPART 100 UNIT/ML ~~LOC~~ SOLN
0.0000 [IU] | Freq: Three times a day (TID) | SUBCUTANEOUS | Status: DC
  Administered 2013-05-22: 3 [IU] via SUBCUTANEOUS
  Administered 2013-05-22: 8 [IU] via SUBCUTANEOUS

## 2013-05-21 MED ORDER — CEFAZOLIN SODIUM 1-5 GM-% IV SOLN
1.0000 g | Freq: Four times a day (QID) | INTRAVENOUS | Status: AC
  Administered 2013-05-21 (×2): 1 g via INTRAVENOUS
  Filled 2013-05-21 (×2): qty 50

## 2013-05-21 MED ORDER — ATORVASTATIN CALCIUM 20 MG PO TABS
20.0000 mg | ORAL_TABLET | Freq: Every day | ORAL | Status: DC
  Administered 2013-05-21: 20 mg via ORAL
  Filled 2013-05-21 (×2): qty 1

## 2013-05-21 MED ORDER — OXYCODONE HCL 5 MG PO TABS
5.0000 mg | ORAL_TABLET | ORAL | Status: DC | PRN
  Administered 2013-05-22 (×2): 10 mg via ORAL
  Filled 2013-05-21 (×2): qty 2

## 2013-05-21 MED ORDER — FENTANYL CITRATE 0.05 MG/ML IJ SOLN
INTRAMUSCULAR | Status: DC | PRN
  Administered 2013-05-21 (×2): 50 ug via INTRAVENOUS
  Administered 2013-05-21: 100 ug via INTRAVENOUS
  Administered 2013-05-21: 50 ug via INTRAVENOUS

## 2013-05-21 SURGICAL SUPPLY — 67 items
BANDAGE ELASTIC 6 VELCRO ST LF (GAUZE/BANDAGES/DRESSINGS) ×1 IMPLANT
BANDAGE ESMARK 6X9 LF (GAUZE/BANDAGES/DRESSINGS) ×1 IMPLANT
BLADE SAGITTAL 25.0X1.19X90 (BLADE) ×2 IMPLANT
BLADE SAW SGTL 11.0X1.19X90.0M (BLADE) IMPLANT
BLADE SAW SGTL 13.0X1.19X90.0M (BLADE) ×2 IMPLANT
BLADE SURG 10 STRL SS (BLADE) ×4 IMPLANT
BNDG CMPR 9X6 STRL LF SNTH (GAUZE/BANDAGES/DRESSINGS) ×1
BNDG CMPR MED 15X6 ELC VLCR LF (GAUZE/BANDAGES/DRESSINGS) ×1
BNDG ELASTIC 6X15 VLCR STRL LF (GAUZE/BANDAGES/DRESSINGS) ×2 IMPLANT
BNDG ESMARK 6X9 LF (GAUZE/BANDAGES/DRESSINGS) ×2
BOWL SMART MIX CTS (DISPOSABLE) ×2 IMPLANT
CAPT RP KNEE ×1 IMPLANT
CEMENT HV SMART SET (Cement) ×4 IMPLANT
CLOTH BEACON ORANGE TIMEOUT ST (SAFETY) ×2 IMPLANT
COVER SURGICAL LIGHT HANDLE (MISCELLANEOUS) ×2 IMPLANT
CUFF TOURNIQUET SINGLE 34IN LL (TOURNIQUET CUFF) ×2 IMPLANT
CUFF TOURNIQUET SINGLE 44IN (TOURNIQUET CUFF) IMPLANT
DRAPE EXTREMITY T 121X128X90 (DRAPE) ×2 IMPLANT
DRAPE INCISE IOBAN 66X45 STRL (DRAPES) ×2 IMPLANT
DRAPE PROXIMA HALF (DRAPES) ×2 IMPLANT
DRAPE U-SHAPE 47X51 STRL (DRAPES) ×2 IMPLANT
DRSG ADAPTIC 3X8 NADH LF (GAUZE/BANDAGES/DRESSINGS) ×2 IMPLANT
DRSG PAD ABDOMINAL 8X10 ST (GAUZE/BANDAGES/DRESSINGS) ×4 IMPLANT
DURAPREP 26ML APPLICATOR (WOUND CARE) ×2 IMPLANT
ELECT CAUTERY BLADE 6.4 (BLADE) ×2 IMPLANT
ELECT REM PT RETURN 9FT ADLT (ELECTROSURGICAL) ×2
ELECTRODE REM PT RTRN 9FT ADLT (ELECTROSURGICAL) ×1 IMPLANT
EVACUATOR 1/8 PVC DRAIN (DRAIN) ×2 IMPLANT
FACESHIELD LNG OPTICON STERILE (SAFETY) ×2 IMPLANT
GLOVE BIO SURGEON STRL SZ7 (GLOVE) ×2 IMPLANT
GLOVE BIOGEL PI IND STRL 7.0 (GLOVE) ×1 IMPLANT
GLOVE BIOGEL PI IND STRL 7.5 (GLOVE) ×1 IMPLANT
GLOVE BIOGEL PI INDICATOR 7.0 (GLOVE) ×1
GLOVE BIOGEL PI INDICATOR 7.5 (GLOVE) ×1
GLOVE SS BIOGEL STRL SZ 7.5 (GLOVE) ×1 IMPLANT
GLOVE SUPERSENSE BIOGEL SZ 7.5 (GLOVE) ×1
GOWN PREVENTION PLUS XLARGE (GOWN DISPOSABLE) ×4 IMPLANT
GOWN STRL NON-REIN LRG LVL3 (GOWN DISPOSABLE) ×4 IMPLANT
HANDPIECE INTERPULSE COAX TIP (DISPOSABLE) ×2
HOOD PEEL AWAY FACE SHEILD DIS (HOOD) ×4 IMPLANT
IMMOBILIZER KNEE 22 UNIV (SOFTGOODS) ×1 IMPLANT
KIT BASIN OR (CUSTOM PROCEDURE TRAY) ×2 IMPLANT
KIT ROOM TURNOVER OR (KITS) ×2 IMPLANT
MANIFOLD NEPTUNE II (INSTRUMENTS) ×2 IMPLANT
NS IRRIG 1000ML POUR BTL (IV SOLUTION) ×2 IMPLANT
PACK TOTAL JOINT (CUSTOM PROCEDURE TRAY) ×2 IMPLANT
PAD ABD 8X10 STRL (GAUZE/BANDAGES/DRESSINGS) ×1 IMPLANT
PAD ARMBOARD 7.5X6 YLW CONV (MISCELLANEOUS) ×4 IMPLANT
PAD CAST 4YDX4 CTTN HI CHSV (CAST SUPPLIES) ×1 IMPLANT
PADDING CAST COTTON 4X4 STRL (CAST SUPPLIES) ×2
PADDING CAST COTTON 6X4 STRL (CAST SUPPLIES) ×2 IMPLANT
RUBBERBAND STERILE (MISCELLANEOUS) ×2 IMPLANT
SET HNDPC FAN SPRY TIP SCT (DISPOSABLE) ×1 IMPLANT
SPONGE GAUZE 4X4 12PLY (GAUZE/BANDAGES/DRESSINGS) ×2 IMPLANT
STRIP CLOSURE SKIN 1/2X4 (GAUZE/BANDAGES/DRESSINGS) ×2 IMPLANT
SUCTION FRAZIER TIP 10 FR DISP (SUCTIONS) ×2 IMPLANT
SUT ETHIBOND NAB CT1 #1 30IN (SUTURE) ×4 IMPLANT
SUT MNCRL AB 3-0 PS2 18 (SUTURE) ×2 IMPLANT
SUT VIC AB 0 CT1 27 (SUTURE) ×4
SUT VIC AB 0 CT1 27XBRD ANBCTR (SUTURE) ×2 IMPLANT
SUT VIC AB 2-0 CT1 27 (SUTURE) ×4
SUT VIC AB 2-0 CT1 TAPERPNT 27 (SUTURE) ×2 IMPLANT
SYR 30ML SLIP (SYRINGE) ×2 IMPLANT
TOWEL OR 17X24 6PK STRL BLUE (TOWEL DISPOSABLE) ×2 IMPLANT
TOWEL OR 17X26 10 PK STRL BLUE (TOWEL DISPOSABLE) ×2 IMPLANT
TRAY FOLEY CATH 16FR SILVER (SET/KITS/TRAYS/PACK) ×2 IMPLANT
WATER STERILE IRR 1000ML POUR (IV SOLUTION) ×4 IMPLANT

## 2013-05-21 NOTE — H&P (View-Only) (Signed)
TOTAL KNEE ADMISSION H&P  Patient is being admitted for right total knee arthroplasty.  Subjective:  Chief Complaint:right knee pain.  HPI: Howard Charles, 77 y.o. male, has a history of pain and functional disability in the right knee due to arthritis and has failed non-surgical conservative treatments for greater than 12 weeks to includeNSAID's and/or analgesics, corticosteriod injections, viscosupplementation injections, flexibility and strengthening excercises, supervised PT with diminished ADL's post treatment, use of assistive devices and activity modification.  Onset of symptoms was gradual, starting 10 years ago with gradually worsening course since that time. The patient noted prior procedures on the knee to include  arthroscopy and menisectomy on the right knee(s).  Patient currently rates pain in the right knee(s) at 10 out of 10 with activity. Patient has night pain, worsening of pain with activity and weight bearing, pain that interferes with activities of daily living, crepitus and joint swelling.  Patient has evidence of subchondral sclerosis, periarticular osteophytes and joint space narrowing by imaging studies.  There is no active infection.  Patient Active Problem List   Diagnosis Date Noted  . Cardiac arrhythmia  has episodes of atrial flutter and runs of VTach 06/26/2012  . Left knee DJD 06/26/2012  . Diabetes mellitus type 2 in obese   . Diabetic neuropathy   . Hypertension   . GERD (gastroesophageal reflux disease)   . Gout   . Right knee DJD   . Bilateral hearing loss   . Chronic renal insufficiency, stage III (moderate)   . Alzheimer's disease 01/15/2007  . COPD 01/15/2007   Past Medical History  Diagnosis Date  . Diabetes mellitus type 2 in obese   . Diabetic neuropathy   . Hypertension   . GERD (gastroesophageal reflux disease)   . Gout   . Left knee DJD   . Right knee DJD   . PONV (postoperative nausea and vomiting)     H/O ....BUT LAST FEW TIMES---NONE   . Bilateral hearing loss     WEARS HEARING AIDS........RIGHT IS BETTER THEN LEFT  . Chronic renal insufficiency, stage III (moderate)     Creatinine 1.47    Past Surgical History  Procedure Laterality Date  . Knee arthroscopy  2008    left   . Knee arthroscopy  2012    left  . Knee arthroscopy  2002    right  . Hemorrhoid surgery    . Inner ear surgery    . Shoulder arthroscopy with rotator cuff repair  2007  . Suprapubic prostatectomy      21 YRS  AGO  . Cholecystectomy    . Tonsillectomy    . Total knee arthroplasty  06/26/2012    Procedure: TOTAL KNEE ARTHROPLASTY;  Surgeon: Nilda Simmer, MD;  Location: [facility] OR;  Service: Orthopedics;  Laterality: Left;     (Not in a hospital admission) No Known Allergies   Current Outpatient Prescriptions on File Prior to Visit  Medication Sig Dispense Refill  . allopurinol (ZYLOPRIM) 300 MG tablet Take 300 mg by mouth daily.      . carvedilol (COREG) 6.25 MG tablet Take 6.25 mg by mouth 2 (two) times daily with a meal.      . Multiple Vitamins-Minerals (CENTRUM SILVER ULTRA MENS PO) Take 1 tablet by mouth daily.      . pantoprazole (PROTONIX) 40 MG tablet Take 40 mg by mouth daily.      Marland Kitchen rOPINIRole (REQUIP) 1 MG tablet Take 1 mg by mouth at bedtime.      Marland Kitchen  rosuvastatin (CRESTOR) 10 MG tablet Take 10 mg by mouth daily.      . saxagliptin HCl (ONGLYZA) 5 MG TABS tablet Take 5 mg by mouth daily.      Marland Kitchen telmisartan-hydrochlorothiazide (MICARDIS HCT) 80-25 MG per tablet Take 1 tablet by mouth daily.      Marland Kitchen acetaminophen (TYLENOL) 325 MG tablet Take 650 mg by mouth every 6 (six) hours as needed. For pain/fever      . insulin glargine (LANTUS) 100 UNIT/ML injection Inject 52 Units into the skin daily before breakfast.        No current facility-administered medications on file prior to visit.   History  Substance Use Topics  . Smoking status: Never Smoker   . Smokeless tobacco: Never Used  . Alcohol Use: No    Family History  Problem  Relation Age of Onset  . Hypertension Mother   . Diabetes Mother   . Heart disease Mother   . Heart disease Father   . Heart attack Father   . Hypertension Father   . Diabetes Mellitus II Father      Review of Systems  Constitutional: Negative.   HENT: Negative.   Eyes: Negative.   Respiratory: Negative.   Cardiovascular: Negative.   Gastrointestinal: Negative.   Genitourinary: Negative.   Musculoskeletal: Positive for back pain and joint pain.  Skin: Negative.   Neurological: Negative.   Endo/Heme/Allergies: Negative.   Psychiatric/Behavioral: Negative.     Objective:  Physical Exam  Constitutional: He is oriented to person, place, and time. He appears well-developed and well-nourished.  HENT:  Head: Normocephalic and atraumatic.  Eyes: Conjunctivae are normal. Pupils are equal, round, and reactive to light.  Neck: Normal range of motion. Neck supple.  Cardiovascular: Normal rate, regular rhythm and normal heart sounds.   Respiratory: Effort normal.  GI: Soft.  Genitourinary:  Not pertinent to current symptomatology therefore not examined.  Musculoskeletal: He exhibits edema and tenderness.  Examination of his left knee reveals well healed incision minimal swelling minimal pain, range of motion 0-115 degrees knee is stable with normal patella tracking. Exam of the right knee reveals 1+ crepitation 1+ synovitis mild varus deformity range of motion -5 to 110 degrees knee is stable with normal patella tracking and diffuse pain. Vascular exam: pulses 2+ and symmetric.  Neurological: He is alert and oriented to person, place, and time.  Skin: Skin is warm and dry.  Psychiatric: He has a normal mood and affect. His behavior is normal.    Vital signs in last 24 hours: Last recorded: 12/10 1500   BP: 153/82 Pulse: 63  Height: 5\' 10"  (1.778 m) SpO2: 96  Weight: 99.791 kg (220 lb)     Labs:   Estimated body mass index is 29.58 kg/(m^2) as calculated from the  following:   Height as of 06/26/12: 5\' 10"  (1.778 m).   Weight as of 06/20/12: 93.5 kg (206 lb 2.1 oz).   Imaging Review Plain radiographs demonstrate severe degenerative joint disease of the right knee(s). The overall alignment issignificant varus. The bone quality appears to be good for age and reported activity level.  Assessment/Plan:  End stage arthritis, right knee   The patient history, physical examination, clinical judgment of the provider and imaging studies are consistent with end stage degenerative joint disease of the right knee(s) and total knee arthroplasty is deemed medically necessary. The treatment options including medical management, injection therapy arthroscopy and arthroplasty were discussed at length. The risks and benefits of total knee  arthroplasty were presented and reviewed. The risks due to aseptic loosening, infection, stiffness, patella tracking problems, thromboembolic complications and other imponderables were discussed. The patient acknowledged the explanation, agreed to proceed with the plan and consent was signed. Patient is being admitted for inpatient treatment for surgery, pain control, PT, OT, prophylactic antibiotics, VTE prophylaxis, progressive ambulation and ADL's and discharge planning. The patient is planning to be discharged home with home health services  Kacin Dancy A. Gwinda Passe Physician Assistant [facility] [phone]  05/09/2013, 3:09 PM

## 2013-05-21 NOTE — Progress Notes (Signed)
Orthopedic Tech Progress Note Patient Details:  Howard Charles [birth year] [id number]  CPM Right Knee CPM Right Knee: On Right Knee Flexion (Degrees): 60 Right Knee Extension (Degrees): 0 Additional Comments: Trapeze bar and foot roll   Cammer, Mickie Bail 05/21/2013, 2:37 PM

## 2013-05-21 NOTE — Brief Op Note (Signed)
05/21/2013  1:20 PM  PATIENT:  Howard Charles  77 y.o. male  PRE-OPERATIVE DIAGNOSIS:  DJD RIGHT KNEE  POST-OPERATIVE DIAGNOSIS:  DJD RIGHT KNEE  PROCEDURE:  Procedure(s): TOTAL KNEE ARTHROPLASTY (Right)  SURGEON:  Surgeon(s) and Role:    * Nilda Simmer, MD - Primary  PHYSICIAN ASSISTANT: Margart Sickles, PA-C  ASSISTANTS:    ANESTHESIA:   regional and general  EBL:  Total I/O In: 1000 [I.V.:1000] Out: -   BLOOD ADMINISTERED:none  DRAINS: hemovac drain right knee self suction   LOCAL MEDICATIONS USED:  MARCAINE     SPECIMEN:  No Specimen  DISPOSITION OF SPECIMEN:  N/A  COUNTS:  YES  TOURNIQUET:   Total Tourniquet Time Documented: Thigh (Right) - 106 minutes Total: Thigh (Right) - 106 minutes   DICTATION: .Other Dictation: Dictation Number unknown  PLAN OF CARE: Admit to inpatient   PATIENT DISPOSITION:  PACU - hemodynamically stable.   Delay start of Pharmacological VTE agent (>24hrs) due to surgical blood loss or risk of bleeding: yes

## 2013-05-21 NOTE — Op Note (Signed)
MRN:     [id number] DOB/AGE:    [birth year] / 75 y.o.       OPERATIVE REPORT    DATE OF PROCEDURE:  05/21/2013       PREOPERATIVE DIAGNOSIS:   DJD RIGHT KNEE      Estimated body mass index is 29.58 kg/(m^2) as calculated from the following:   Height as of 05/14/13: 5\' 10"  (1.778 m).   Weight as of 06/20/12: 93.5 kg (206 lb 2.1 oz).                                                        POSTOPERATIVE DIAGNOSIS:   DJD RIGHT KNEE                                                                      PROCEDURE:  Procedure(s): TOTAL KNEE ARTHROPLASTY Using Depuy Sigma RP implants #4 Femur, #4Tibia, 12.6mm sigma RP bearing, 35 Patella     SURGEON: Mikyah Alamo A    ASSISTANT:  Junius Finner PA-C   (Present and scrubbed throughout the case, critical for assistance with exposure, retraction, instrumentation, and closure.)         ANESTHESIA: GET with Femoral Nerve Block  DRAINS: foley, 2 medium hemovac in knee   TOURNIQUET TIME:   COMPLICATIONS:  None     SPECIMENS: None   INDICATIONS FOR PROCEDURE: The patient has  DJD RIGHT KNEE, varus deformities, XR shows bone on bone arthritis. Patient has failed all conservative measures including anti-inflammatory medicines, narcotics, attempts at  exercise and weight loss, cortisone injections and viscosupplementation.  Risks and benefits of surgery have been discussed, questions answered.   DESCRIPTION OF PROCEDURE: The patient identified by armband, received  right femoral nerve block and IV antibiotics, in the holding area at [facility]. Patient taken to the operating room, appropriate anesthetic  monitors were attached General endotracheal anesthesia induced with  the patient in supine position, Foley catheter was inserted. Tourniquet  applied high to the operative thigh. Lateral post and foot positioner  applied to the table, the lower extremity was then prepped and draped  in usual sterile fashion from the ankle to the  tourniquet. Time-out procedure was performed. The limb was wrapped with an Esmarch bandage and the tourniquet inflated to 365 mmHg. We began the operation by making the anterior midline incision starting at handbreadth above the patella going over the patella 1 cm medial to and  4 cm distal to the tibial tubercle. Small bleeders in the skin and the  subcutaneous tissue identified and cauterized. Transverse retinaculum was incised and reflected medially and a medial parapatellar arthrotomy was accomplished. the patella was everted and theprepatellar fat pad resected. The superficial medial collateral  ligament was then elevated from anterior to posterior along the proximal  flare of the tibia and anterior half of the menisci resected. The knee was hyperflexed exposing bone on bone arthritis. Peripheral and notch osteophytes as well as the cruciate ligaments were then resected. We continued to  work our way around posteriorly along the proximal tibia, and externally  rotated the tibia subluxing it out from underneath the femur. A McHale  retractor was placed through the notch and a lateral Hohmann retractor  placed, and we then drilled through the proximal tibia in line with the  axis of the tibia followed by an intramedullary guide rod and 2-degree  posterior slope cutting guide. The tibial cutting guide was pinned into place  allowing resection of 5 mm of bone medially and about 4 mm of bone  laterally because of her valgus deformity. Satisfied with the tibial resection, we then  entered the distal femur 2 mm anterior to the PCL origin with the  intramedullary guide rod and applied the distal femoral cutting guide  set at 11mm, with 5 degrees of valgus. This was pinned along the  epicondylar axis. At this point, the distal femoral cut was accomplished without difficulty. We then sized for a #4 femoral component and pinned the guide in 3 degrees of external rotation.The chamfer cutting guide was  pinned into place. The anterior, posterior, and chamfer cuts were accomplished without difficulty followed by  the Sigma RP box cutting guide and the box cut. We also removed posterior osteophytes from the posterior femoral condyles. At this  time, the knee was brought into full extension. We checked our  extension and flexion gaps and found them symmetric at 12.73mm.  The patella thickness measured at 23 mm. We set the cutting guide at 14 and removed the posterior 9.5-10 mm  of the patella sized for 35 button and drilled the lollipop. The knee  was then once again hyperflexed exposing the proximal tibia. We sized for a #4 tibial base plate, applied the smokestack and the conical reamer followed by the the Delta fin keel punch. We then hammered into place the Sigma RP trial femoral component, inserted a 12.5-mm trial bearing, trial patellar button, and took the knee through range of motion from 0-130 degrees. No thumb pressure was required for patellar  tracking. At this point, all trial components were removed, a double batch of DePuy HV cement with was mixed and applied to all bony metallic mating surfaces except for the posterior condyles of the femur itself. In order, we  hammered into place the tibial tray and removed excess cement, the femoral component and removed excess cement, a 12.5-mm Sigma RP bearing  was inserted, and the knee brought to full extension with compression.  The patellar button was clamped into place, and excess cement  removed. While the cement cured the wound was irrigated out with normal saline solution pulse lavage, and medium Hemovac drains were placed.. Ligament stability and patellar tracking were checked and found to be excellent. The tourniquet was then released and hemostasis was obtained with cautery. The parapatellar arthrotomy was closed with  #1 ethibond suture. The subcutaneous tissue with 0 and 2-0 undyed  Vicryl suture, and 4-0 Monocryl.. A dressing of Xeroform,   4 x 4, dressing sponges, Webril, and Ace wrap applied. Needle and sponge count were correct times 2.The patient awakened, extubated, and taken to recovery room without difficulty. Vascular status was normal, pulses 2+ and symmetric.   Kalla Watson A 05/21/2013, 12:24 PM

## 2013-05-21 NOTE — Interval H&P Note (Signed)
History and Physical Interval Note:  05/21/2013 10:40 AM  Howard Charles  has presented today for surgery, with the diagnosis of DJD RIGHT KNEE  The various methods of treatment have been discussed with the patient and family. After consideration of risks, benefits and other options for treatment, the patient has consented to  Procedure(s): TOTAL KNEE ARTHROPLASTY (Right) as a surgical intervention .  The patient's history has been reviewed, patient examined, no change in status, stable for surgery.  I have reviewed the patient's chart and labs.  Questions were answered to the patient's satisfaction.     Salvatore Marvel A

## 2013-05-21 NOTE — Anesthesia Postprocedure Evaluation (Signed)
Anesthesia Post Note  Patient: Howard Charles  Procedure(s) Performed: Procedure(s) (LRB): TOTAL KNEE ARTHROPLASTY (Right)  Anesthesia type: general  Patient location: PACU  Post pain: Pain level controlled  Post assessment: Patient's Cardiovascular Status Stable  Last Vitals:  Filed Vitals:   05/21/13 1538  BP: 147/86  Pulse: 77  Temp: 36.2 C  Resp: 18    Post vital signs: Reviewed and stable  Level of consciousness: sedated  Complications: No apparent anesthesia complications

## 2013-05-21 NOTE — Anesthesia Procedure Notes (Signed)
Anesthesia Regional Block:  Adductor canal block  Pre-Anesthetic Checklist: ,, timeout performed, Correct Patient, Correct Site, Correct Laterality, Correct Procedure, Correct Position, site marked, Risks and benefits discussed,  Surgical consent,  Pre-op evaluation,  At surgeon's request and post-op pain management  Laterality: Right  Prep: chloraprep       Needles:  Injection technique: Single-shot  Needle Type: Echogenic Stimulator Needle     Needle Length:cm 9 cm Needle Gauge: 21 and 21 G    Additional Needles:  Procedures: ultrasound guided (picture in chart) Adductor canal block Narrative:  Start time: 05/21/2013 9:55 AM End time: 05/21/2013 10:07 AM Injection made incrementally with aspirations every 5 mL.  Performed by: Personally  Anesthesiologist: Arta Bruce MD  Additional Notes: Monitors applied. Patient sedated. Sterile prep and drape,hand hygiene and sterile gloves were used. Relevant anatomy identified.Needle position confirmed.Local anesthetic injected incrementally after negative aspiration. Local anesthetic spread visualized around nerve(s). Vascular puncture avoided. No complications. Image printed for medical record.The patient tolerated the procedure well.    Arta Bruce MD

## 2013-05-21 NOTE — Anesthesia Preprocedure Evaluation (Signed)
Anesthesia Evaluation  Patient identified by MRN, date of birth, ID band Patient awake    Reviewed: Allergy & Precautions, H&P , NPO status , Patient's Chart, lab work & pertinent test results  History of Anesthesia Complications (+) PONV  Airway Mallampati: I TM Distance: >3 FB Neck ROM: Full    Dental   Pulmonary COPD         Cardiovascular hypertension, Pt. on medications + dysrhythmias Atrial Fibrillation     Neuro/Psych    GI/Hepatic GERD-  Medicated and Controlled,  Endo/Other  diabetes, Well Controlled, Type 2, Insulin Dependent and Oral Hypoglycemic Agents  Renal/GU Renal InsufficiencyRenal disease     Musculoskeletal   Abdominal   Peds  Hematology   Anesthesia Other Findings   Reproductive/Obstetrics                           Anesthesia Physical Anesthesia Plan  ASA: III  Anesthesia Plan: General   Post-op Pain Management:    Induction: Intravenous  Airway Management Planned: LMA  Additional Equipment:   Intra-op Plan:   Post-operative Plan: Extubation in OR  Informed Consent: I have reviewed the patients History and Physical, chart, labs and discussed the procedure including the risks, benefits and alternatives for the proposed anesthesia with the patient or authorized representative who has indicated his/her understanding and acceptance.     Plan Discussed with: CRNA and Surgeon  Anesthesia Plan Comments:         Anesthesia Quick Evaluation

## 2013-05-21 NOTE — Transfer of Care (Signed)
Immediate Anesthesia Transfer of Care Note  Patient: Howard Charles  Procedure(s) Performed: Procedure(s): TOTAL KNEE ARTHROPLASTY (Right)  Patient Location: PACU  Anesthesia Type:General  Level of Consciousness: sedated and patient cooperative  Airway & Oxygen Therapy: Patient Spontanous Breathing and Patient connected to face mask oxygen  Post-op Assessment: Report given to PACU RN and Post -op Vital signs reviewed and stable  Post vital signs: Reviewed and stable  Complications: No apparent anesthesia complications

## 2013-05-21 NOTE — Evaluation (Signed)
Physical Therapy Evaluation Patient Details Name: Howard Charles MRN: [id number] DOB: [birth year] Today's Date: 05/21/2013 Time: 4098-1191 PT Time Calculation (min): 34 min  PT Assessment / Plan / Recommendation History of Present Illness  Patient is a 77 yo male s/p Rt TKA.  Patient had Lt TKA in January 2014.  Clinical Impression  Patient presents with problems listed below.  Will benefit from acute PT to maximize independence prior to discharge home with wife.    PT Assessment  Patient needs continued PT services    Follow Up Recommendations  Home health PT;Supervision/Assistance - 24 hour    Does the patient have the potential to tolerate intense rehabilitation      Barriers to Discharge        Equipment Recommendations  Crutches    Recommendations for Other Services     Frequency 7X/week    Precautions / Restrictions Precautions Precautions: Knee Precaution Booklet Issued: Yes (comment) Precaution Comments: Reviewed precautions with patient Required Braces or Orthoses: Knee Immobilizer - Right Knee Immobilizer - Right: On when out of bed or walking Restrictions Weight Bearing Restrictions: Yes RLE Weight Bearing: Weight bearing as tolerated   Pertinent Vitals/Pain       Mobility  Bed Mobility Bed Mobility: Supine to Sit;Sitting - Scoot to Edge of Bed Supine to Sit: 4: Min assist;With rails;HOB elevated Sitting - Scoot to Edge of Bed: 4: Min guard;With rail Details for Bed Mobility Assistance: Educated patient on donning KI on RLE.  Verbal cues for technique for bed mobility.  Assist with bringing RLE off of bed, and raising trunk to sitting position.  Once upright, patient with good balance. Transfers Transfers: Sit to Stand;Stand to [employer] Transfers Sit to Stand: 3: Mod assist;With upper extremity assist;From bed Stand to Sit: 4: Min assist;With upper extremity assist;With armrests;To chair/3-in-1 Stand Pivot Transfers: 3: Mod assist Details for  Transfer Assistance: Verbal cues for hand placement and safe use of RW.  Assist to rise to standing and for balance.  Slight dizziness with standing - resolved with time.  Patient able to take several steps to pivot to chair.  Patient became nauseated/vomited in standing.  Once resolved, had patient complete transfer to chair.  RN notified.  Patient remained slightly nauseated, with no further vomiting.  Assisted patient (set-up and clean-up) for brushing teeth.   Ambulation/Gait Ambulation/Gait Assistance: Not tested (comment)    Exercises Total Joint Exercises Ankle Circles/Pumps: AROM;Both;10 reps;Supine   PT Diagnosis: Difficulty walking;Acute pain  PT Problem List: Decreased strength;Decreased range of motion;Decreased activity tolerance;Decreased balance;Decreased mobility;Decreased knowledge of use of DME;Decreased knowledge of precautions;Pain PT Treatment Interventions: DME instruction;Gait training;Stair training;Functional mobility training;Therapeutic exercise;Patient/family education     PT Goals(Current goals can be found in the care plan section) Acute Rehab PT Goals Patient Stated Goal: To get to go home soon PT Goal Formulation: With patient Time For Goal Achievement: 05/28/13 Potential to Achieve Goals: Good  Visit Information  Last PT Received On: 05/21/13 Assistance Needed: +1 History of Present Illness: Patient is a 77 yo male s/p Rt TKA.  Patient had Lt TKA in January 2014.       Prior Functioning  Home Living Family/patient expects to be discharged to:: Private residence Living Arrangements: Spouse/significant other Available Help at Discharge: Family;Available 24 hours/day Type of Home: House Home Access: Stairs to enter [employer] of Steps: 1 Entrance Stairs-Rails: None Home Layout: Two level;Able to live on main level with bedroom/bathroom;1/2 bath on main level Home Equipment: Walker - 2  wheels;Cane - single point;Bedside commode;Shower  seat;Hand held shower head Additional Comments: Patient reports he used crutches following Lt TKA.  His crutches are in poor repair - needs new crutches. Prior Function Level of Independence: Independent Communication Communication: HOH Dominant Hand: Right    Cognition  Cognition Arousal/Alertness: Awake/alert Behavior During Therapy: WFL for tasks assessed/performed Overall Cognitive Status: Within Functional Limits for tasks assessed    Extremity/Trunk Assessment Upper Extremity Assessment Upper Extremity Assessment: Overall WFL for tasks assessed Lower Extremity Assessment Lower Extremity Assessment: RLE deficits/detail RLE Deficits / Details: Decreased strength and ROM due to surgery/pain.  Patient is able to lift RLE off of bed - at least 3/5 strength. RLE: Unable to fully assess due to pain RLE Sensation: history of peripheral neuropathy LLE Sensation: history of peripheral neuropathy Cervical / Trunk Assessment Cervical / Trunk Assessment: Normal   Balance Balance Balance Assessed: Yes Static Sitting Balance Static Sitting - Balance Support: No upper extremity supported;Feet supported Static Sitting - Level of Assistance: 5: Stand by assistance Static Sitting - Comment/# of Minutes: 5 Static Standing Balance Static Standing - Balance Support: Bilateral upper extremity supported Static Standing - Level of Assistance: 4: Min assist Static Standing - Comment/# of Minutes: 2  End of Session PT - End of Session Equipment Utilized During Treatment: Gait belt;Right knee immobilizer;Oxygen Activity Tolerance: Patient limited by fatigue (Limited by nausea) Patient left: in chair;with call bell/phone within reach Nurse Communication: Mobility status (Nausea) CPM Right Knee CPM Right Knee: Off (off at 18:30)  GP     Vena Austria 05/21/2013, 7:17 PM Durenda Hurt. Renaldo Fiddler, [facility] Acute Rehab Services Pager [phone]

## 2013-05-22 ENCOUNTER — Encounter (HOSPITAL_COMMUNITY): Payer: Self-pay | Admitting: Orthopedic Surgery

## 2013-05-22 LAB — BASIC METABOLIC PANEL
BUN: 30 mg/dL — ABNORMAL HIGH (ref 6–23)
CO2: 22 mEq/L (ref 19–32)
Calcium: 8.8 mg/dL (ref 8.4–10.5)
Chloride: 106 mEq/L (ref 96–112)
Creatinine, Ser: 1.48 mg/dL — ABNORMAL HIGH (ref 0.50–1.35)
Glucose, Bld: 200 mg/dL — ABNORMAL HIGH (ref 70–99)
Sodium: 140 mEq/L (ref 135–145)

## 2013-05-22 LAB — CBC
HCT: 39.1 % (ref 39.0–52.0)
Hemoglobin: 13.7 g/dL (ref 13.0–17.0)
MCH: 32.2 pg (ref 26.0–34.0)
MCHC: 35 g/dL (ref 30.0–36.0)
MCV: 92 fL (ref 78.0–100.0)
Platelets: 178 10*3/uL (ref 150–400)
RBC: 4.25 MIL/uL (ref 4.22–5.81)
WBC: 15.3 10*3/uL — ABNORMAL HIGH (ref 4.0–10.5)

## 2013-05-22 LAB — GLUCOSE, CAPILLARY: Glucose-Capillary: 257 mg/dL — ABNORMAL HIGH (ref 70–99)

## 2013-05-22 MED ORDER — DOCUSATE SODIUM 100 MG PO CAPS: ORAL_CAPSULE | ORAL | Status: DC

## 2013-05-22 MED ORDER — OXYCODONE HCL 5 MG PO TABS: ORAL_TABLET | ORAL | Status: DC

## 2013-05-22 MED ORDER — SODIUM CHLORIDE 0.9 % IV BOLUS (SEPSIS): 500.0000 mL | Freq: Once | INTRAVENOUS | Status: DC

## 2013-05-22 MED ORDER — ASPIRIN EC 81 MG PO TBEC: DELAYED_RELEASE_TABLET | ORAL | Status: DC

## 2013-05-22 MED ORDER — IRBESARTAN 300 MG PO TABS: 300.0000 mg | ORAL_TABLET | Freq: Every day | ORAL | Status: DC

## 2013-05-22 MED ORDER — SODIUM CHLORIDE 0.9 % IV BOLUS (SEPSIS)
500.0000 mL | Freq: Once | INTRAVENOUS | Status: AC
  Administered 2013-05-22: 500 mL via INTRAVENOUS

## 2013-05-22 MED ORDER — RIVAROXABAN 10 MG PO TABS: ORAL_TABLET | ORAL | Status: DC

## 2013-05-22 MED ORDER — ZOLPIDEM TARTRATE 5 MG PO TABS: 5.0000 mg | ORAL_TABLET | Freq: Every evening | ORAL | Status: DC | PRN

## 2013-05-22 MED ORDER — RIVAROXABAN 10 MG PO TABS
10.0000 mg | ORAL_TABLET | Freq: Every day | ORAL | Status: DC
  Administered 2013-05-22: 10 mg via ORAL
  Filled 2013-05-22 (×2): qty 1

## 2013-05-22 MED ORDER — HYDROCHLOROTHIAZIDE 25 MG PO TABS: 25.0000 mg | ORAL_TABLET | Freq: Every day | ORAL | Status: DC

## 2013-05-22 MED ORDER — BISACODYL 5 MG PO TBEC: DELAYED_RELEASE_TABLET | ORAL | Status: DC

## 2013-05-22 NOTE — Progress Notes (Signed)
OT Cancellation Note and Discharge  Patient Details Name: Howard Charles MRN: [id number] DOB: November 29, 1935   Cancelled Treatment:    Reason Eval/Treat Not Completed: OT screened, no needs identified, will sign off. Pt had the other knee done at the first of this year and has all the needed DME and A at home, no OT needs identified.  Howard Charles, Howard Charles [phone] 05/22/2013, 12:46 PM

## 2013-05-22 NOTE — Care Management Note (Signed)
CARE MANAGEMENT NOTE 05/22/2013  Patient:  TRAVELLE, MCCLIMANS   Account Number:  [id number]  Date Initiated:  05/22/2013  Documentation initiated by:  Vance Peper  Subjective/Objective Assessment:   77 yr old male s/p right total knee arthroplasty.     Action/Plan:   CM spoke with patient and wife concerning home health and DME needs at discharge. Patient preoperatively setup with [facility], no changes. DME has been delivered.   Anticipated DC Date:  05/22/2013   Anticipated DC Plan:  HOME W HOME HEALTH SERVICES      DC Planning Services  CM consult      [facility] Choice  HOME HEALTH  DURABLE MEDICAL EQUIPMENT   Choice offered to / List presented to:  C-1 Patient      DME agency  [employer]     HH arranged  HH-2 PT      [facility] agency  [facility]   Status of service:  Completed, signed off Discharge Disposition:  HOME W HOME HEALTH SERVICES

## 2013-05-22 NOTE — Progress Notes (Signed)
Physical Therapy Treatment Patient Details Name: Howard Charles MRN: [id number] DOB: May 11, 1936 Today's Date: 05/22/2013 Time: 9562-1308 PT Time Calculation (min): 22 min  PT Assessment / Plan / Recommendation  History of Present Illness Patient is a 77 yo male s/p Rt TKA.  Patient had Lt TKA in January 2014.   PT Comments   Moving quite well; on track for dc later today; Stair training complete   Follow Up Recommendations  Home health PT;Supervision/Assistance - 24 hour     Does the patient have the potential to tolerate intense rehabilitation     Barriers to Discharge        Equipment Recommendations       Recommendations for Other Services    Frequency 7X/week   Progress towards PT Goals Progress towards PT goals: Progressing toward goals  Plan Current plan remains appropriate    Precautions / Restrictions Precautions Precautions: Knee Precaution Booklet Issued: Yes (comment) Precaution Comments: Reviewed precautions with patient Required Braces or Orthoses: Knee Immobilizer - Right Knee Immobilizer - Right: On when out of bed or walking Restrictions Weight Bearing Restrictions: Yes RLE Weight Bearing: Weight bearing as tolerated   Pertinent Vitals/Pain Minimal pain post amb patient repositioned for comfort and optimal knee extension     Mobility  Bed Mobility Bed Mobility: Supine to Sit;Sitting - Scoot to Edge of Bed Supine to Sit: 4: Min assist;With rails;HOB elevated Sitting - Scoot to Edge of Bed: 4: Min guard;With rail Details for Bed Mobility Assistance: Educated patient on donning KI on RLE.  Verbal cues for technique for bed mobility.  Assist with bringing RLE off of bed, and raising trunk to sitting position.  Once upright, patient with good balance. Transfers Transfers: Sit to Stand;Stand to [employer] Transfers Sit to Stand: With upper extremity assist;From bed;4: Min guard Stand to Sit: With upper extremity assist;With armrests;To  chair/3-in-1;4: Min guard Details for Transfer Assistance: Verbal cues for hand placement and safe use of RW. Able to rise form low seat without armrests Ambulation/Gait Ambulation/Gait Assistance: 4: Min guard;5: Supervision Ambulation Distance (Feet): 240 Feet Assistive device: Rolling walker Ambulation/Gait Assistance Details: No knee buckling noted Gait Pattern: Step-through pattern Stairs: Yes Stairs Assistance: 4: Min guard Stair Management Technique: One rail Left;Sideways Number of Stairs: 2    Exercises     PT Diagnosis:    PT Problem List:   PT Treatment Interventions:     PT Goals (current goals can now be found in the care plan section) Acute Rehab PT Goals Patient Stated Goal: To get to go home soon PT Goal Formulation: With patient Time For Goal Achievement: 05/28/13 Potential to Achieve Goals: Good  Visit Information  Last PT Received On: 05/22/13 Assistance Needed: +1 History of Present Illness: Patient is a 77 yo male s/p Rt TKA.  Patient had Lt TKA in January 2014.    Subjective Data  Subjective: Feeling good Patient Stated Goal: To get to go home soon   Cognition  Cognition Arousal/Alertness: Awake/alert Behavior During Therapy: WFL for tasks assessed/performed Overall Cognitive Status: Within Functional Limits for tasks assessed    Balance  Balance Balance Assessed: Yes Static Sitting Balance Static Sitting - Balance Support: No upper extremity supported;Feet supported Static Sitting - Level of Assistance: 5: Stand by assistance Static Standing Balance Static Standing - Balance Support: Bilateral upper extremity supported Static Standing - Level of Assistance: 4: Min assist  End of Session PT - End of Session Equipment Utilized During Treatment: Gait belt;Right knee immobilizer;Oxygen  Activity Tolerance: Patient tolerated treatment well Patient left: in chair;with call bell/phone within reach Nurse Communication: Mobility status (Nausea)    GP     Van Clines [facility] [place], [place] [phone]  05/22/2013, 12:53 PM

## 2013-05-22 NOTE — Discharge Summary (Signed)
Patient ID: Howard Charles MRN: [id number] DOB/AGE: 75-Sep-1937 75 y.o.  Admit date: 05/21/2013 Discharge date: 05/22/2013  Admission Diagnoses:  Principal Problem:   Osteoarthritis of right knee Active Problems:   COPD   Cardiac arrhythmia  has episodes of atrial flutter and runs of VTach   Left knee DJD   Diabetes mellitus type 2 in obese   Diabetic neuropathy   Hypertension   GERD (gastroesophageal reflux disease)   Gout   Right knee DJD   Bilateral hearing loss   Chronic renal insufficiency, stage III (moderate)   Discharge Diagnoses:  Same  Past Medical History  Diagnosis Date  . Diabetes mellitus type 2 in obese   . Diabetic neuropathy   . Hypertension   . GERD (gastroesophageal reflux disease)   . Gout   . Left knee DJD   . Right knee DJD   . PONV (postoperative nausea and vomiting)     H/O ....BUT LAST FEW TIMES---NONE  . Bilateral hearing loss     WEARS HEARING AIDS........RIGHT IS BETTER THEN LEFT  . Chronic renal insufficiency, stage III (moderate)     Creatinine 1.47  . Dysrhythmia     h/o A Fib  . On supplemental oxygen therapy     at night    Surgeries: Procedure(s): TOTAL KNEE ARTHROPLASTY on 05/21/2013   Consultants:    Discharged Condition: Improved  Hospital Course: Howard Charles is an 77 y.o. male who was admitted 05/21/2013 for operative treatment ofOsteoarthritis of right knee. Patient has severe unremitting pain that affects sleep, daily activities, and work/hobbies. After pre-op clearance the patient was taken to the operating room on 05/21/2013 and underwent  Procedure(s): TOTAL KNEE ARTHROPLASTY.    Patient was given perioperative antibiotics: Anti-infectives   Start     Dose/Rate Route Frequency Ordered Stop   05/21/13 1700  ceFAZolin (ANCEF) IVPB 1 g/50 mL premix     1 g 100 mL/hr over 30 Minutes Intravenous Every 6 hours 05/21/13 1527 05/21/13 2333   05/21/13 1039  ceFAZolin (ANCEF) 2-3 GM-% IVPB SOLR    Comments:   Rogelia Mire   : cabinet override      05/21/13 1039 05/21/13 2244   05/21/13 0600  ceFAZolin (ANCEF) IVPB 2 g/50 mL premix     2 g 100 mL/hr over 30 Minutes Intravenous On call to O.R. 05/20/13 1308 05/21/13 1102       Patient was given sequential compression devices, early ambulation, and chemoprophylaxis to prevent DVT.  Patient benefited maximally from hospital stay and there were no complications.    Recent vital signs: Patient Vitals for the past 24 hrs:  BP Temp Pulse Resp SpO2 Height Weight  05/22/13 1200 - - - 18 - - -  05/22/13 0800 - - - 18 - - -  05/22/13 0529 135/75 mmHg 98.2 F (36.8 C) 84 16 95 % - -  05/21/13 2039 155/76 mmHg 97.5 F (36.4 C) 79 16 98 % - -  05/21/13 1723 155/77 mmHg - 64 - - - -  05/21/13 1600 - - - 18 96 % - -  05/21/13 1538 147/86 mmHg 97.1 F (36.2 C) 77 18 99 % 5\' 10"  (1.778 m) 101.878 kg (224 lb 9.6 oz)  05/21/13 1454 139/81 mmHg 97.3 F (36.3 C) 72 13 97 % - -  05/21/13 1430 146/81 mmHg - 74 17 97 % - -  05/21/13 1415 148/86 mmHg - 69 11 95 % - -  05/21/13 1400 151/90 mmHg -  72 15 95 % - -  05/21/13 1345 155/89 mmHg - 75 15 98 % - -     Recent laboratory studies:  Recent Labs  05/22/13 0527  WBC 15.3*  HGB 13.7  HCT 39.1  PLT 178  NA 140  K 4.4  CL 106  CO2 22  BUN 30*  CREATININE 1.48*  GLUCOSE 200*  CALCIUM 8.8     Discharge Medications:     Medication List         acetaminophen 325 MG tablet  Commonly known as:  TYLENOL  Take 650 mg by mouth every 6 (six) hours as needed. For pain/fever     allopurinol 300 MG tablet  Commonly known as:  ZYLOPRIM  Take 300 mg by mouth daily.     aspirin EC 81 MG tablet  1 tablet a day.  Do NOT TAKE until June 04, 2013     bisacodyl 5 MG EC tablet  Commonly known as:  DULCOLAX  Take 2 tablets every night with dinner until bowel movement.  LAXITIVE.  Restart if two days since last bowel movement     carvedilol 6.25 MG tablet  Commonly known as:  COREG  Take 6.25 mg  by mouth 2 (two) times daily with a meal.     CENTRUM SILVER ULTRA MENS PO  Take 1 tablet by mouth daily.     docusate sodium 100 MG capsule  Commonly known as:  COLACE  1 tab 2 times a day while on narcotics.  STOOL SOFTENER     FISH OIL PO  Take 1 mg by mouth daily.     insulin glargine 100 UNIT/ML injection  Commonly known as:  LANTUS  Inject 52 Units into the skin daily before breakfast.     ONGLYZA 5 MG Tabs tablet  Generic drug:  saxagliptin HCl  Take 5 mg by mouth daily.     oxyCODONE 5 MG immediate release tablet  Commonly known as:  Oxy IR/ROXICODONE  1-2 tablets every 4-6 hrs as needed for pain     pantoprazole 40 MG tablet  Commonly known as:  PROTONIX  Take 40 mg by mouth daily.     rivaroxaban 10 MG Tabs tablet  Commonly known as:  XARELTO  1 tablet daily by mouth until January 5.  Then may resume 20 mg dose     rosuvastatin 10 MG tablet  Commonly known as:  CRESTOR  Take 10 mg by mouth daily.     telmisartan-hydrochlorothiazide 80-25 MG per tablet  Commonly known as:  MICARDIS HCT  Take 1 tablet by mouth daily.     zolpidem 5 MG tablet  Commonly known as:  AMBIEN  Take 1 tablet (5 mg total) by mouth at bedtime as needed for sleep.        Diagnostic Studies: Dg Chest 2 View  05/14/2013   CLINICAL DATA:  Right total knee arthroplasty.  EXAM: CHEST  2 VIEW  COMPARISON:  06/20/2012.  FINDINGS: The heart size and mediastinal contours are within normal limits. Both lungs are clear. The visualized skeletal structures are unremarkable.  IMPRESSION: No active cardiopulmonary disease.   Electronically Signed   By: Elige Ko   On: 05/14/2013 13:20    Disposition: 06-Home-Health Care Svc      Discharge Orders   Future Orders Complete By Expires   Call MD / Call 911  As directed    Comments:     If you experience chest pain or shortness of  breath, CALL 911 and be transported to the hospital emergency room.  If you develope a fever above 101 F, pus  (white drainage) or increased drainage or redness at the wound, or calf pain, call your surgeon's office.   Change dressing  As directed    Comments:     Change the dressing daily with sterile 4 x 4 inch gauze dressing and apply TED hose.  You may clean the incision with alcohol prior to redressing.   Constipation Prevention  As directed    Comments:     Drink plenty of fluids.  Prune juice may be helpful.  You may use a stool softener, such as Colace (over the counter) 100 mg twice a day.  Use MiraLax (over the counter) for constipation as needed.   CPM  As directed    Comments:     Continuous passive motion machine (CPM):      Use the CPM from 0 to 90 for 6 hours per day.       You may break it up into 2 or 3 sessions per day.      Use CPM for 2 weeks or until you are told to stop.   Diet - low sodium heart healthy  As directed    Discharge instructions  As directed    Comments:     Total Knee Replacement Care After Refer to this sheet in the next few weeks. These discharge instructions provide you with general information on caring for yourself after you leave the hospital. Your caregiver may also give you specific instructions. Your treatment has been planned according to the most current medical practices available, but unavoidable complications sometimes occur. If you have any problems or questions after discharge, please call your caregiver. Regaining a near full range of motion of your knee within the first 3 to 6 weeks after surgery is critical. HOME CARE INSTRUCTIONS  You may resume a normal diet and activities as directed.  Perform exercises as directed.  Place gray foam block, curve side up under heel at all times except when in CPM or when walking.  DO NOT modify, tear, cut, or change in any way the gray foam block. You will receive physical therapy daily  Take showers instead of baths until informed otherwise.  You may shower on Sunday.  Please wash whole leg including wound  with soap and water  Change bandages (dressings)daily It is OK to take over-the-counter tylenol in addition to the oxycodone for pain, discomfort, or fever. Oxycodone is VERY constipating.  Please take stool softener twice a day and laxatives daily until bowels are regular Eat a well-balanced diet.  Avoid lifting or driving until you are instructed otherwise.  Make an appointment to see your caregiver for stitches (suture) or staple removal as directed.  If you have been sent home with a continuous passive motion machine (CPM machine), 0-90 degrees 6 hrs a day   2 hrs a shift SEEK MEDICAL CARE IF: You have swelling of your calf or leg.  You develop shortness of breath or chest pain.  You have redness, swelling, or increasing pain in the wound.  There is pus or any unusual drainage coming from the surgical site.  You notice a bad smell coming from the surgical site or dressing.  The surgical site breaks open after sutures or staples have been removed.  There is persistent bleeding from the suture or staple line.  You are getting worse or are not improving.  You  have any other questions or concerns.  SEEK IMMEDIATE MEDICAL CARE IF:  You have a fever.  You develop a rash.  You have difficulty breathing.  You develop any reaction or side effects to medicines given.  Your knee motion is decreasing rather than improving.  MAKE SURE YOU:  Understand these instructions.  Will watch your condition.  Will get help right away if you are not doing well or get worse.   Do not put a pillow under the knee. Place it under the heel.  As directed    Comments:     Place gray foam block, curve side up under heel at all times except when in CPM or when walking.  DO NOT modify, tear, cut, or change in any way the gray foam block.   Increase activity slowly as tolerated  As directed    TED hose  As directed    Comments:     Use stockings (TED hose) for 2 weeks on both leg(s).  You may remove them at night  for sleeping.      Follow-up Information   Follow up with Nilda Simmer, MD On 06/05/2013. (appt time 9:30)    Specialty:  Orthopedic Surgery   Contact information:   [address] [place] [address] [phone]        Signed: Pascal Lux 05/22/2013, 1:31 PM

## 2014-08-21 ENCOUNTER — Other Ambulatory Visit (HOSPITAL_COMMUNITY): Payer: Self-pay | Admitting: Radiology

## 2014-08-21 DIAGNOSIS — R0600 Dyspnea, unspecified: Secondary | ICD-10-CM

## 2014-08-29 ENCOUNTER — Ambulatory Visit (INDEPENDENT_AMBULATORY_CARE_PROVIDER_SITE_OTHER): Payer: Medicare Other

## 2014-08-29 ENCOUNTER — Ambulatory Visit (INDEPENDENT_AMBULATORY_CARE_PROVIDER_SITE_OTHER): Payer: Medicare Other | Admitting: Podiatry

## 2014-08-29 ENCOUNTER — Encounter: Payer: Self-pay | Admitting: Podiatry

## 2014-08-29 VITALS — Ht 70.0 in | Wt 220.0 lb

## 2014-08-29 DIAGNOSIS — M79673 Pain in unspecified foot: Secondary | ICD-10-CM | POA: Diagnosis not present

## 2014-08-29 DIAGNOSIS — L600 Ingrowing nail: Secondary | ICD-10-CM

## 2014-08-29 DIAGNOSIS — L84 Corns and callosities: Secondary | ICD-10-CM

## 2014-08-29 DIAGNOSIS — M779 Enthesopathy, unspecified: Secondary | ICD-10-CM | POA: Diagnosis not present

## 2014-08-29 MED ORDER — TRIAMCINOLONE ACETONIDE 10 MG/ML IJ SUSP
10.0000 mg | Freq: Once | INTRAMUSCULAR | Status: AC
  Administered 2014-08-29: 10 mg

## 2014-08-29 NOTE — Progress Notes (Signed)
   Subjective:    Patient ID: Howard Charles, male    DOB: 02-Feb-1936, 79 y.o.   MRN: [id number]  HPI Comments: Pt states he kicked a vacuum cleaner 2 to 4 nights ago, injuring his right 1st toe.  Pt states his wife has been shaving the hard core on the plantar left 5th MPJ periodically over the last 2 - 3 weeks.     Review of Systems  Musculoskeletal: Positive for arthralgias.       Pt complains of hip pain.  All other systems reviewed and are negative.      Objective:   Physical Exam        Assessment & Plan:

## 2014-08-29 NOTE — Progress Notes (Signed)
Subjective:     Patient ID: Howard Charles, male   DOB: June 11, 1935, 79 y.o.   MRN: [id number]  HPI patient has damaged hallux nail secondary to injury 3 days ago but states the nail has been chronically damaged and thickened on his right big toenail and he like to have it removed and presents with pain underneath his fifth metatarsal head left with fluid buildup and lesion formation that he states is very tender   Review of Systems  All other systems reviewed and are negative.      Objective:   Physical Exam  Constitutional: He is oriented to person, place, and time.  Cardiovascular: Intact distal pulses.   Musculoskeletal: Normal range of motion.  Neurological: He is oriented to person, place, and time.  Skin: Skin is warm.  Nursing note and vitals reviewed.  neurovascular status intact with muscle strength adequate and range of motion subtalar midtarsal joint within normal limits. Patient's noted to have good digital perfusion is well oriented 3 and I did note that there is a thick dystrophic hallux nail right that is loose when pressed and makes it hard to walk with comfortably. His left fifth MPJ has fluid buildup with keratotic lesion formation and he has trouble walking on that side of his foot for extended period of time     Assessment:     Damaged hallux nail right with dystrophic type nailbed and plantar flexed fifth metatarsal with inflammatory capsulitis fifth MPJ left foot with lesion formation    Plan:     H&P and different conditions discussed with patient. Today I recommended removal of hallux nail and I explained procedure and risk. Patient wants surgery and I infiltrated the right hallux 60 mg like Marcaine mixture remove the hallux nail exposed matrix and applied 5 applications phenol 30 seconds followed by alcohol lavage and sterile dressing. Injected the left fifth MPJ plantar capsule 3 mg Dexon some Kenalog 5 g Xylocaine and did deep debridement of lesion and  reappoint

## 2014-08-29 NOTE — Patient Instructions (Signed)

## 2014-09-03 ENCOUNTER — Ambulatory Visit (HOSPITAL_COMMUNITY)
Admission: RE | Admit: 2014-09-03 | Discharge: 2014-09-03 | Disposition: A | Discharge status: Home / Self Care | Payer: Medicare Other | Source: Ambulatory Visit | Attending: Internal Medicine | Admitting: Internal Medicine

## 2014-09-03 DIAGNOSIS — R0600 Dyspnea, unspecified: Secondary | ICD-10-CM | POA: Diagnosis not present

## 2014-09-03 LAB — PULMONARY FUNCTION TEST
DL/VA % PRED: 95 %
DL/VA: 4.29 ml/min/mmHg/L
DLCO UNC % PRED: 43 %
DLCO UNC: 13.26 ml/min/mmHg
FEF 25-75 POST: 1.66 L/s
FEF 25-75 Pre: 3.69 L/sec
FEF2575-%Change-Post: -55 %
FEF2575-%Pred-Post: 83 %
FEF2575-%Pred-Pre: 185 %
FEV1-%Change-Post: -9 %
FEV1-%Pred-Post: 97 %
FEV1-%Pred-Pre: 106 %
FEV1-Post: 2.73 L
FEV1-Pre: 3 L
FEV1FVC-%Change-Post: 3 %
FEV1FVC-%Pred-Pre: 111 %
FEV6-%Change-Post: -10 %
FEV6-%PRED-POST: 88 %
FEV6-%Pred-Pre: 98 %
FEV6-Post: 3.22 L
FEV6-Pre: 3.61 L
FEV6FVC-%CHANGE-POST: 1 %
FEV6FVC-%Pred-Post: 105 %
FEV6FVC-%Pred-Pre: 104 %
FVC-%Change-Post: -12 %
FVC-%PRED-PRE: 95 %
FVC-%Pred-Post: 83 %
FVC-PRE: 3.72 L
FVC-Post: 3.27 L
PRE FEV1/FVC RATIO: 80 %
Post FEV1/FVC ratio: 83 %
Post FEV6/FVC ratio: 99 %
Pre FEV6/FVC Ratio: 97 %
RV % pred: 65 %
RV: 1.64 L
TLC % PRED: 79 %
TLC: 5.37 L

## 2014-09-03 MED ORDER — ALBUTEROL SULFATE (2.5 MG/3ML) 0.083% IN NEBU
2.5000 mg | INHALATION_SOLUTION | Freq: Once | RESPIRATORY_TRACT | Status: AC
  Administered 2014-09-03: 2.5 mg via RESPIRATORY_TRACT

## 2014-10-18 ENCOUNTER — Encounter: Payer: Self-pay | Admitting: Emergency Medicine

## 2014-10-18 ENCOUNTER — Ambulatory Visit (INDEPENDENT_AMBULATORY_CARE_PROVIDER_SITE_OTHER): Payer: Medicare Other | Admitting: Emergency Medicine

## 2014-10-18 VITALS — BP 168/88 | HR 73 | Wt 224.0 lb

## 2014-10-18 DIAGNOSIS — R059 Cough, unspecified: Secondary | ICD-10-CM | POA: Insufficient documentation

## 2014-10-18 DIAGNOSIS — R05 Cough: Secondary | ICD-10-CM

## 2014-10-18 DIAGNOSIS — R0600 Dyspnea, unspecified: Secondary | ICD-10-CM

## 2014-10-18 NOTE — Progress Notes (Signed)
Subjective:    Patient ID: Howard Charles, male    DOB: 1935-07-08, 79 y.o.   MRN: [id number]  HPI 79 year old never smoker with a history of hypertension, diabetes, GERD, chronic renal insufficiency, Atrial Fib. He has been evaluated by Dr Shelia Media for cough. Underwent pulmonary function testing 08/21/14. This is reviewed by me and shows normal spirometry with normal flow volume curve, a normal TLC with a decreased RV consistent with possible restriction, and inability to correctly perform the DLCO (note low Va). Beginning in March has had a sensation when he lays down that he is dyspneic, cannot get a deep breath, a discomfort  He saw Dr Nadyne Coombes and was cleared. The discomfort seemed to get better when his PPI was increased. He put on about 20 lbs over 20 yrs. He is able to go to the gym, ride recumbent bike.   Also developed some cough prior to this time period - Dr Shelia Media increased his pantoprazole to bid and it helped the cough some. He is now back to once a day. The cough increasede slightly when he decreased the med.   He had a PSG 3-4 yrs ago, did not show OSA but did show nocturnal hypoxemia.   CXR 07/2014 was normal     Review of Systems  Constitutional: Negative for fever and unexpected weight change.  HENT: Negative for congestion, dental problem, ear pain, nosebleeds, postnasal drip, rhinorrhea, sinus pressure, sneezing, sore throat and trouble swallowing.   Eyes: Negative for redness and itching.  Respiratory: Positive for cough and shortness of breath. Negative for chest tightness and wheezing.   Cardiovascular: Negative for palpitations and leg swelling.  Gastrointestinal: Negative for nausea and vomiting.  Genitourinary: Negative for dysuria.  Musculoskeletal: Negative for joint swelling.  Skin: Negative for rash.  Neurological: Negative for headaches.  Hematological: Does not bruise/bleed easily.  Psychiatric/Behavioral: Negative for dysphoric mood. The patient is not  nervous/anxious.    Past Medical History  Diagnosis Date  . Diabetes mellitus type 2 in obese   . Diabetic neuropathy   . Hypertension   . GERD (gastroesophageal reflux disease)   . Gout   . Left knee DJD   . Right knee DJD   . PONV (postoperative nausea and vomiting)     H/O ....BUT LAST FEW TIMES---NONE  . Bilateral hearing loss     WEARS HEARING AIDS........RIGHT IS BETTER THEN LEFT  . Chronic renal insufficiency, stage III (moderate)     Creatinine 1.47  . Dysrhythmia     h/o A Fib  . On supplemental oxygen therapy     at night     Family History  Problem Relation Age of Onset  . Hypertension Mother   . Diabetes Mother   . Heart disease Mother   . Heart disease Father   . Heart attack Father   . Hypertension Father   . Diabetes Mellitus II Father      History   Social History  . Marital Status: Married    Spouse Name: N/A  . Number of Children: N/A  . Years of Education: N/A   Occupational History  . Not on file.   Social History Main Topics  . Smoking status: Never Smoker   . Smokeless tobacco: Never Used  . Alcohol Use: No  . Drug Use: No  . Sexual Activity: Not on file   Other Topics Concern  . Not on file   Social History Narrative     No Known Allergies  Outpatient Prescriptions Prior to Visit  Medication Sig Dispense Refill  . acetaminophen (TYLENOL) 325 MG tablet Take 650 mg by mouth every 6 (six) hours as needed. For pain/fever    . allopurinol (ZYLOPRIM) 300 MG tablet Take 300 mg by mouth daily.    . carvedilol (COREG) 6.25 MG tablet Take 6.25 mg by mouth 2 (two) times daily with a meal.    . insulin glargine (LANTUS) 100 UNIT/ML injection Inject 52 Units into the skin daily before breakfast.     . Multiple Vitamins-Minerals (CENTRUM SILVER ULTRA MENS PO) Take 1 tablet by mouth daily.    . Omega-3 Fatty Acids (FISH OIL PO) Take 1 mg by mouth daily.    Marland Kitchen oxyCODONE (OXY IR/ROXICODONE) 5 MG immediate release tablet 1-2 tablets every 4-6  hrs as needed for pain 100 tablet 0  . pantoprazole (PROTONIX) 40 MG tablet Take 40 mg by mouth daily.    . rosuvastatin (CRESTOR) 10 MG tablet Take 10 mg by mouth daily.    . saxagliptin HCl (ONGLYZA) 5 MG TABS tablet Take 5 mg by mouth daily.    Marland Kitchen telmisartan-hydrochlorothiazide (MICARDIS HCT) 80-25 MG per tablet Take 1 tablet by mouth daily.    . rivaroxaban (XARELTO) 10 MG TABS tablet 1 tablet daily by mouth until January 5.  Then may resume 20 mg dose 14 tablet 0   No facility-administered medications prior to visit.         Objective:   Physical Exam Filed Vitals:   10/18/14 1529 10/18/14 1530  BP:  168/88  Pulse:  73  Weight: 224 lb (101.606 kg)   SpO2:  96%   Gen: Pleasant, well-nourished, in no distress,  normal affect  ENT: No lesions,  mouth clear,  oropharynx clear, no postnasal drip  Neck: No JVD, no TMG, no carotid bruits  Lungs: No use of accessory muscles, no dullness to percussion, clear without rales or rhonchi  Cardiovascular: RRR, heart sounds normal, no murmur or gallops, no peripheral edema  Musculoskeletal: No deformities, no cyanosis or clubbing  Neuro: alert, non focal  Skin: Warm, no lesions or rashes      Assessment & Plan:  Cough His cough actually improved when Dr. Shelia Media increased his pantoprazole 40 mg to twice a day. He also notes that he had rhinitis at the time when his cough was bothersome. I suspect that both GERD and allergic rhinitis for factors and that it was worst in the spring. I asked him to discuss increasing the pantoprazole back to twice a day if he felt like the cough became more bothersome. Also I recommended that he use fluticasone nasal spray and loratadine during the spring and fall months   Dyspnea Etiology was not clear but it was interesting that this was only happening at night. He had a reassuring chest x-ray and a reassuring cardiology evaluation. Interestingly the sensation in his chest and the short windedness  seemed to improve when he was on increased pantoprazole suggesting that GERD and cough may have played a role in this as well. I suspect a 20 pound weight gain is responsible for his restrictive pattern on PFT. He has absolutely no obstructive lung disease and does not need bronchodilators. His diffusion capacity is low but this is because he did not perform the test to goal. He did not desaturate on a relation today in the office and with a clear chest x-ray I do not believe he needs any other workup for hypoxemia or the diffusion  abnormality.Marland Kitchen

## 2014-10-18 NOTE — Assessment & Plan Note (Signed)
Etiology was not clear but it was interesting that this was only happening at night. He had a reassuring chest x-ray and a reassuring cardiology evaluation. Interestingly the sensation in his chest and the short windedness seemed to improve when he was on increased pantoprazole suggesting that GERD and cough may have played a role in this as well. I suspect a 20 pound weight gain is responsible for his restrictive pattern on PFT. He has absolutely no obstructive lung disease and does not need bronchodilators. His diffusion capacity is low but this is because he did not perform the test to goal. He did not desaturate on a relation today in the office and with a clear chest x-ray I do not believe he needs any other workup for hypoxemia or the diffusion abnormality.Marland Kitchen

## 2014-10-18 NOTE — Patient Instructions (Addendum)
We will perform walking oximetry today. If your oxygen level is normal exertion that I do not believe he need have any other workup or repeat pulmonary function testing You may want to discuss with Dr. Concha Pyo potentially increasing your pantoprazole back to twice a day depending on how your cough is doing You should try using fluticasone (Flonase) nasal spray, 2 sprays each nostril daily during the allergy season You should try using loratadine 10 mg daily during the allergy season Keep working on your exercise and weight loss routine Follow with Dr Lamonte Sakai as needed

## 2014-10-18 NOTE — Assessment & Plan Note (Signed)
His cough actually improved when Dr. Shelia Media increased his pantoprazole 40 mg to twice a day. He also notes that he had rhinitis at the time when his cough was bothersome. I suspect that both GERD and allergic rhinitis for factors and that it was worst in the spring. I asked him to discuss increasing the pantoprazole back to twice a day if he felt like the cough became more bothersome. Also I recommended that he use fluticasone nasal spray and loratadine during the spring and fall months

## 2014-10-19 ENCOUNTER — Inpatient Hospital Stay (HOSPITAL_COMMUNITY)
Admission: EM | Admit: 2014-10-19 | Discharge: 2014-11-12 | DRG: 477 | Disposition: A | Discharge status: Skilled Nursing Facility | Payer: Medicare Other | Attending: Internal Medicine | Admitting: Internal Medicine

## 2014-10-19 ENCOUNTER — Encounter (HOSPITAL_COMMUNITY): Payer: Self-pay | Admitting: Emergency Medicine

## 2014-10-19 ENCOUNTER — Emergency Department (HOSPITAL_COMMUNITY): Payer: Medicare Other

## 2014-10-19 DIAGNOSIS — Z7901 Long term (current) use of anticoagulants: Secondary | ICD-10-CM | POA: Diagnosis not present

## 2014-10-19 DIAGNOSIS — G8918 Other acute postprocedural pain: Secondary | ICD-10-CM

## 2014-10-19 DIAGNOSIS — S72142A Displaced intertrochanteric fracture of left femur, initial encounter for closed fracture: Secondary | ICD-10-CM | POA: Diagnosis present

## 2014-10-19 DIAGNOSIS — N183 Chronic kidney disease, stage 3 unspecified: Secondary | ICD-10-CM | POA: Diagnosis present

## 2014-10-19 DIAGNOSIS — G309 Alzheimer's disease, unspecified: Secondary | ICD-10-CM | POA: Diagnosis present

## 2014-10-19 DIAGNOSIS — Z6830 Body mass index (BMI) 30.0-30.9, adult: Secondary | ICD-10-CM | POA: Diagnosis not present

## 2014-10-19 DIAGNOSIS — D72829 Elevated white blood cell count, unspecified: Secondary | ICD-10-CM | POA: Diagnosis present

## 2014-10-19 DIAGNOSIS — K560 Paralytic ileus: Secondary | ICD-10-CM

## 2014-10-19 DIAGNOSIS — S0990XA Unspecified injury of head, initial encounter: Secondary | ICD-10-CM

## 2014-10-19 DIAGNOSIS — W19XXXA Unspecified fall, initial encounter: Secondary | ICD-10-CM | POA: Diagnosis present

## 2014-10-19 DIAGNOSIS — E43 Unspecified severe protein-calorie malnutrition: Secondary | ICD-10-CM | POA: Diagnosis present

## 2014-10-19 DIAGNOSIS — S7292XA Unspecified fracture of left femur, initial encounter for closed fracture: Secondary | ICD-10-CM

## 2014-10-19 DIAGNOSIS — Z794 Long term (current) use of insulin: Secondary | ICD-10-CM | POA: Diagnosis not present

## 2014-10-19 DIAGNOSIS — I480 Paroxysmal atrial fibrillation: Secondary | ICD-10-CM | POA: Diagnosis present

## 2014-10-19 DIAGNOSIS — K567 Ileus, unspecified: Secondary | ICD-10-CM | POA: Diagnosis not present

## 2014-10-19 DIAGNOSIS — M25552 Pain in left hip: Secondary | ICD-10-CM | POA: Diagnosis present

## 2014-10-19 DIAGNOSIS — F0280 Dementia in other diseases classified elsewhere without behavioral disturbance: Secondary | ICD-10-CM | POA: Diagnosis present

## 2014-10-19 DIAGNOSIS — C61 Malignant neoplasm of prostate: Secondary | ICD-10-CM | POA: Diagnosis present

## 2014-10-19 DIAGNOSIS — Z96653 Presence of artificial knee joint, bilateral: Secondary | ICD-10-CM | POA: Diagnosis present

## 2014-10-19 DIAGNOSIS — E785 Hyperlipidemia, unspecified: Secondary | ICD-10-CM | POA: Diagnosis present

## 2014-10-19 DIAGNOSIS — I129 Hypertensive chronic kidney disease with stage 1 through stage 4 chronic kidney disease, or unspecified chronic kidney disease: Secondary | ICD-10-CM | POA: Diagnosis present

## 2014-10-19 DIAGNOSIS — K913 Postprocedural intestinal obstruction: Secondary | ICD-10-CM | POA: Diagnosis not present

## 2014-10-19 DIAGNOSIS — E1140 Type 2 diabetes mellitus with diabetic neuropathy, unspecified: Secondary | ICD-10-CM | POA: Diagnosis present

## 2014-10-19 DIAGNOSIS — E876 Hypokalemia: Secondary | ICD-10-CM | POA: Diagnosis not present

## 2014-10-19 DIAGNOSIS — E669 Obesity, unspecified: Secondary | ICD-10-CM | POA: Diagnosis present

## 2014-10-19 DIAGNOSIS — E1122 Type 2 diabetes mellitus with diabetic chronic kidney disease: Secondary | ICD-10-CM | POA: Diagnosis present

## 2014-10-19 DIAGNOSIS — Z6832 Body mass index (BMI) 32.0-32.9, adult: Secondary | ICD-10-CM | POA: Diagnosis not present

## 2014-10-19 DIAGNOSIS — K219 Gastro-esophageal reflux disease without esophagitis: Secondary | ICD-10-CM | POA: Diagnosis present

## 2014-10-19 DIAGNOSIS — S72002A Fracture of unspecified part of neck of left femur, initial encounter for closed fracture: Secondary | ICD-10-CM

## 2014-10-19 DIAGNOSIS — E1169 Type 2 diabetes mellitus with other specified complication: Secondary | ICD-10-CM

## 2014-10-19 DIAGNOSIS — I4891 Unspecified atrial fibrillation: Secondary | ICD-10-CM | POA: Diagnosis present

## 2014-10-19 DIAGNOSIS — Z9889 Other specified postprocedural states: Secondary | ICD-10-CM

## 2014-10-19 DIAGNOSIS — H9193 Unspecified hearing loss, bilateral: Secondary | ICD-10-CM | POA: Diagnosis present

## 2014-10-19 DIAGNOSIS — T1490XA Injury, unspecified, initial encounter: Secondary | ICD-10-CM

## 2014-10-19 DIAGNOSIS — I10 Essential (primary) hypertension: Secondary | ICD-10-CM | POA: Diagnosis present

## 2014-10-19 DIAGNOSIS — Z8546 Personal history of malignant neoplasm of prostate: Secondary | ICD-10-CM | POA: Diagnosis not present

## 2014-10-19 DIAGNOSIS — Z8781 Personal history of (healed) traumatic fracture: Secondary | ICD-10-CM

## 2014-10-19 DIAGNOSIS — R110 Nausea: Secondary | ICD-10-CM

## 2014-10-19 DIAGNOSIS — K9189 Other postprocedural complications and disorders of digestive system: Secondary | ICD-10-CM

## 2014-10-19 DIAGNOSIS — N189 Chronic kidney disease, unspecified: Secondary | ICD-10-CM | POA: Diagnosis not present

## 2014-10-19 DIAGNOSIS — R140 Abdominal distension (gaseous): Secondary | ICD-10-CM

## 2014-10-19 DIAGNOSIS — S72002D Fracture of unspecified part of neck of left femur, subsequent encounter for closed fracture with routine healing: Secondary | ICD-10-CM | POA: Diagnosis not present

## 2014-10-19 DIAGNOSIS — R52 Pain, unspecified: Secondary | ICD-10-CM

## 2014-10-19 DIAGNOSIS — K56609 Unspecified intestinal obstruction, unspecified as to partial versus complete obstruction: Secondary | ICD-10-CM

## 2014-10-19 DIAGNOSIS — E119 Type 2 diabetes mellitus without complications: Secondary | ICD-10-CM | POA: Diagnosis not present

## 2014-10-19 HISTORY — DX: Other postprocedural complications and disorders of digestive system: K91.89

## 2014-10-19 HISTORY — DX: Ileus, unspecified: K56.7

## 2014-10-19 LAB — CBC WITH DIFFERENTIAL/PLATELET
BASOS ABS: 0.1 10*3/uL (ref 0.0–0.1)
BASOS PCT: 1 % (ref 0–1)
Eosinophils Absolute: 0.6 10*3/uL (ref 0.0–0.7)
Eosinophils Relative: 5 % (ref 0–5)
HCT: 44.4 % (ref 39.0–52.0)
HEMOGLOBIN: 15.2 g/dL (ref 13.0–17.0)
Lymphocytes Relative: 19 % (ref 12–46)
Lymphs Abs: 2.1 10*3/uL (ref 0.7–4.0)
MCH: 31.9 pg (ref 26.0–34.0)
MCHC: 34.2 g/dL (ref 30.0–36.0)
MCV: 93.3 fL (ref 78.0–100.0)
Monocytes Absolute: 1 10*3/uL (ref 0.1–1.0)
Monocytes Relative: 9 % (ref 3–12)
NEUTROS ABS: 7.2 10*3/uL (ref 1.7–7.7)
Neutrophils Relative %: 66 % (ref 43–77)
PLATELETS: 196 10*3/uL (ref 150–400)
RBC: 4.76 MIL/uL (ref 4.22–5.81)
RDW: 14.3 % (ref 11.5–15.5)
WBC: 10.9 10*3/uL — ABNORMAL HIGH (ref 4.0–10.5)

## 2014-10-19 LAB — BASIC METABOLIC PANEL
Anion gap: 12 (ref 5–15)
BUN: 30 mg/dL — ABNORMAL HIGH (ref 6–20)
CHLORIDE: 106 mmol/L (ref 101–111)
CO2: 22 mmol/L (ref 22–32)
Calcium: 9.3 mg/dL (ref 8.9–10.3)
Creatinine, Ser: 1.52 mg/dL — ABNORMAL HIGH (ref 0.61–1.24)
GFR calc Af Amer: 50 mL/min — ABNORMAL LOW (ref >60)
GFR, EST NON AFRICAN AMERICAN: 43 mL/min — AB (ref >60)
Glucose, Bld: 201 mg/dL — ABNORMAL HIGH (ref 65–99)
POTASSIUM: 4.2 mmol/L (ref 3.5–5.1)
Sodium: 140 mmol/L (ref 135–145)

## 2014-10-19 LAB — PROTIME-INR
INR: 1.64 — AB (ref 0.00–1.49)
PROTHROMBIN TIME: 19.5 s — AB (ref 11.6–15.2)

## 2014-10-19 LAB — TYPE AND SCREEN
ABO/RH(D): O POS
ANTIBODY SCREEN: NEGATIVE

## 2014-10-19 LAB — ABO/RH: ABO/RH(D): O POS

## 2014-10-19 MED ORDER — SODIUM CHLORIDE 0.9 % IV SOLN
1000.0000 mL | INTRAVENOUS | Status: DC
Start: 2014-10-19 — End: 2014-10-20
  Administered 2014-10-19: 1000 mL via INTRAVENOUS

## 2014-10-19 MED ORDER — DILTIAZEM HCL 25 MG/5ML IV SOLN
20.0000 mg | Freq: Once | INTRAVENOUS | Status: AC
  Administered 2014-10-19: 20 mg via INTRAVENOUS
  Filled 2014-10-19: qty 5

## 2014-10-19 MED ORDER — ONDANSETRON HCL 4 MG/2ML IJ SOLN
4.0000 mg | Freq: Once | INTRAMUSCULAR | Status: AC
  Administered 2014-10-19: 4 mg via INTRAVENOUS
  Filled 2014-10-19: qty 2

## 2014-10-19 MED ORDER — FENTANYL CITRATE (PF) 100 MCG/2ML IJ SOLN
50.0000 ug | INTRAMUSCULAR | Status: AC | PRN
  Administered 2014-10-19 (×2): 50 ug via INTRAVENOUS
  Filled 2014-10-19 (×2): qty 2

## 2014-10-19 NOTE — ED Notes (Signed)
MD at bedside. Ortho MD at bedside.

## 2014-10-19 NOTE — ED Provider Notes (Addendum)
CSN: [id number]     Arrival date & time 10/19/14  1447 History   First MD Initiated Contact with Patient 10/19/14 1503     Chief Complaint  Patient presents with  . Fall  . Hip Pain     (Consider location/radiation/quality/duration/timing/severity/associated sxs/prior Treatment) HPI Comments: The patient is a 79 year old male, history of bilateral knee replacement, has had left hip pain for some time and has been seen and evaluated by his orthopedist, has tried steroid injections into the joint, physical therapy and chiropractic manipulation without any significant relief. While he was walking in a store today he lost his balance secondary to his lower extremity pain and fell to the ground striking his left hip and his posterior occiput, there was no loss of consciousness, he was unable to get off the ground because of pain in the hip, he was helped to a chair by onlookers, his pain is severe and persistent and worse with any movement of the leg. He denies any numbness or weakness. His orthopedist is Dr. Percell   He does have a history of atrial fibrillation and is on Xarelto  Patient is a 79 y.o. male presenting with fall and hip pain. The history is provided by the patient, the spouse and the EMS personnel.  Fall This is a new problem. The current episode started less than 1 hour ago. The problem occurs constantly. The problem has not changed since onset.Pertinent negatives include no chest pain, no abdominal pain, no headaches and no shortness of breath. He has tried nothing for the symptoms.  Hip Pain This is a new problem. The current episode started less than 1 hour ago. The problem occurs constantly. The problem has not changed since onset.Pertinent negatives include no chest pain, no abdominal pain, no headaches and no shortness of breath. The symptoms are aggravated by walking. Nothing relieves the symptoms.    Past Medical History  Diagnosis Date  . Diabetes mellitus type 2 in obese    . Diabetic neuropathy   . Hypertension   . GERD (gastroesophageal reflux disease)   . Gout   . Left knee DJD   . Right knee DJD   . PONV (postoperative nausea and vomiting)     H/O ....BUT LAST FEW TIMES---NONE  . Bilateral hearing loss     WEARS HEARING AIDS........RIGHT IS BETTER THEN LEFT  . Chronic renal insufficiency, stage III (moderate)     Creatinine 1.47  . Dysrhythmia     h/o A Fib  . On supplemental oxygen therapy     at night   Past Surgical History  Procedure Laterality Date  . Knee arthroscopy  2008    left   . Knee arthroscopy  2012    left  . Knee arthroscopy  2002    right  . Hemorrhoid surgery    . Inner ear surgery    . Shoulder arthroscopy with rotator cuff repair  2007  . Suprapubic prostatectomy      19 YRS  AGO  . Cholecystectomy    . Tonsillectomy    . Total knee arthroplasty  06/26/2012    Procedure: TOTAL KNEE ARTHROPLASTY;  Surgeon: Lorn Junes, MD;  Location: [facility];  Service: Orthopedics;  Laterality: Left;  . Colonoscopy    . Total knee arthroplasty Right 05/21/2013    Procedure: TOTAL KNEE ARTHROPLASTY;  Surgeon: Lorn Junes, MD;  Location: [facility];  Service: Orthopedics;  Laterality: Right;   Family History  Problem Relation Age of Onset  .  Hypertension Mother   . Diabetes Mother   . Heart disease Mother   . Heart disease Father   . Heart attack Father   . Hypertension Father   . Diabetes Mellitus II Father    History  Substance Use Topics  . Smoking status: Never Smoker   . Smokeless tobacco: Never Used  . Alcohol Use: No    Review of Systems  Respiratory: Negative for shortness of breath.   Cardiovascular: Negative for chest pain.  Gastrointestinal: Negative for abdominal pain.  Neurological: Negative for headaches.  All other systems reviewed and are negative.     Allergies  Review of patient's allergies indicates no known allergies.  Home Medications   Prior to Admission medications   Medication Sig Start  Date End Date Taking? Authorizing Provider  allopurinol (ZYLOPRIM) 300 MG tablet Take 300 mg by mouth every morning.    Yes Historical Provider, MD  carvedilol (COREG) 6.25 MG tablet Take 6.25 mg by mouth 2 (two) times daily with a meal.   Yes Historical Provider, MD  insulin glargine (LANTUS) 100 UNIT/ML injection Inject 52 Units into the skin daily before breakfast.    Yes Historical Provider, MD  Multiple Vitamins-Minerals (CENTRUM SILVER ULTRA MENS PO) Take 1 tablet by mouth daily.   Yes Historical Provider, MD  omega-3 acid ethyl esters (LOVAZA) 1 G capsule Take 1 g by mouth every morning.   Yes Historical Provider, MD  pantoprazole (PROTONIX) 40 MG tablet Take 40 mg by mouth every evening.    Yes Historical Provider, MD  rivaroxaban (XARELTO) 20 MG TABS tablet Take 20 mg by mouth every morning.    Yes Historical Provider, MD  rosuvastatin (CRESTOR) 10 MG tablet Take 10 mg by mouth every evening.    Yes Historical Provider, MD  saxagliptin HCl (ONGLYZA) 5 MG TABS tablet Take 5 mg by mouth every morning.    Yes Historical Provider, MD  telmisartan-hydrochlorothiazide (MICARDIS HCT) 80-25 MG per tablet Take 1 tablet by mouth every morning.    Yes Historical Provider, MD  oxyCODONE (OXY IR/ROXICODONE) 5 MG immediate release tablet 1-2 tablets every 4-6 hrs as needed for pain Patient not taking: Reported on 10/19/2014 05/22/13   Kirstin Shepperson, PA-C   BP 136/84 mmHg  Pulse 99  Temp(Src) 98.2 F (36.8 C) (Oral)  Resp 18  SpO2 96% Physical Exam  Constitutional: He appears well-developed and well-nourished. No distress.  HENT:  Head: Normocephalic and atraumatic.  Mouth/Throat: Oropharynx is clear and moist. No oropharyngeal exudate.  Eyes: Conjunctivae and EOM are normal. Pupils are equal, round, and reactive to light. Right eye exhibits no discharge. Left eye exhibits no discharge. No scleral icterus.  Neck: Normal range of motion. Neck supple. No JVD present. No thyromegaly present.   Cardiovascular: Regular rhythm, normal heart sounds and intact distal pulses.  Exam reveals no gallop and no friction rub.   No murmur heard. Sinus tachycardia, normal pulses at the feet bilaterally  Pulmonary/Chest: Effort normal and breath sounds normal. No respiratory distress. He has no wheezes. He has no rales.  Abdominal: Soft. Bowel sounds are normal. He exhibits no distension and no mass. There is no tenderness.  Musculoskeletal: Normal range of motion. He exhibits tenderness ( Tenderness to palpation over the left lateral hip, there is no leg length discrepancies, there is pain with range of motion of the left hip, he is unable to straight leg raise, he has severe pain with internal and external rotation, he has normal pulses ). He  exhibits no edema.  Soft compartments, supple joints except for the left hip  Lymphadenopathy:    He has no cervical adenopathy.  Neurological: He is alert. Coordination normal.  Normal strength in all 4 extremities, left lower extremity is unable to lift off the bed secondary to pain  Skin: Skin is warm and dry. No rash noted. No erythema.  Psychiatric: He has a normal mood and affect. His behavior is normal.  Nursing note and vitals reviewed.   ED Course  Procedures (including critical care time) Labs Review Labs Reviewed  BASIC METABOLIC PANEL - Abnormal; Notable for the following:    Glucose, Bld 201 (*)    BUN 30 (*)    Creatinine, Ser 1.52 (*)    GFR calc non Af Amer 43 (*)    GFR calc Af Amer 50 (*)    All other components within normal limits  CBC WITH DIFFERENTIAL/PLATELET - Abnormal; Notable for the following:    WBC 10.9 (*)    All other components within normal limits  PROTIME-INR - Abnormal; Notable for the following:    Prothrombin Time 19.5 (*)    INR 1.64 (*)    All other components within normal limits  TYPE AND SCREEN  ABO/RH    Imaging Review Dg Chest 1 View  10/19/2014   CLINICAL DATA:  Intertrochanteric left femoral  neck fracture after a fall today while at the grocery store. Preoperative respiratory evaluation.  EXAM: CHEST  1 VIEW  COMPARISON:  06/20/2014 and earlier.  FINDINGS: Suboptimal inspiration accounts for crowded bronchovascular markings, especially in the bases, and accentuates the cardiac silhouette. Taking this into account, cardiac silhouette normal in size for the AP supine technique. Stable chronic mild elevation of the right hemidiaphragm. Lungs clear. Bronchovascular markings normal. Pulmonary vascularity normal. No visible pleural effusions. No pneumothorax.  IMPRESSION: Suboptimal inspiration.  No acute cardiopulmonary disease.   Electronically Signed   By: Evangeline Dakin M.D.   On: 10/19/2014 15:52   Ct Head Wo Contrast  10/19/2014   CLINICAL DATA:  Witnessed fall today.  EXAM: CT HEAD WITHOUT CONTRAST  TECHNIQUE: Contiguous axial images were obtained from the base of the skull through the vertex without intravenous contrast.  COMPARISON:  06/29/2012  FINDINGS: No acute intracranial abnormality. Specifically, no hemorrhage, hydrocephalus, mass lesion, acute infarction, or significant intracranial injury. No acute calvarial abnormality. Mastoid air cells are clear as are the visualized paranasal sinuses.  IMPRESSION: No intracranial abnormality.   Electronically Signed   By: Rolm Baptise M.D.   On: 10/19/2014 16:02   Dg Hip Unilat With Pelvis 2-3 Views Left  10/19/2014   CLINICAL DATA:  79 year old who fell while at the gross read stool or earlier today and injured the left hip pain. Patient has chronic left hip pain which acutely worsened after the fall.  EXAM: LEFT HIP (WITH PELVIS) 2-3 VIEWS  COMPARISON:  None.  FINDINGS: Intertrochanteric left femoral neck fracture with the fracture line extending into the proximal femoral metaphysis. Hip joint anatomically aligned with severe joint space narrowing. Bone mineral density relatively well preserved for age.  Included AP pelvis demonstrates mild  joint space narrowing in the contralateral right hip. Sacroiliac joints and symphysis pubis intact. No fractures elsewhere. Degenerative changes involving the lower lumbar spine.  IMPRESSION: 1. Acute traumatic intertrochanteric left femoral neck fracture with the fracture line extending into the proximal femoral metaphysis. 2. Severe osteoarthritis involving the left hip.   Electronically Signed   By: Sherran Needs.D.  On: 10/19/2014 15:51     EKG Interpretation   Date/Time:  Saturday Oct 19 2014 18:04:23 EDT Ventricular Rate:  131 PR Interval:    QRS Duration: 97 QT Interval:  310 QTC Calculation: 458 R Axis:   82 Text Interpretation:  Atrial fibrillation with rapid ventricular response  Borderline right axis deviation Borderline repol abnormality, diffuse  leads Since last tracing Atrial fibrillation has recurred Confirmed by  Sabra Heck  MD, Jorja Empie ([id number]) on 10/19/2014 6:08:46 PM      MDM   Final diagnoses:  Trauma  Head injuries, initial encounter  Hip fracture, left, closed, initial encounter   Vital signs show tachycardia on my exam otherwise they are unremarkable, exam is consistent with a possible hip fracture versus exacerbation of chronic pain brought on by contusion of the hip, x-ray pending, the patient had no loss of consciousness no significant headache and no hematoma. He is on Xarelto, we'll obtain CT scan of the head. He felt like his head bounced off the ground.  The pt has afib with RVR - he is already on xarelto - unfortunately that delays surgery as well - pain is well controlled, Cardizem ordered for afib - VS otherwise normal  D/w Dr. Mardelle Matte - requests pt be transferred to [facility] for admission and surgery tomorrow.  D/w Dr. Roel Cluck - she will facilitate transfer.  cardizem given, HR improving.  Meds given in ED:  Medications  fentaNYL (SUBLIMAZE) injection 50 mcg (50 mcg Intravenous Given 10/19/14 1549)  0.9 %  sodium chloride infusion (1,000 mLs  Intravenous New Bag/Given 10/19/14 1554)  diltiazem (CARDIZEM) injection 20 mg (not administered)  ondansetron (ZOFRAN) injection 4 mg (4 mg Intravenous Given 10/19/14 1548)   New Prescriptions   No medications on file    Noemi Chapel, MD 10/19/14 1828  Noemi Chapel, MD 10/19/14 Curly Rim

## 2014-10-19 NOTE — ED Notes (Signed)
Called [facility] for pt transfer and dispatch has advised me that we will looking at at least 3 hours wait. Agricultural consultant and pt updated.

## 2014-10-19 NOTE — H&P (Addendum)
PCP:  Horatio Pel, MD  Cardiology [facility]  Referring provider Sabra Heck   Chief Complaint:  Left hip pain after a fall  HPI: Howard Charles is a 79 y.o. male   has a past medical history of Diabetes mellitus type 2 in obese; Diabetic neuropathy; Hypertension; GERD (gastroesophageal reflux disease); Gout; Left knee DJD; Right knee DJD; Bilateral hearing loss; Chronic renal insufficiency, stage III (moderate); Dysrhythmia; On supplemental oxygen therapy; Ileus, postoperative (10/22/2014); and PONV (postoperative nausea and vomiting).   Presented with fall while at the grocery store, Patient lost balance and fell hitting his HEAD and left hip he was not able to get up. EMS was called and he arrived to ER. Plain imaging showed Intertochanteric fracture of the left hip. Patient with hx of A.fib on Xarelto last dose this AM and coreg. On arrival to ER he was tachycardic with HR up to 130 with A.fib he was given a dose of diltiazem and improved with HR down to 80's currently converted to sinus rhythm with heart rate of 70 Reports hx of DM on Lantus he checks his BG twice a day. Reports good control HG 6.9 He has been seen recently by his cardiologist and had a an echo done was told it was ok. NO records in Epic. Denies any hx of CAD. No hx of chest pain. Patient goes to the gym 2-4 days a week.  Does yard work.  Reports he is on oxygen 2 L at night.  In ER Dr. Mardelle Matte has been consulted will plan to operate in AM  Hospitalist was called for admission for Left hip fracture  Review of Systems:    Pertinent positives include:  Knee and hip pain  Constitutional:  No weight loss, night sweats, Fevers, chills, fatigue, weight loss  HEENT:  No headaches, Difficulty swallowing,Tooth/dental problems,Sore throat,  No sneezing, itching, ear ache, nasal congestion, post nasal drip,  Cardio-vascular:  No chest pain, Orthopnea, PND, anasarca,  dizziness, palpitations.no Bilateral lower extremity swelling  GI:  No heartburn, indigestion, abdominal pain, nausea, vomiting, diarrhea, change in bowel habits, loss of appetite, melena, blood in stool, hematemesis Resp:  no shortness of breath at rest. No dyspnea on exertion, No excess mucus, no productive cough, No non-productive cough, No coughing up of blood.No change in color of mucus.No wheezing. Skin:  no rash or lesions. No jaundice GU:  no dysuria, change in color of urine, no urgency or frequency. No straining to urinate.  No flank pain.  Musculoskeletal:  No joint pain or no joint swelling. No decreased range of motion. No back pain.  Psych:  No change in mood or affect. No depression or anxiety. No memory loss.  Neuro: no localizing neurological complaints, no tingling, no weakness, no double vision, no gait abnormality, no slurred speech, no confusion  Otherwise ROS are negative except for above, 10 systems were reviewed  Past Medical History: Past Medical History  Diagnosis Date  . Diabetes mellitus type 2 in obese   . Diabetic neuropathy   . Hypertension   . GERD (gastroesophageal reflux disease)   . Gout   . Left knee DJD   . Right knee DJD   . Bilateral hearing loss     WEARS HEARING AIDS........RIGHT IS BETTER THEN LEFT  . Chronic renal insufficiency, stage III (moderate)     Creatinine 1.47  . Dysrhythmia     h/o A Fib  . On supplemental oxygen therapy  at night  . Ileus, postoperative 10/22/2014  . PONV (postoperative nausea and vomiting)     H/O ....BUT LAST FEW TIMES---NONE   Past Surgical History  Procedure Laterality Date  . Knee arthroscopy  2008    left   . Knee arthroscopy  2012    left  . Knee arthroscopy  2002    right  . Hemorrhoid surgery    . Inner ear surgery    . Shoulder arthroscopy with rotator cuff repair  2007  . Suprapubic prostatectomy      21 YRS  AGO  . Cholecystectomy    . Tonsillectomy    . Total knee arthroplasty   06/26/2012    Procedure: TOTAL KNEE ARTHROPLASTY;  Surgeon: Lorn Junes, MD;  Location: [facility];  Service: Orthopedics;  Laterality: Left;  . Colonoscopy    . Total knee arthroplasty Right 05/21/2013    Procedure: TOTAL KNEE ARTHROPLASTY;  Surgeon: Lorn Junes, MD;  Location: [facility];  Service: Orthopedics;  Laterality: Right;  . Intramedullary (im) nail intertrochanteric Left 10/20/2014  . Intramedullary (im) nail intertrochanteric Left 10/20/2014    Procedure: INTRAMEDULLARY (IM) NAIL INTERTROCHANTRIC;  Surgeon: [name], MD;  Location: [facility];  Service: Orthopedics;  Laterality: Left;  Marland Kitchen Bowel decompression N/A 10/24/2014    Procedure: BOWEL DECOMPRESSION;  Surgeon: Clarene Essex, MD;  Location: [facility];  Service: Endoscopy;  Laterality: N/A;  . Colonoscopy N/A 10/24/2014    Procedure: COLONOSCOPY;  Surgeon: Clarene Essex, MD;  Location: [facility] ENDOSCOPY;  Service: Endoscopy;  Laterality: N/A;  . Colonoscopy N/A 11/01/2014    Procedure: COLONOSCOPY;  Surgeon: Ronald Lobo, MD;  Location: [facility] ENDOSCOPY;  Service: Endoscopy;  Laterality: N/A;     Medications: Prior to Admission medications   Medication Sig Start Date End Date Taking? Authorizing Provider  allopurinol (ZYLOPRIM) 300 MG tablet Take 300 mg by mouth every morning.    Yes Historical Provider, MD  carvedilol (COREG) 6.25 MG tablet Take 6.25 mg by mouth 2 (two) times daily with a meal.   Yes Historical Provider, MD  insulin glargine (LANTUS) 100 UNIT/ML injection Inject 52 Units into the skin daily before breakfast.    Yes Historical Provider, MD  Multiple Vitamins-Minerals (CENTRUM SILVER ULTRA MENS PO) Take 1 tablet by mouth daily.   Yes Historical Provider, MD  omega-3 acid ethyl esters (LOVAZA) 1 G capsule Take 1 g by mouth every morning.   Yes Historical Provider, MD  pantoprazole (PROTONIX) 40 MG tablet Take 40 mg by mouth every evening.    Yes Historical Provider, MD  rivaroxaban (XARELTO) 20 MG TABS tablet Take 20 mg by  mouth every morning.    Yes Historical Provider, MD  rosuvastatin (CRESTOR) 10 MG tablet Take 10 mg by mouth every evening.    Yes Historical Provider, MD  saxagliptin HCl (ONGLYZA) 5 MG TABS tablet Take 5 mg by mouth every morning.    Yes Historical Provider, MD  telmisartan-hydrochlorothiazide (MICARDIS HCT) 80-25 MG per tablet Take 1 tablet by mouth every morning.    Yes Historical Provider, MD  oxyCODONE (OXY IR/ROXICODONE) 5 MG immediate release tablet 1-2 tablets every 4-6 hrs as needed for pain Patient not taking: Reported on 10/19/2014 05/22/13   Kirstin Shepperson, PA-C    Allergies:  No Known Allergies  Social History:  Ambulatory   independently   Lives at home   With family     reports that he has never smoked. He has never used smokeless tobacco. He reports that  he does not drink alcohol or use illicit drugs.    Family History: family history includes Diabetes in his mother; Diabetes Mellitus II in his father; Heart attack in his father; Heart disease in his father and mother; Hypertension in his father and mother.    Physical Exam: Patient Vitals for the past 24 hrs:  BP Temp Temp src Pulse Resp SpO2  10/19/14 1911 136/84 mmHg - - 99 18 96 %  10/19/14 1505 118/76 mmHg 98.2 F (36.8 C) Oral 86 18 99 %    1. General:  in No Acute distress 2. Psychological: Alert and   Oriented 3. Head/ENT:     Dry Mucous Membranes                          Head Non traumatic, neck supple                          Normal  Dentition 4. SKIN: normal  Skin turgor,  Skin clean Dry and intact no rash 5. Heart: irregular rate and rhythm no Murmur, Rub or gallop 6. Lungs: Clear to auscultation bilaterally, no wheezes or crackles   7. Abdomen: Soft, non-tender, Non distended 8. Lower extremities: no clubbing, cyanosis, or edema 9. Neurologically Grossly intact, moving all 4 extremities equally 10. MSK: Normal range of motion limited by pain. Left hip fracture  body mass index is unknown  because there is no weight on file.   Labs on Admission:   Results for orders placed or performed during the hospital encounter of 10/19/14 (from the past 24 hour(s))  Basic metabolic panel     Status: Abnormal   Collection Time: 10/19/14  3:26 PM  Result Value Ref Range   Sodium 140 135 - 145 mmol/L   Potassium 4.2 3.5 - 5.1 mmol/L   Chloride 106 101 - 111 mmol/L   CO2 22 22 - 32 mmol/L   Glucose, Bld 201 (H) 65 - 99 mg/dL   BUN 30 (H) 6 - 20 mg/dL   Creatinine, Ser 1.52 (H) 0.61 - 1.24 mg/dL   Calcium 9.3 8.9 - 10.3 mg/dL   GFR calc non Af Amer 43 (L) >60 mL/min   GFR calc Af Amer 50 (L) >60 mL/min   Anion gap 12 5 - 15  CBC WITH DIFFERENTIAL     Status: Abnormal   Collection Time: 10/19/14  3:26 PM  Result Value Ref Range   WBC 10.9 (H) 4.0 - 10.5 K/uL   RBC 4.76 4.22 - 5.81 MIL/uL   Hemoglobin 15.2 13.0 - 17.0 g/dL   HCT 44.4 39.0 - 52.0 %   MCV 93.3 78.0 - 100.0 fL   MCH 31.9 26.0 - 34.0 pg   MCHC 34.2 30.0 - 36.0 g/dL   RDW 14.3 11.5 - 15.5 %   Platelets 196 150 - 400 K/uL   Neutrophils Relative % 66 43 - 77 %   Neutro Abs 7.2 1.7 - 7.7 K/uL   Lymphocytes Relative 19 12 - 46 %   Lymphs Abs 2.1 0.7 - 4.0 K/uL   Monocytes Relative 9 3 - 12 %   Monocytes Absolute 1.0 0.1 - 1.0 K/uL   Eosinophils Relative 5 0 - 5 %   Eosinophils Absolute 0.6 0.0 - 0.7 K/uL   Basophils Relative 1 0 - 1 %   Basophils Absolute 0.1 0.0 - 0.1 K/uL  Protime-INR     Status: Abnormal  Collection Time: 10/19/14  3:26 PM  Result Value Ref Range   Prothrombin Time 19.5 (H) 11.6 - 15.2 seconds   INR 1.64 (H) 0.00 - 1.49  ABO/Rh     Status: None   Collection Time: 10/19/14  3:27 PM  Result Value Ref Range   ABO/RH(D) O POS   Type and screen     Status: None   Collection Time: 10/19/14  5:55 PM  Result Value Ref Range   ABO/RH(D) O POS    Antibody Screen NEG    Sample Expiration 10/22/2014     UA Pending  Lab Results  Component Value Date   HGBA1C 7.1* 06/26/2012    Estimated  Creatinine Clearance: 49.4 mL/min (by C-G formula based on Cr of 1.52).  BNP (last 3 results) No results for input(s): PROBNP in the last 8760 hours.  Other results:  I have pearsonaly reviewed this: ECG REPORT  Rate: 131  Rhythm: a.fib w RVR ST&T Change: repolarization abnormality, ST depression minimal in V4-V6 QTC 458  There were no vitals filed for this visit.   Cultures:    Component Value Date/Time   SDES URINE, RANDOM 05/14/2013 1007   SPECREQUEST none 05/14/2013 1007   CULT NO GROWTH Performed at [facility] 05/14/2013 1007   REPTSTATUS 05/14/2013 FINAL 05/14/2013 1007     Radiological Exams on Admission: Dg Chest 1 View  10/19/2014   CLINICAL DATA:  Intertrochanteric left femoral neck fracture after a fall today while at the grocery store. Preoperative respiratory evaluation.  EXAM: CHEST  1 VIEW  COMPARISON:  06/20/2014 and earlier.  FINDINGS: Suboptimal inspiration accounts for crowded bronchovascular markings, especially in the bases, and accentuates the cardiac silhouette. Taking this into account, cardiac silhouette normal in size for the AP supine technique. Stable chronic mild elevation of the right hemidiaphragm. Lungs clear. Bronchovascular markings normal. Pulmonary vascularity normal. No visible pleural effusions. No pneumothorax.  IMPRESSION: Suboptimal inspiration.  No acute cardiopulmonary disease.   Electronically Signed   By: Evangeline Dakin M.D.   On: 10/19/2014 15:52   Ct Head Wo Contrast  10/19/2014   CLINICAL DATA:  Witnessed fall today.  EXAM: CT HEAD WITHOUT CONTRAST  TECHNIQUE: Contiguous axial images were obtained from the base of the skull through the vertex without intravenous contrast.  COMPARISON:  06/29/2012  FINDINGS: No acute intracranial abnormality. Specifically, no hemorrhage, hydrocephalus, mass lesion, acute infarction, or significant intracranial injury. No acute calvarial abnormality. Mastoid air cells are clear as are the  visualized paranasal sinuses.  IMPRESSION: No intracranial abnormality.   Electronically Signed   By: Rolm Baptise M.D.   On: 10/19/2014 16:02   Dg Hip Unilat With Pelvis 2-3 Views Left  10/19/2014   CLINICAL DATA:  79 year old who fell while at the gross read stool or earlier today and injured the left hip pain. Patient has chronic left hip pain which acutely worsened after the fall.  EXAM: LEFT HIP (WITH PELVIS) 2-3 VIEWS  COMPARISON:  None.  FINDINGS: Intertrochanteric left femoral neck fracture with the fracture line extending into the proximal femoral metaphysis. Hip joint anatomically aligned with severe joint space narrowing. Bone mineral density relatively well preserved for age.  Included AP pelvis demonstrates mild joint space narrowing in the contralateral right hip. Sacroiliac joints and symphysis pubis intact. No fractures elsewhere. Degenerative changes involving the lower lumbar spine.  IMPRESSION: 1. Acute traumatic intertrochanteric left femoral neck fracture with the fracture line extending into the proximal femoral metaphysis. 2. Severe osteoarthritis involving  the left hip.   Electronically Signed   By: Evangeline Dakin M.D.   On: 10/19/2014 15:51    Chart has been reviewed  Family not  at  Bedside    Assessment/Plan 79 year old male with history of diabetes, atrial fibrillation chronic renal insufficiency presents with full left hip fracture. Initially was found to have atrial fibrillation with RVR was given a dose of Cardizem and not improved converted to sinus rhythm. Orthopedics is aware, will take patient to OR tomorrow morning  Present on Admission:  . Closed left hip fracture - aspirin orthopedics. Patient is moderate risk from a cardiac standpoint. Reportedly had echogram which was unremarkable in the past few days little unable to obtain his records. Patient is able to complete strenuous tasks without any shortness of breath chest pain. Atrial fibrillation currently well  controlled we'll continue IV metoprolol and substitution full core while nothing by mouth  . Chronic renal insufficiency, stage III (moderate) at baseline continue to monitor currently stable  . A-fib patient is known paroxysmal atrial fibrillation chronic anticoagulation will hold Xarelto, perioperatively currently back to sinus rhythm. Administer IV beta blockers while nothing by mouth  . Hypertension - antihypertensive scan be resumed postoperatively   diabetes mellitus -sensitive sliding scale while nothing by mouth decreased dose of Lantus to 25 units    Prophylaxis: SCD    CODE STATUS:  FULL CODE  as per patient    Disposition:  likely will need placement for rehabilitation    Other plan as per orders.  I have spent a total of 65 min on this admission case was discussed with orthopedics  [place] 10/19/2014, 8:44 PM  [facility]  Pager [phone]   after 2 AM please page floor coverage PA If 7AM-7PM, please contact the day team taking care of the patient  [email or web address]  Password [email or web address]  Addendum prior diagnosis of Alzheimer's disease was erroneously added and has been deleted  Marthena Whitmyer  08/30/2015 9:41 PM

## 2014-10-19 NOTE — ED Notes (Signed)
Bed: WA03 Expected date: 10/19/14 Expected time: 2:48 PM Means of arrival: Ambulance Comments: Fall, Left hip ? Shortening, hit head

## 2014-10-19 NOTE — ED Notes (Signed)
Per EMS-Pt c/o of witnessed fall at [employer] today. Lives home with wife. C/o of left hip pain. Denies LOC or nausea/vomiting. Alert oriented. Possible shortening

## 2014-10-19 NOTE — ED Notes (Signed)
Patient returned from xray.

## 2014-10-19 NOTE — Consult Note (Signed)
ORTHOPAEDIC CONSULTATION  REQUESTING PHYSICIAN: Noemi Chapel, MD  Chief Complaint: left hip pain  HPI: Howard Charles is a 79 y.o. male who complains of  Left hip pain after a mechanical fall today at the grocery store.  Pain is worse with movement, better with iv pain meds.  Can not walk.  He did have pre-existing hip pain, and was worked up, had injections, not clear if his symptoms were coming from his back or his hip. He is a patient of Dr. Antony Contras. Pain is currently rated as moderate to severe. Denies loss of consciousness but he did hit his head. He has noted progressive loss of function in his balance, and his ability to ambulate. He occasionally uses a cane, but does ambulate in public regularly.  Past Medical History  Diagnosis Date  . Diabetes mellitus type 2 in obese   . Diabetic neuropathy   . Hypertension   . GERD (gastroesophageal reflux disease)   . Gout   . Left knee DJD   . Right knee DJD   . PONV (postoperative nausea and vomiting)     H/O ....BUT LAST FEW TIMES---NONE  . Bilateral hearing loss     WEARS HEARING AIDS........RIGHT IS BETTER THEN LEFT  . Chronic renal insufficiency, stage III (moderate)     Creatinine 1.47  . Dysrhythmia     h/o A Fib  . On supplemental oxygen therapy     at night   Past Surgical History  Procedure Laterality Date  . Knee arthroscopy  2008    left   . Knee arthroscopy  2012    left  . Knee arthroscopy  2002    right  . Hemorrhoid surgery    . Inner ear surgery    . Shoulder arthroscopy with rotator cuff repair  2007  . Suprapubic prostatectomy      21 YRS  AGO  . Cholecystectomy    . Tonsillectomy    . Total knee arthroplasty  06/26/2012    Procedure: TOTAL KNEE ARTHROPLASTY;  Surgeon: Lorn Junes, MD;  Location: [facility];  Service: Orthopedics;  Laterality: Left;  . Colonoscopy    . Total knee arthroplasty Right 05/21/2013    Procedure: TOTAL KNEE ARTHROPLASTY;  Surgeon: Lorn Junes, MD;  Location: [facility];  Service: Orthopedics;  Laterality: Right;   History   Social History  . Marital Status: Married    Spouse Name: N/A  . Number of Children: N/A  . Years of Education: N/A   Social History Main Topics  . Smoking status: Never Smoker   . Smokeless tobacco: Never Used  . Alcohol Use: No  . Drug Use: No  . Sexual Activity: Not on file   Other Topics Concern  . None   Social History Narrative   Family History  Problem Relation Age of Onset  . Hypertension Mother   . Diabetes Mother   . Heart disease Mother   . Heart disease Father   . Heart attack Father   . Hypertension Father   . Diabetes Mellitus II Father    No Known Allergies Prior to Admission medications   Medication Sig Start Date End Date Taking? Authorizing Provider  allopurinol (ZYLOPRIM) 300 MG tablet Take 300 mg by mouth every morning.    Yes Historical Provider, MD  carvedilol (COREG) 6.25 MG tablet Take 6.25 mg by mouth 2 (two) times daily with a meal.   Yes Historical Provider, MD  insulin glargine (LANTUS) 100 UNIT/ML  injection Inject 52 Units into the skin daily before breakfast.    Yes Historical Provider, MD  Multiple Vitamins-Minerals (CENTRUM SILVER ULTRA MENS PO) Take 1 tablet by mouth daily.   Yes Historical Provider, MD  omega-3 acid ethyl esters (LOVAZA) 1 G capsule Take 1 g by mouth every morning.   Yes Historical Provider, MD  pantoprazole (PROTONIX) 40 MG tablet Take 40 mg by mouth every evening.    Yes Historical Provider, MD  rivaroxaban (XARELTO) 20 MG TABS tablet Take 20 mg by mouth every morning.    Yes Historical Provider, MD  rosuvastatin (CRESTOR) 10 MG tablet Take 10 mg by mouth every evening.    Yes Historical Provider, MD  saxagliptin HCl (ONGLYZA) 5 MG TABS tablet Take 5 mg by mouth every morning.    Yes Historical Provider, MD  telmisartan-hydrochlorothiazide (MICARDIS HCT) 80-25 MG per tablet Take 1 tablet by mouth every morning.    Yes Historical Provider, MD  oxyCODONE (OXY  IR/ROXICODONE) 5 MG immediate release tablet 1-2 tablets every 4-6 hrs as needed for pain Patient not taking: Reported on 10/19/2014 05/22/13   Matthew Saras, PA-C   Dg Chest 1 View  10/19/2014   CLINICAL DATA:  Intertrochanteric left femoral neck fracture after a fall today while at the grocery store. Preoperative respiratory evaluation.  EXAM: CHEST  1 VIEW  COMPARISON:  06/20/2014 and earlier.  FINDINGS: Suboptimal inspiration accounts for crowded bronchovascular markings, especially in the bases, and accentuates the cardiac silhouette. Taking this into account, cardiac silhouette normal in size for the AP supine technique. Stable chronic mild elevation of the right hemidiaphragm. Lungs clear. Bronchovascular markings normal. Pulmonary vascularity normal. No visible pleural effusions. No pneumothorax.  IMPRESSION: Suboptimal inspiration.  No acute cardiopulmonary disease.   Electronically Signed   By: Evangeline Dakin M.D.   On: 10/19/2014 15:52   Ct Head Wo Contrast  10/19/2014   CLINICAL DATA:  Witnessed fall today.  EXAM: CT HEAD WITHOUT CONTRAST  TECHNIQUE: Contiguous axial images were obtained from the base of the skull through the vertex without intravenous contrast.  COMPARISON:  06/29/2012  FINDINGS: No acute intracranial abnormality. Specifically, no hemorrhage, hydrocephalus, mass lesion, acute infarction, or significant intracranial injury. No acute calvarial abnormality. Mastoid air cells are clear as are the visualized paranasal sinuses.  IMPRESSION: No intracranial abnormality.   Electronically Signed   By: Rolm Baptise M.D.   On: 10/19/2014 16:02   Dg Hip Unilat With Pelvis 2-3 Views Left  10/19/2014   CLINICAL DATA:  79 year old who fell while at the gross read stool or earlier today and injured the left hip pain. Patient has chronic left hip pain which acutely worsened after the fall.  EXAM: LEFT HIP (WITH PELVIS) 2-3 VIEWS  COMPARISON:  None.  FINDINGS: Intertrochanteric left  femoral neck fracture with the fracture line extending into the proximal femoral metaphysis. Hip joint anatomically aligned with severe joint space narrowing. Bone mineral density relatively well preserved for age.  Included AP pelvis demonstrates mild joint space narrowing in the contralateral right hip. Sacroiliac joints and symphysis pubis intact. No fractures elsewhere. Degenerative changes involving the lower lumbar spine.  IMPRESSION: 1. Acute traumatic intertrochanteric left femoral neck fracture with the fracture line extending into the proximal femoral metaphysis. 2. Severe osteoarthritis involving the left hip.   Electronically Signed   By: Evangeline Dakin M.D.   On: 10/19/2014 15:51    Positive ROS: All other systems have been reviewed and were otherwise negative with the exception  of those mentioned in the HPI and as above.  Physical Exam: General: Alert, no acute distress Cardiovascular: No pedal edema Respiratory: No cyanosis, no use of accessory musculature GI: No organomegaly, abdomen is soft and non-tender, he does have a fairly protuberant abdomen. Skin: No lesions in the area of chief complaint Neurologic: Sensation intact distally, despite his report of neuropathy. Psychiatric: Patient is competent for consent with normal mood and affect Lymphatic: No axillary or cervical lymphadenopathy  MUSCULOSKELETAL: Left hip has positive log roll, unable to lift his leg, EHL and FHL are intact. Right lower extremity is atraumatic.  Assessment: Left intertrochanteric hip fracture that extends down into the shaft, with pre-existing hip pain, may be a combination of radicular in etiology versus degenerative hip disease. The articular surfaces of his hip are actually not that bad, although described by the radiologist as severe, the weightbearing portion still have reasonable chondral space.  Plan: This is an acute severe injury and has multiple risk factors when considering surgical  intervention. Nonetheless he is active and I recommended surgical fixation of the left hip. He is currently on Xarelto, and we will give this time to wear off, but will likely need to proceed with urgent surgery tomorrow. I have coordinated with the internal medicine/hospitalist service and appreciate their help. We will plan for nothing by mouth after midnight tonight.  The risks benefits and alternatives were discussed with the patient including but not limited to the risks of nonoperative treatment, versus surgical intervention including infection, bleeding, nerve injury, malunion, nonunion, the need for revision surgery, hardware prominence, hardware failure, the need for hardware removal, blood clots, cardiopulmonary complications, morbidity, mortality, among others, and they were willing to proceed.    We did have a long discussion about the consideration of the pre-existing hip pain and arthritis, and whether or not total hip replacement would be an option, however given the involvement of the greater trochanter, as well as the extension down to the shaft, it would effectively make a fairly high risk, revision type operation for his hip, and I recommended that we get him through the recovery of his hip fracture first with internal fixation, and if in fact his hip proves to be the source of his long-standing problems, rather than his back, then we could remove the rod, and do a hip replacement down the line. This would be a safer, and more functional path, rather then a calcar replacing long stem total hip replacement for what appears to be relatively mild radiographic hip changes.  Johnny Bridge, MD Cell [phone]   10/19/2014 7:29 PM

## 2014-10-20 ENCOUNTER — Inpatient Hospital Stay (HOSPITAL_COMMUNITY): Payer: Medicare Other

## 2014-10-20 ENCOUNTER — Encounter (HOSPITAL_COMMUNITY): Payer: Self-pay | Admitting: Anesthesiology

## 2014-10-20 ENCOUNTER — Encounter (HOSPITAL_COMMUNITY)
Admission: EM | Disposition: A | Discharge status: Skilled Nursing Facility | Payer: Self-pay | Source: Home / Self Care | Attending: Internal Medicine

## 2014-10-20 ENCOUNTER — Inpatient Hospital Stay (HOSPITAL_COMMUNITY): Payer: Medicare Other | Admitting: Anesthesiology

## 2014-10-20 HISTORY — PX: INTRAMEDULLARY (IM) NAIL INTERTROCHANTERIC: SHX5875

## 2014-10-20 LAB — CBG MONITORING, ED: Glucose-Capillary: 173 mg/dL — ABNORMAL HIGH (ref 65–99)

## 2014-10-20 LAB — URINALYSIS, ROUTINE W REFLEX MICROSCOPIC
BILIRUBIN URINE: NEGATIVE
Glucose, UA: NEGATIVE mg/dL
Ketones, ur: NEGATIVE mg/dL
Leukocytes, UA: NEGATIVE
NITRITE: NEGATIVE
PH: 6 (ref 5.0–8.0)
Protein, ur: NEGATIVE mg/dL
Specific Gravity, Urine: 1.013 (ref 1.005–1.030)
Urobilinogen, UA: 0.2 mg/dL (ref 0.0–1.0)

## 2014-10-20 LAB — GLUCOSE, CAPILLARY
Glucose-Capillary: 129 mg/dL — ABNORMAL HIGH (ref 65–99)
Glucose-Capillary: 125 mg/dL — ABNORMAL HIGH (ref 65–99)
Glucose-Capillary: 116 mg/dL — ABNORMAL HIGH (ref 65–99)
Glucose-Capillary: 132 mg/dL — ABNORMAL HIGH (ref 65–99)
Glucose-Capillary: 154 mg/dL — ABNORMAL HIGH (ref 65–99)

## 2014-10-20 LAB — URINE MICROSCOPIC-ADD ON

## 2014-10-20 LAB — MRSA PCR SCREENING: MRSA BY PCR: NEGATIVE

## 2014-10-20 SURGERY — FIXATION, FRACTURE, INTERTROCHANTERIC, WITH INTRAMEDULLARY ROD
Anesthesia: General | Site: Hip | Laterality: Left

## 2014-10-20 MED ORDER — DEXTROSE 5 % IV SOLN
500.0000 mg | Freq: Four times a day (QID) | INTRAVENOUS | Status: DC | PRN
  Filled 2014-10-20: qty 5

## 2014-10-20 MED ORDER — PROPOFOL 10 MG/ML IV BOLUS
INTRAVENOUS | Status: DC | PRN
  Administered 2014-10-20: 100 mg via INTRAVENOUS

## 2014-10-20 MED ORDER — INSULIN GLARGINE 100 UNIT/ML ~~LOC~~ SOLN
40.0000 [IU] | Freq: Every day | SUBCUTANEOUS | Status: DC
  Administered 2014-10-20 – 2014-10-27 (×6): 40 [IU] via SUBCUTANEOUS
  Filled 2014-10-20 (×10): qty 0.4

## 2014-10-20 MED ORDER — BACLOFEN 10 MG PO TABS: 10.0000 mg | ORAL_TABLET | Freq: Three times a day (TID) | ORAL | Status: DC

## 2014-10-20 MED ORDER — EPHEDRINE SULFATE 50 MG/ML IJ SOLN
INTRAMUSCULAR | Status: DC | PRN
Start: 2014-10-20 — End: 2014-10-20
  Administered 2014-10-20: 10 mg via INTRAVENOUS
  Administered 2014-10-20: 5 mg via INTRAVENOUS
  Administered 2014-10-20: 10 mg via INTRAVENOUS

## 2014-10-20 MED ORDER — NEOSTIGMINE METHYLSULFATE 10 MG/10ML IV SOLN
INTRAVENOUS | Status: AC
  Filled 2014-10-20: qty 1

## 2014-10-20 MED ORDER — HYDROCODONE-ACETAMINOPHEN 5-325 MG PO TABS: 1.0000 | ORAL_TABLET | Freq: Four times a day (QID) | ORAL | Status: DC | PRN

## 2014-10-20 MED ORDER — ONDANSETRON HCL 4 MG/2ML IJ SOLN
4.0000 mg | Freq: Four times a day (QID) | INTRAMUSCULAR | Status: DC | PRN
  Administered 2014-10-20 – 2014-11-08 (×7): 4 mg via INTRAVENOUS
  Filled 2014-10-20 (×7): qty 2

## 2014-10-20 MED ORDER — NEOSTIGMINE METHYLSULFATE 10 MG/10ML IV SOLN
INTRAVENOUS | Status: DC | PRN
Start: 2014-10-20 — End: 2014-10-20
  Administered 2014-10-20: 4 mg via INTRAVENOUS

## 2014-10-20 MED ORDER — MAGNESIUM CITRATE PO SOLN: 1.0000 | Freq: Once | ORAL | Status: AC | PRN

## 2014-10-20 MED ORDER — FENTANYL CITRATE (PF) 250 MCG/5ML IJ SOLN
INTRAMUSCULAR | Status: AC
  Filled 2014-10-20: qty 5

## 2014-10-20 MED ORDER — SENNA 8.6 MG PO TABS
1.0000 | ORAL_TABLET | Freq: Two times a day (BID) | ORAL | Status: DC
  Administered 2014-10-20 – 2014-10-22 (×4): 8.6 mg via ORAL
  Filled 2014-10-20 (×8): qty 1

## 2014-10-20 MED ORDER — LIDOCAINE HCL (CARDIAC) 20 MG/ML IV SOLN
INTRAVENOUS | Status: AC
  Filled 2014-10-20: qty 5

## 2014-10-20 MED ORDER — INSULIN ASPART 100 UNIT/ML ~~LOC~~ SOLN
0.0000 [IU] | SUBCUTANEOUS | Status: DC
  Administered 2014-10-20: 2 [IU] via SUBCUTANEOUS
  Administered 2014-10-20 (×2): 1 [IU] via SUBCUTANEOUS
  Administered 2014-10-20: 2 [IU] via SUBCUTANEOUS
  Administered 2014-10-20 – 2014-10-21 (×3): 1 [IU] via SUBCUTANEOUS
  Administered 2014-10-21: 2 [IU] via SUBCUTANEOUS
  Administered 2014-10-22 (×2): 1 [IU] via SUBCUTANEOUS
  Administered 2014-10-22 (×2): 2 [IU] via SUBCUTANEOUS
  Administered 2014-10-22 – 2014-10-23 (×2): 1 [IU] via SUBCUTANEOUS
  Administered 2014-10-23: 2 [IU] via SUBCUTANEOUS
  Administered 2014-10-24 – 2014-10-25 (×2): 1 [IU] via SUBCUTANEOUS
  Filled 2014-10-20: qty 1

## 2014-10-20 MED ORDER — SENNA-DOCUSATE SODIUM 8.6-50 MG PO TABS: 2.0000 | ORAL_TABLET | Freq: Every day | ORAL | Status: DC

## 2014-10-20 MED ORDER — PROMETHAZINE HCL 25 MG/ML IJ SOLN
6.2500 mg | INTRAMUSCULAR | Status: AC | PRN
  Administered 2014-10-20: 6.25 mg via INTRAVENOUS
  Administered 2014-10-20: 12.5 mg via INTRAVENOUS
  Filled 2014-10-20 (×2): qty 1

## 2014-10-20 MED ORDER — BISACODYL 10 MG RE SUPP: 10.0000 mg | Freq: Every day | RECTAL | Status: DC | PRN

## 2014-10-20 MED ORDER — ROCURONIUM BROMIDE 100 MG/10ML IV SOLN
INTRAVENOUS | Status: DC | PRN
  Administered 2014-10-20: 50 mg via INTRAVENOUS

## 2014-10-20 MED ORDER — ONDANSETRON HCL 4 MG/2ML IJ SOLN
INTRAMUSCULAR | Status: AC
  Filled 2014-10-20: qty 2

## 2014-10-20 MED ORDER — PHENOL 1.4 % MT LIQD
1.0000 | OROMUCOSAL | Status: DC | PRN
  Filled 2014-10-20: qty 177

## 2014-10-20 MED ORDER — GLYCOPYRROLATE 0.2 MG/ML IJ SOLN
INTRAMUSCULAR | Status: AC
  Filled 2014-10-20: qty 4

## 2014-10-20 MED ORDER — METOPROLOL TARTRATE 1 MG/ML IV SOLN
5.0000 mg | Freq: Once | INTRAVENOUS | Status: AC
  Administered 2014-10-20: 5 mg via INTRAVENOUS
  Filled 2014-10-20: qty 5

## 2014-10-20 MED ORDER — FENTANYL CITRATE (PF) 100 MCG/2ML IJ SOLN
INTRAMUSCULAR | Status: DC | PRN
  Administered 2014-10-20: 250 ug via INTRAVENOUS

## 2014-10-20 MED ORDER — CEFAZOLIN SODIUM-DEXTROSE 2-3 GM-% IV SOLR
INTRAVENOUS | Status: DC | PRN
  Administered 2014-10-20: 2 g via INTRAVENOUS

## 2014-10-20 MED ORDER — INSULIN GLARGINE 100 UNIT/ML ~~LOC~~ SOLN
25.0000 [IU] | Freq: Every day | SUBCUTANEOUS | Status: DC
  Administered 2014-10-20: 25 [IU] via SUBCUTANEOUS
  Filled 2014-10-20 (×2): qty 0.25

## 2014-10-20 MED ORDER — PHENYLEPHRINE HCL 10 MG/ML IJ SOLN
INTRAMUSCULAR | Status: DC | PRN
Start: 2014-10-20 — End: 2014-10-20
  Administered 2014-10-20: 80 ug via INTRAVENOUS
  Administered 2014-10-20 (×2): 120 ug via INTRAVENOUS
  Administered 2014-10-20: 80 ug via INTRAVENOUS

## 2014-10-20 MED ORDER — 0.9 % SODIUM CHLORIDE (POUR BTL) OPTIME
TOPICAL | Status: DC | PRN
  Administered 2014-10-20: 1000 mL

## 2014-10-20 MED ORDER — ONDANSETRON HCL 4 MG PO TABS
4.0000 mg | ORAL_TABLET | Freq: Four times a day (QID) | ORAL | Status: DC | PRN
  Administered 2014-10-22: 4 mg via ORAL
  Filled 2014-10-20: qty 1

## 2014-10-20 MED ORDER — ACETAMINOPHEN 650 MG RE SUPP: 650.0000 mg | Freq: Four times a day (QID) | RECTAL | Status: DC | PRN

## 2014-10-20 MED ORDER — DOCUSATE SODIUM 100 MG PO CAPS
100.0000 mg | ORAL_CAPSULE | Freq: Two times a day (BID) | ORAL | Status: DC
  Administered 2014-10-20 – 2014-10-22 (×5): 100 mg via ORAL
  Filled 2014-10-20 (×8): qty 1

## 2014-10-20 MED ORDER — SUCCINYLCHOLINE CHLORIDE 20 MG/ML IJ SOLN
INTRAMUSCULAR | Status: AC
  Filled 2014-10-20: qty 1

## 2014-10-20 MED ORDER — PROMETHAZINE HCL 25 MG/ML IJ SOLN
12.5000 mg | Freq: Four times a day (QID) | INTRAMUSCULAR | Status: DC | PRN
  Administered 2014-10-21 – 2014-11-08 (×8): 12.5 mg via INTRAVENOUS
  Filled 2014-10-20 (×7): qty 1

## 2014-10-20 MED ORDER — CARVEDILOL 6.25 MG PO TABS
6.2500 mg | ORAL_TABLET | Freq: Two times a day (BID) | ORAL | Status: DC
  Administered 2014-10-20: 6.25 mg via ORAL
  Filled 2014-10-20: qty 1
  Filled 2014-10-20: qty 2
  Filled 2014-10-20: qty 1

## 2014-10-20 MED ORDER — HYDROCODONE-ACETAMINOPHEN 10-325 MG PO TABS: 1.0000 | ORAL_TABLET | Freq: Four times a day (QID) | ORAL | Status: DC | PRN

## 2014-10-20 MED ORDER — ONDANSETRON HCL 4 MG/2ML IJ SOLN
INTRAMUSCULAR | Status: DC | PRN
  Administered 2014-10-20: 4 mg via INTRAVENOUS

## 2014-10-20 MED ORDER — HYDROMORPHONE HCL 1 MG/ML IJ SOLN
INTRAMUSCULAR | Status: AC
  Filled 2014-10-20: qty 1

## 2014-10-20 MED ORDER — EPHEDRINE SULFATE 50 MG/ML IJ SOLN
INTRAMUSCULAR | Status: AC
  Filled 2014-10-20: qty 1

## 2014-10-20 MED ORDER — RIVAROXABAN 20 MG PO TABS: 20.0000 mg | ORAL_TABLET | Freq: Every morning | ORAL | Status: DC

## 2014-10-20 MED ORDER — ROCURONIUM BROMIDE 50 MG/5ML IV SOLN
INTRAVENOUS | Status: AC
  Filled 2014-10-20: qty 1

## 2014-10-20 MED ORDER — ACETAMINOPHEN 325 MG PO TABS
650.0000 mg | ORAL_TABLET | Freq: Four times a day (QID) | ORAL | Status: DC | PRN
  Administered 2014-11-04 – 2014-11-06 (×4): 650 mg via ORAL
  Filled 2014-10-20 (×4): qty 2

## 2014-10-20 MED ORDER — SODIUM CHLORIDE 0.9 % IV SOLN
1000.0000 mL | INTRAVENOUS | Status: DC
Start: 2014-10-20 — End: 2014-10-20
  Administered 2014-10-20: 1000 mL via INTRAVENOUS

## 2014-10-20 MED ORDER — MORPHINE SULFATE 2 MG/ML IJ SOLN
0.5000 mg | INTRAMUSCULAR | Status: DC | PRN
  Administered 2014-10-20 (×3): 0.5 mg via INTRAVENOUS
  Filled 2014-10-20 (×3): qty 1

## 2014-10-20 MED ORDER — ALLOPURINOL 300 MG PO TABS
300.0000 mg | ORAL_TABLET | Freq: Every morning | ORAL | Status: DC
  Administered 2014-10-21 – 2014-11-12 (×16): 300 mg via ORAL
  Filled 2014-10-20 (×24): qty 1

## 2014-10-20 MED ORDER — METOPROLOL TARTRATE 1 MG/ML IV SOLN
2.5000 mg | Freq: Four times a day (QID) | INTRAVENOUS | Status: DC
  Administered 2014-10-20: 2.5 mg via INTRAVENOUS
  Filled 2014-10-20 (×2): qty 5

## 2014-10-20 MED ORDER — ROSUVASTATIN CALCIUM 10 MG PO TABS
10.0000 mg | ORAL_TABLET | Freq: Every evening | ORAL | Status: DC
  Administered 2014-10-21 – 2014-11-11 (×15): 10 mg via ORAL
  Filled 2014-10-20 (×24): qty 1

## 2014-10-20 MED ORDER — MIDAZOLAM HCL 2 MG/2ML IJ SOLN: 0.5000 mg | Freq: Once | INTRAMUSCULAR | Status: AC | PRN

## 2014-10-20 MED ORDER — SODIUM CHLORIDE 0.9 % IV SOLN
INTRAVENOUS | Status: DC
  Administered 2014-10-20: 12:00:00 via INTRAVENOUS
  Administered 2014-10-21: 75 mL/h via INTRAVENOUS
  Administered 2014-10-22: 13:00:00 via INTRAVENOUS
  Administered 2014-10-22: 75 mL/h via INTRAVENOUS
  Administered 2014-10-23: 03:00:00 via INTRAVENOUS

## 2014-10-20 MED ORDER — GLYCOPYRROLATE 0.2 MG/ML IJ SOLN
INTRAMUSCULAR | Status: DC | PRN
  Administered 2014-10-20: .8 mg via INTRAVENOUS

## 2014-10-20 MED ORDER — CEFAZOLIN SODIUM-DEXTROSE 2-3 GM-% IV SOLR
2.0000 g | Freq: Four times a day (QID) | INTRAVENOUS | Status: AC
  Administered 2014-10-20 (×2): 2 g via INTRAVENOUS
  Filled 2014-10-20 (×2): qty 50

## 2014-10-20 MED ORDER — LACTATED RINGERS IV SOLN
INTRAVENOUS | Status: DC | PRN
  Administered 2014-10-20: 07:00:00 via INTRAVENOUS

## 2014-10-20 MED ORDER — ALBUMIN HUMAN 5 % IV SOLN
INTRAVENOUS | Status: DC | PRN
  Administered 2014-10-20: 08:00:00 via INTRAVENOUS

## 2014-10-20 MED ORDER — HYDROMORPHONE HCL 1 MG/ML IJ SOLN
0.2500 mg | INTRAMUSCULAR | Status: DC | PRN
  Administered 2014-10-20 (×2): 0.5 mg via INTRAVENOUS

## 2014-10-20 MED ORDER — HYDROCODONE-ACETAMINOPHEN 5-325 MG PO TABS
1.0000 | ORAL_TABLET | Freq: Four times a day (QID) | ORAL | Status: DC | PRN
  Administered 2014-10-21 – 2014-10-22 (×2): 2 via ORAL
  Administered 2014-10-22: 1 via ORAL
  Filled 2014-10-20: qty 2
  Filled 2014-10-20: qty 1
  Filled 2014-10-20 (×2): qty 2
  Filled 2014-10-20: qty 1

## 2014-10-20 MED ORDER — ALUM & MAG HYDROXIDE-SIMETH 200-200-20 MG/5ML PO SUSP
30.0000 mL | ORAL | Status: DC | PRN
Start: 2014-10-20 — End: 2014-11-12
  Administered 2014-10-20 – 2014-10-21 (×2): 30 mL via ORAL
  Filled 2014-10-20 (×2): qty 30

## 2014-10-20 MED ORDER — CARVEDILOL 12.5 MG PO TABS
12.5000 mg | ORAL_TABLET | Freq: Two times a day (BID) | ORAL | Status: DC
  Administered 2014-10-20: 12.5 mg via ORAL
  Filled 2014-10-20: qty 1

## 2014-10-20 MED ORDER — METHOCARBAMOL 500 MG PO TABS: 500.0000 mg | ORAL_TABLET | Freq: Four times a day (QID) | ORAL | Status: DC | PRN

## 2014-10-20 MED ORDER — FLEET ENEMA 7-19 GM/118ML RE ENEM: 1.0000 | ENEMA | Freq: Once | RECTAL | Status: DC | PRN

## 2014-10-20 MED ORDER — LIDOCAINE HCL (CARDIAC) 20 MG/ML IV SOLN
INTRAVENOUS | Status: DC | PRN
  Administered 2014-10-20: 25 mg via INTRAVENOUS

## 2014-10-20 MED ORDER — PROPOFOL 10 MG/ML IV BOLUS
INTRAVENOUS | Status: AC
  Filled 2014-10-20: qty 20

## 2014-10-20 MED ORDER — POLYETHYLENE GLYCOL 3350 17 G PO PACK
17.0000 g | PACK | Freq: Every day | ORAL | Status: DC | PRN
Start: 2014-10-20 — End: 2014-10-23
  Administered 2014-10-21: 17 g via ORAL
  Filled 2014-10-20 (×2): qty 1

## 2014-10-20 MED ORDER — FERROUS SULFATE 325 (65 FE) MG PO TABS
325.0000 mg | ORAL_TABLET | Freq: Three times a day (TID) | ORAL | Status: DC
  Administered 2014-10-21 – 2014-10-22 (×4): 325 mg via ORAL
  Filled 2014-10-20 (×9): qty 1

## 2014-10-20 MED ORDER — MEPERIDINE HCL 25 MG/ML IJ SOLN
6.2500 mg | INTRAMUSCULAR | Status: DC | PRN
  Filled 2014-10-20: qty 1

## 2014-10-20 MED ORDER — SENNOSIDES-DOCUSATE SODIUM 8.6-50 MG PO TABS: 1.0000 | ORAL_TABLET | Freq: Every evening | ORAL | Status: DC | PRN

## 2014-10-20 MED ORDER — MORPHINE SULFATE 2 MG/ML IJ SOLN
2.0000 mg | INTRAMUSCULAR | Status: DC | PRN
  Administered 2014-10-20: 2 mg via INTRAVENOUS
  Filled 2014-10-20 (×2): qty 1

## 2014-10-20 MED ORDER — MENTHOL 3 MG MT LOZG
1.0000 | LOZENGE | OROMUCOSAL | Status: DC | PRN
Start: 2014-10-20 — End: 2014-11-12

## 2014-10-20 SURGICAL SUPPLY — 46 items
APL SKNCLS STERI-STRIP NONHPOA (GAUZE/BANDAGES/DRESSINGS)
BENZOIN TINCTURE PRP APPL 2/3 (GAUZE/BANDAGES/DRESSINGS) ×1 IMPLANT
BIT DRILL CANN 16 (DRILL) ×2 IMPLANT
BLADE TFNA HELICAL 100 STRL (Anchor) ×2 IMPLANT
BOOTCOVER CLEANROOM LRG (PROTECTIVE WEAR) ×2 IMPLANT
CLOSURE STERI-STRIP 1/2X4 (GAUZE/BANDAGES/DRESSINGS) ×1
CLOSURE WOUND 1/2 X4 (GAUZE/BANDAGES/DRESSINGS) ×1
CLSR STERI-STRIP ANTIMIC 1/2X4 (GAUZE/BANDAGES/DRESSINGS) ×2 IMPLANT
COVER PERINEAL POST (MISCELLANEOUS) ×3 IMPLANT
COVER SURGICAL LIGHT HANDLE (MISCELLANEOUS) ×3 IMPLANT
DRAPE STERI IOBAN 125X83 (DRAPES) ×3 IMPLANT
DRSG MEPILEX BORDER 4X4 (GAUZE/BANDAGES/DRESSINGS) ×6 IMPLANT
DRSG MEPILEX BORDER 4X8 (GAUZE/BANDAGES/DRESSINGS) IMPLANT
DURAPREP 26ML APPLICATOR (WOUND CARE) ×3 IMPLANT
ELECT CAUTERY BLADE 6.4 (BLADE) ×3 IMPLANT
ELECT REM PT RETURN 9FT ADLT (ELECTROSURGICAL) ×3
ELECTRODE REM PT RTRN 9FT ADLT (ELECTROSURGICAL) ×1 IMPLANT
EVACUATOR 1/8 PVC DRAIN (DRAIN) IMPLANT
FACESHIELD WRAPAROUND (MASK) IMPLANT
FACESHIELD WRAPAROUND OR TEAM (MASK) ×2 IMPLANT
GAUZE XEROFORM 5X9 LF (GAUZE/BANDAGES/DRESSINGS) ×1 IMPLANT
GLOVE BIOGEL PI ORTHO PRO SZ8 (GLOVE) ×4
GLOVE ORTHO TXT STRL SZ7.5 (GLOVE) ×6 IMPLANT
GLOVE PI ORTHO PRO STRL SZ8 (GLOVE) ×2 IMPLANT
GLOVE SURG ORTHO 8.0 STRL STRW (GLOVE) ×6 IMPLANT
GOWN STRL REUS W/ TWL LRG LVL3 (GOWN DISPOSABLE) IMPLANT
GOWN STRL REUS W/TWL LRG LVL3 (GOWN DISPOSABLE) ×3
GUIDEWIRE 3.2X400 (WIRE) ×2 IMPLANT
KIT ROOM TURNOVER OR (KITS) ×3 IMPLANT
LINER BOOT UNIVERSAL DISP (MISCELLANEOUS) ×1 IMPLANT
MANIFOLD NEPTUNE II (INSTRUMENTS) ×3 IMPLANT
NAIL TROCH FIX LNG 11X400LT (Nail) ×2 IMPLANT
NS IRRIG 1000ML POUR BTL (IV SOLUTION) ×3 IMPLANT
PACK GENERAL/GYN (CUSTOM PROCEDURE TRAY) ×3 IMPLANT
PAD ARMBOARD 7.5X6 YLW CONV (MISCELLANEOUS) ×10 IMPLANT
STRIP CLOSURE SKIN 1/2X4 (GAUZE/BANDAGES/DRESSINGS) ×1 IMPLANT
SUT VIC AB 0 CTB1 27 (SUTURE) ×3 IMPLANT
SUT VIC AB 1 CT1 27 (SUTURE) ×3
SUT VIC AB 1 CT1 27XBRD ANBCTR (SUTURE) IMPLANT
SUT VIC AB 2-0 CT1 27 (SUTURE) ×3
SUT VIC AB 2-0 CT1 TAPERPNT 27 (SUTURE) IMPLANT
SUT VIC AB 2-0 FS1 27 (SUTURE) ×2 IMPLANT
SUT VIC AB 3-0 SH 8-18 (SUTURE) ×3 IMPLANT
TOWEL OR 17X24 6PK STRL BLUE (TOWEL DISPOSABLE) ×3 IMPLANT
TOWEL OR 17X26 10 PK STRL BLUE (TOWEL DISPOSABLE) ×3 IMPLANT
WATER STERILE IRR 1000ML POUR (IV SOLUTION) ×3 IMPLANT

## 2014-10-20 NOTE — Progress Notes (Signed)
Orthopedic Tech Progress Note Patient Details:  Howard Charles [id number] [id number] Pt. unable to use OHF with trapeze.  Patient ID: [name], male   DOB: October 30, 1935, 79 y.o.   MRN: [id number]   [name] 10/20/2014, 8:59 PM

## 2014-10-20 NOTE — Progress Notes (Signed)
[facility] hospitalist progress note. Chief complaint. Transfer note. History of present illness. This 79 year old male presented to [facility] long emergency room after suffering a mechanical fall. Complained of left hip pain. Radiology indicated a closed left hip fracture. Orthopedic surgery was contacted and they requested patient be transferred to [facility] for surgical fixation. Patient has arrived in transfer and I'm seeing him at bedside to ensure he remained clinically stable post transfer and that all orders transferred here appropriately. Patient has no concerns or concerns currently. He indicates his pain management is adequate. Physical exam. Vital signs. Temperature 99.6, pulse 142, respiration 18, blood pressure 143/79. O2 sats 94%. General appearance. Well-developed elderly male who is alert and in no distress. Cardiac. Occasional irregular beat and mildly tachycardic. Lungs. Breath sounds clear. Abdomen. Soft with positive bowel sounds. Extremities. Pulses intact bilaterally. Impression/plan. Problem #1. Closed left hip fracture. Patient will be followed by orthopedics and surgery planned for later this a.m. #2 chronic renal insufficiency. Hydrate and monitor. Problem #3. A. fib. Anticoagulation on hold. We'll administer IV beta blocker while nothing by mouth. Problem #4. Hypertension. Home medications can be restarted postop. Problem #5. Diabetes. Sliding scale coverage while nothing by mouth. Patient appears clinically stable post transfer. All orders transferred appropriately.

## 2014-10-20 NOTE — Anesthesia Preprocedure Evaluation (Addendum)
Anesthesia Evaluation  Patient identified by MRN, date of birth, ID band Patient awake    Reviewed: Allergy & Precautions, H&P , NPO status , Patient's Chart, lab work & pertinent test results, reviewed documented beta blocker date and time   History of Anesthesia Complications (+) PONV and history of anesthetic complications  Airway Mallampati: II  TM Distance: >3 FB Neck ROM: full    Dental  (+) Teeth Intact, Dental Advidsory Given   Pulmonary shortness of breath (nocturnal O2), with exertion and Long-Term Oxygen Therapy,  breath sounds clear to auscultation        Cardiovascular hypertension, Pt. on medications and Pt. on home beta blockers - angina+ dysrhythmias Atrial Fibrillation Rhythm:Irregular Rate:Tachycardia  Pt reports ECHO within the month unremarkable   Neuro/Psych negative neurological ROS     GI/Hepatic Neg liver ROS, GERD-  Medicated and Controlled,  Endo/Other  diabetes (glu 125), Type 2, Insulin DependentMorbid obesity  Renal/GU Renal InsufficiencyRenal disease (creat 1.52)     Musculoskeletal  (+) Arthritis -, Osteoarthritis,    Abdominal (+) + obese,   Peds  Hematology  (+) Blood dyscrasia (INR 1.64), ,   Anesthesia Other Findings A-flutter rate of 140 at times this morning. Converted to NSR with Carvedilol  Reproductive/Obstetrics                           Anesthesia Physical Anesthesia Plan  ASA: III  Anesthesia Plan: General   Post-op Pain Management:    Induction: Intravenous  Airway Management Planned: Oral ETT  Additional Equipment:   Intra-op Plan:   Post-operative Plan: Extubation in OR  Informed Consent: I have reviewed the patients History and Physical, chart, labs and discussed the procedure including the risks, benefits and alternatives for the proposed anesthesia with the patient or authorized representative who has indicated his/her understanding  and acceptance.   Dental Advisory Given and Dental advisory given  Plan Discussed with: Anesthesiologist, CRNA and Surgeon  Anesthesia Plan Comments: (Plan routine monitors, GETA)       Anesthesia Quick Evaluation

## 2014-10-20 NOTE — Anesthesia Procedure Notes (Signed)
Procedure Name: Intubation Date/Time: 10/20/2014 7:57 AM Performed by: Neldon Newport Pre-anesthesia Checklist: Patient being monitored, Suction available, Emergency Drugs available, Patient identified and Timeout performed Patient Re-evaluated:Patient Re-evaluated prior to inductionOxygen Delivery Method: Circle system utilized Preoxygenation: Pre-oxygenation with 100% oxygen Intubation Type: IV induction Ventilation: Mask ventilation without difficulty Laryngoscope Size: Mac and 3 Grade View: Grade I Tube type: Oral Tube size: 7.5 mm Number of attempts: 1 Placement Confirmation: breath sounds checked- equal and bilateral,  positive ETCO2 and ETT inserted through vocal cords under direct vision Secured at: 23 cm Tube secured with: Tape Dental Injury: Teeth and Oropharynx as per pre-operative assessment

## 2014-10-20 NOTE — Op Note (Signed)
DATE OF SURGERY:  10/20/2014  TIME: 9:09 AM  PATIENT NAME:  Howard Charles  AGE: 79 y.o.  PRE-OPERATIVE DIAGNOSIS:  left intertrochanteric hip fracture  POST-OPERATIVE DIAGNOSIS:  SAME  PROCEDURE:  INTRAMEDULLARY (IM) NAIL INTERTROCHANTRIC  SURGEON:  Macyn Remmert P  ASSISTANT:  Joya Gaskins, OPA-C, present and scrubbed throughout the case, critical for assistance with exposure, retraction, instrumentation, and closure.  OPERATIVE IMPLANTS: Synthes 11x400 trochanteric femoral nail with 235TI interlocking helical blade into the femoral head.  Operative specimens: I sent reamings from the greater trochanter for pathology given his history of prostate cancer.  PREOPERATIVE INDICATIONS:  [name] is a 79 y.o. year old who fell and suffered a hip fracture. He was brought into the ER and then admitted and optimized and then elected for surgical intervention.    The risks benefits and alternatives were discussed with the patient including but not limited to the risks of nonoperative treatment, versus surgical intervention including infection, bleeding, nerve injury, malunion, nonunion, hardware prominence, hardware failure, need for hardware removal, blood clots, cardiopulmonary complications, morbidity, mortality, among others, and they were willing to proceed.    OPERATIVE PROCEDURE:  The patient was brought to the operating room and placed in the supine position. General anesthesia was administered, with a foley. He was placed on the fracture table.  Closed reduction was performed under C-arm guidance. The length of the femur was also measured using fluoroscopy. Time out was then performed after sterile prep and drape. He received preoperative antibiotics.  Incision was made proximal to the greater trochanter. A guidewire was placed in the appropriate position. Confirmation was made on AP and lateral views. The above-named nail was opened. I opened the proximal femur with a reamer.  I sent reamings to pathology. I then placed the nail by hand easily down. I did not need to ream the femur.  Once the nail was completely seated, I placed a guidepin into the femoral head into the center center position. I measured the length, and then reamed the lateral cortex and up into the head. I then placed the helical blade. Slight compression was applied. Anatomic fixation achieved. Bone quality was reasonably good.  I then secured the proximal interlocking bolt, and took off a half a turn, and then removed the instruments, and took final C-arm pictures AP and lateral the entire length of the leg. Anatomic reconstruction was achieved, and the wounds were irrigated copiously and closed with Vicryl followed by Steri-Strips and sterile gauze for the skin. The patient was awakened and returned to PACU in stable and satisfactory condition. There no complications and the patient tolerated the procedure well.  He will be weightbearing as tolerated, and will be on xarelto for DVT prophylaxis as well as atrial fibrillation anticoagulation per his routine regimen.   [name], M.D.

## 2014-10-20 NOTE — Progress Notes (Signed)
Paged Dr. Cruzita Lederer: Updated on cardiac monitor rhythms - episodes of A Fib with SV Tach @ 130-140 with both regular and irregular rates. During these episodes he remains alert and talking with visitors and family - often laughing and telling stories. Additionally he has experienced periods of nausea and dry heaves (in that he has only had sips of Ginger Ale). Given Zofran IV @ 2111; Phenergan 6.25 IV push in 10 cc NS and Mylanta 30 cc PO @ 1720. This RN was unable to give Coreg 6.25 mg @ 1700 d/t N/V. Orders received. At 1800, he was able to take Coreg 12.5 mg PO. Currently [facility] is elevated 60 degrees and he is tolerating warm chicken noodle soup.

## 2014-10-20 NOTE — Progress Notes (Signed)
The patient has been re-examined, and the chart reviewed, and there have been no interval changes to the documented history and physical.    The risks benefits and alternatives were discussed with the patient including but not limited to the risks of nonoperative treatment, versus surgical intervention including infection, bleeding, nerve injury, malunion, nonunion, the need for revision surgery, hardware prominence, hardware failure, the need for hardware removal, blood clots, cardiopulmonary complications, morbidity, mortality, among others, and they were willing to proceed.    Johnny Bridge, MD

## 2014-10-20 NOTE — Transfer of Care (Signed)
Immediate Anesthesia Transfer of Care Note  Patient: Howard Charles  Procedure(s) Performed: Procedure(s): INTRAMEDULLARY (IM) NAIL INTERTROCHANTRIC (Left)  Patient Location: PACU  Anesthesia Type:General  Level of Consciousness: awake, alert  and oriented  Airway & Oxygen Therapy: Patient Spontanous Breathing and Patient connected to face mask oxygen  Post-op Assessment: Report given to RN, Post -op Vital signs reviewed and stable, Patient moving all extremities and Patient moving all extremities X 4  Post vital signs: Reviewed and stable  Last Vitals:  Filed Vitals:   10/20/14 0618  BP: 119/70  Pulse: 72  Temp: 36.8 C  Resp: 16    Complications: No apparent anesthesia complications

## 2014-10-20 NOTE — Progress Notes (Addendum)
PROGRESS NOTE  Howard Charles JSE:[id number] DOB: 02-Dec-1935 DOA: 10/19/2014 PCP: Horatio Pel, MD  HPI: 79 y.o. Male with history of Diabetes mellitus type 2 in obese with neuropathy, Hypertension, GERD, Gout, Left knee DJD, Right knee DJD, CKD III, A fib, presented with fall while at the grocery store, Patient lost balance and fell hitting his head and left hip he was not able to get up. EMS was called and he arrived to ER. Plain imaging showed Intertochanteric fracture of the left hip. Patient with hx of A.fib on Xarelto last dose this AM and coreg. On arrival to ER he was tachycardic with HR up to 130 with A.fib he was given a dose of diltiazem and improved with HR down to 80's currently converted to sinus rhythm with heart rate of 70 Reports hx of DM on Lantus he checks his BG twice a day. Reports good control HG 6.9 He has been seen recently by his cardiologist and had a an echo done was told it was ok. NO records in Epic. Denies any hx of CAD. No hx of chest pain. Patient goes to the gym 2-4 days a week. Does yard work. Reports he is on oxygen 2 L at night. In ER Dr. Mardelle Matte has been consulted will plan to operate in AM  Subjective / 24 H Interval events - patient seen post op, doing well - reports hip pain - no chest pain, shortness of breath, no abdominal pain, nausea or vomiting.   Assessment/Plan: Active Problems:   Alzheimer's disease   Diabetes mellitus type 2 in obese   Hypertension   Chronic renal insufficiency, stage III (moderate)   A-fib   Prostate cancer   Closed left hip fracture  Closed left hip fracture  - orthopedic surgery consulted, Dr. Mardelle Matte perfomed repair with intertrochanteric IM nail on 5/22. - will resume Xarelto when OK by orthopedics  Alzheimer's disease  - mild patient is alert and oriented but may have some decompensation  CKD stage III - baseline Cr ~1.5, currently at baseline - avoid nephrotoxic medications  A-fib  - was in A fib  with RVR in the ED, converted to sinus, now back to A fib, HR 100-110s - currently on Coreg, continue  - start telemetry  - anticoagulated with Xarelto, to be resumed per ortho  Addendum: Telemetry reviewed ~6 pm, patient still in A fib, HR stable 100-110 at rest with 130s with activity. Patient asymptomatic. Increased his Coreg to 12.5 mg from 6.25. Discussed with night coverage to review telemetry in about 2 hours.   Hypertension  - hold Micardis for now as he is in A fib to allow increasing Coreg if needed for rate control  Diabetes mellitus  - start Lantus to 40U instead of 52 at home, SSI - A1C 7.1 in 2014, repeat pending   Diet: Diet heart healthy/carb modified Room service appropriate?: Yes; Fluid consistency:: Thin Fluids: none  DVT Prophylaxis: Xarelto  Code Status: Full Code Family Communication: d/w 3 family members bedside  Disposition Plan: remain inpatient  Consultants:  Orthopedic surgery   Procedures:  IM nail 5/20   Antibiotics  Anti-infectives    None       Studies  Dg Chest 1 View  10/19/2014   CLINICAL DATA:  Intertrochanteric left femoral neck fracture after a fall today while at the grocery store. Preoperative respiratory evaluation.  EXAM: CHEST  1 VIEW  COMPARISON:  06/20/2014 and earlier.  FINDINGS: Suboptimal inspiration accounts for crowded bronchovascular markings,  especially in the bases, and accentuates the cardiac silhouette. Taking this into account, cardiac silhouette normal in size for the AP supine technique. Stable chronic mild elevation of the right hemidiaphragm. Lungs clear. Bronchovascular markings normal. Pulmonary vascularity normal. No visible pleural effusions. No pneumothorax.  IMPRESSION: Suboptimal inspiration.  No acute cardiopulmonary disease.   Electronically Signed   By: Evangeline Dakin M.D.   On: 10/19/2014 15:52   Ct Head Wo Contrast  10/19/2014   CLINICAL DATA:  Witnessed fall today.  EXAM: CT HEAD WITHOUT CONTRAST   TECHNIQUE: Contiguous axial images were obtained from the base of the skull through the vertex without intravenous contrast.  COMPARISON:  06/29/2012  FINDINGS: No acute intracranial abnormality. Specifically, no hemorrhage, hydrocephalus, mass lesion, acute infarction, or significant intracranial injury. No acute calvarial abnormality. Mastoid air cells are clear as are the visualized paranasal sinuses.  IMPRESSION: No intracranial abnormality.   Electronically Signed   By: Rolm Baptise M.D.   On: 10/19/2014 16:02   Dg C-arm 1-60 Min  10/20/2014   CLINICAL DATA:  Left intertrochanteric fracture.  EXAM: DG C-ARM 61-120 MIN; LEFT FEMUR 2 VIEWS  TECHNIQUE: C-arm images of the left femur were obtained in the operating room.  FLUOROSCOPY TIME:  Radiation Exposure Index (as provided by the fluoroscopic device): Not applicable  If the device does not provide the exposure index:  Fluoroscopy Time (in minutes and seconds):  0 min 49 seconds  Number of Acquired Images:  For  COMPARISON:  Left hip radiographs dated 10/19/2014.  FINDINGS: Intra medullary rod fixation of the previously demonstrated left intertrochanteric fracture with anatomic position and alignment of the fracture fragments. Left knee prosthesis. No new fractures or dislocations. Lower pelvic surgical clips.  IMPRESSION: Hardware fixation of the previously demonstrated left intertrochanteric fracture with anatomic position and alignment.   Electronically Signed   By: Claudie Revering M.D.   On: 10/20/2014 10:31   Dg Hip Port Unilat With Pelvis 1v Left  10/20/2014   CLINICAL DATA:  Immediately postop ORIF of an intertrochanteric left femoral neck fracture.  EXAM: LEFT HIP (WITH PELVIS) 1 VIEW PORTABLE  COMPARISON:  Left hip x-rays with AP pelvis yesterday.  FINDINGS: Examination was performed portably. ORIF of the intertrochanteric left femoral neck fracture with placement of an intramedullary nail and compression screw. Anatomic alignment. No acute  complicating features. Prior left total knee arthroplasty.  IMPRESSION: Anatomic alignment post ORIF of the intertrochanteric left femoral neck fracture without acute complicating features.   Electronically Signed   By: Evangeline Dakin M.D.   On: 10/20/2014 10:33   Dg Hip Unilat With Pelvis 2-3 Views Left  10/19/2014   CLINICAL DATA:  79 year old who fell while at the gross read stool or earlier today and injured the left hip pain. Patient has chronic left hip pain which acutely worsened after the fall.  EXAM: LEFT HIP (WITH PELVIS) 2-3 VIEWS  COMPARISON:  None.  FINDINGS: Intertrochanteric left femoral neck fracture with the fracture line extending into the proximal femoral metaphysis. Hip joint anatomically aligned with severe joint space narrowing. Bone mineral density relatively well preserved for age.  Included AP pelvis demonstrates mild joint space narrowing in the contralateral right hip. Sacroiliac joints and symphysis pubis intact. No fractures elsewhere. Degenerative changes involving the lower lumbar spine.  IMPRESSION: 1. Acute traumatic intertrochanteric left femoral neck fracture with the fracture line extending into the proximal femoral metaphysis. 2. Severe osteoarthritis involving the left hip.   Electronically Signed   By: Marcello Moores  Lawrence M.D.   On: 10/19/2014 15:51   Dg Femur Min 2 Views Left  10/20/2014   CLINICAL DATA:  Left intertrochanteric fracture.  EXAM: DG C-ARM 61-120 MIN; LEFT FEMUR 2 VIEWS  TECHNIQUE: C-arm images of the left femur were obtained in the operating room.  FLUOROSCOPY TIME:  Radiation Exposure Index (as provided by the fluoroscopic device): Not applicable  If the device does not provide the exposure index:  Fluoroscopy Time (in minutes and seconds):  0 min 49 seconds  Number of Acquired Images:  For  COMPARISON:  Left hip radiographs dated 10/19/2014.  FINDINGS: Intra medullary rod fixation of the previously demonstrated left intertrochanteric fracture with  anatomic position and alignment of the fracture fragments. Left knee prosthesis. No new fractures or dislocations. Lower pelvic surgical clips.  IMPRESSION: Hardware fixation of the previously demonstrated left intertrochanteric fracture with anatomic position and alignment.   Electronically Signed   By: Claudie Revering M.D.   On: 10/20/2014 10:31    Objective  Filed Vitals:   10/20/14 1015 10/20/14 1030 10/20/14 1042 10/20/14 1055  BP: 128/78 139/89  119/74  Pulse: 76 70 80 78  Temp:  97.9 F (36.6 C)  97.7 F (36.5 C)  TempSrc:    Oral  Resp: 19 17  18   SpO2: 97% 94%  96%    Intake/Output Summary (Last 24 hours) at 10/20/14 1103 Last data filed at 10/20/14 1101  Gross per 24 hour  Intake   1750 ml  Output    925 ml  Net    825 ml   There were no vitals filed for this visit.  Exam:  General:  NAD  HEENT: no scleral icterus, PERRL  Cardiovascular: irregularly irregular, no MRG, good pulses, no edema  Respiratory: CTA biL, good air movement, no wheezing, no crackles, no rales  Abdomen: soft, non tender, BS +, no guarding  MSK/Extremities: no clubbing/cyanosis, no joint swelling  Skin: no rashes  Neuro: non focal, limited exam on hip repair side  Data Reviewed: Basic Metabolic Panel:  Recent Labs Lab 10/19/14 1526  NA 140  K 4.2  CL 106  CO2 22  GLUCOSE 201*  BUN 30*  CREATININE 1.52*  CALCIUM 9.3   CBC:  Recent Labs Lab 10/19/14 1526  WBC 10.9*  NEUTROABS 7.2  HGB 15.2  HCT 44.4  MCV 93.3  PLT 196    CBG:  Recent Labs Lab 10/20/14 0131 10/20/14 0301 10/20/14 0614  GLUCAP 173* 129* 125*    Recent Results (from the past 240 hour(s))  MRSA PCR Screening     Status: None   Collection Time: 10/20/14  4:20 AM  Result Value Ref Range Status   MRSA by PCR NEGATIVE NEGATIVE Final    Comment:        The GeneXpert MRSA Assay (FDA approved for NASAL specimens only), is one component of a comprehensive MRSA colonization surveillance program.  It is not intended to diagnose MRSA infection nor to guide or monitor treatment for MRSA infections.      Scheduled Meds: . [START ON 10/21/2014] allopurinol  300 mg Oral q morning - 10a  . carvedilol  6.25 mg Oral BID WC  . HYDROmorphone      . insulin aspart  0-9 Units Subcutaneous 6 times per day  . insulin glargine  25 Units Subcutaneous QHS  . metoprolol  2.5 mg Intravenous 4 times per day  . rosuvastatin  10 mg Oral QPM   Continuous Infusions: . sodium chloride  1,000 mL (10/20/14 0203)  . sodium chloride      Marzetta Board, MD [facility] Pager [phone]. If 7 PM - 7 AM, please contact night-coverage at [email or web address], password [email or web address] 10/20/2014, 11:03 AM  LOS: 1 day

## 2014-10-20 NOTE — Progress Notes (Signed)
Orthopedic Tech Progress Note Patient Details:  JOENATHAN SAKUMA 06/16/1935 [id number] Patient's medical history contraindicative for safe OHF use Patient ID: RAZIEL KOENIGS, male   DOB: 10/10/1935, 79 y.o.   MRN: [id number]   Fenton Foy 10/20/2014, 7:12 AM

## 2014-10-20 NOTE — Discharge Instructions (Signed)
Diet: As you were doing prior to hospitalization  ° °Shower:  May shower but keep the wounds dry, use an occlusive plastic wrap, NO SOAKING IN TUB.  If the bandage gets wet, change with a clean dry gauze. ° °Dressing:  You may change your dressing 3-5 days after surgery.  Then change the dressing daily with sterile gauze dressing.   ° °There are sticky tapes (steri-strips) on your wounds and all the stitches are absorbable.  Leave the steri-strips in place when changing your dressings, they will peel off with time, usually 2-3 weeks. ° °Activity:  Increase activity slowly as tolerated, but follow the weight bearing instructions below.  No lifting or driving for 6 weeks. ° °Weight Bearing:   As tolerated.   ° °To prevent constipation: you may use a stool softener such as - ° °Colace (over the counter) 100 mg by mouth twice a day  °Drink plenty of fluids (prune juice may be helpful) and high fiber foods °Miralax (over the counter) for constipation as needed.   ° °Itching:  If you experience itching with your medications, try taking only a single pain pill, or even half a pain pill at a time.  You may take up to 10 pain pills per day, and you can also use benadryl over the counter for itching or also to help with sleep.  ° °Precautions:  If you experience chest pain or shortness of breath - call 911 immediately for transfer to the hospital emergency department!! ° °If you develop a fever greater that 101 F, purulent drainage from wound, increased redness or drainage from wound, or calf pain -- Call the office at [phone]                                                °Follow- Up Appointment:  Please call for an appointment to be seen in 2 weeks [place] - [phone] ° ° ° ° ° °

## 2014-10-20 NOTE — Anesthesia Postprocedure Evaluation (Signed)
  Anesthesia Post-op Note  Patient: Howard Charles  Procedure(s) Performed: Procedure(s): INTRAMEDULLARY (IM) NAIL INTERTROCHANTRIC (Left)  Patient Location: PACU  Anesthesia Type:General  Level of Consciousness: awake, alert , oriented and patient cooperative  Airway and Oxygen Therapy: Patient Spontanous Breathing and Patient connected to nasal cannula oxygen  Post-op Pain: mild  Post-op Assessment: Post-op Vital signs reviewed, Patient's Cardiovascular Status Stable, Respiratory Function Stable, Patent Airway, No signs of Nausea or vomiting and Pain level controlled  Post-op Vital Signs: Reviewed and stable  Last Vitals:  Filed Vitals:   10/20/14 1000  BP: 128/65  Pulse: 77  Temp:   Resp: 20    Complications: No apparent anesthesia complications

## 2014-10-20 NOTE — Progress Notes (Signed)
Phone call to Central Telemetry regarding recent pages and 3 episodes SV Tach, rate 140 to 165 lasting 1 - 2 minutes from 1830 to 1847. Paged Dr. Cruzita Lederer to update. He questioned what his activity was during episodes. This RN reported would not know specific activity.Currently remains alert and oriented, that he is in bed. Stated his pager will be off at [phone]. Instructed to call who is on call if SV Loralee Pacas is sustained.

## 2014-10-21 ENCOUNTER — Inpatient Hospital Stay (HOSPITAL_COMMUNITY): Payer: Medicare Other

## 2014-10-21 DIAGNOSIS — I4891 Unspecified atrial fibrillation: Secondary | ICD-10-CM

## 2014-10-21 LAB — GLUCOSE, CAPILLARY
GLUCOSE-CAPILLARY: 105 mg/dL — AB (ref 65–99)
GLUCOSE-CAPILLARY: 124 mg/dL — AB (ref 65–99)
GLUCOSE-CAPILLARY: 175 mg/dL — AB (ref 65–99)
Glucose-Capillary: 132 mg/dL — ABNORMAL HIGH (ref 65–99)
Glucose-Capillary: 147 mg/dL — ABNORMAL HIGH (ref 65–99)
Glucose-Capillary: 201 mg/dL — ABNORMAL HIGH (ref 65–99)
Glucose-Capillary: 99 mg/dL (ref 65–99)

## 2014-10-21 LAB — BASIC METABOLIC PANEL
ANION GAP: 9 (ref 5–15)
BUN: 18 mg/dL (ref 6–20)
CALCIUM: 8.6 mg/dL — AB (ref 8.9–10.3)
CO2: 24 mmol/L (ref 22–32)
CREATININE: 1.55 mg/dL — AB (ref 0.61–1.24)
Chloride: 104 mmol/L (ref 101–111)
GFR calc Af Amer: 48 mL/min — ABNORMAL LOW (ref >60)
GFR calc non Af Amer: 42 mL/min — ABNORMAL LOW (ref >60)
GLUCOSE: 104 mg/dL — AB (ref 65–99)
Potassium: 4.2 mmol/L (ref 3.5–5.1)
Sodium: 137 mmol/L (ref 135–145)

## 2014-10-21 LAB — CBC
HEMATOCRIT: 39.5 % (ref 39.0–52.0)
HEMOGLOBIN: 13.3 g/dL (ref 13.0–17.0)
MCH: 31.6 pg (ref 26.0–34.0)
MCHC: 33.7 g/dL (ref 30.0–36.0)
MCV: 93.8 fL (ref 78.0–100.0)
Platelets: 160 10*3/uL (ref 150–400)
RBC: 4.21 MIL/uL — ABNORMAL LOW (ref 4.22–5.81)
RDW: 14.6 % (ref 11.5–15.5)
WBC: 13.4 10*3/uL — ABNORMAL HIGH (ref 4.0–10.5)

## 2014-10-21 LAB — URINE CULTURE
COLONY COUNT: NO GROWTH
CULTURE: NO GROWTH

## 2014-10-21 MED ORDER — METOPROLOL TARTRATE 1 MG/ML IV SOLN
2.5000 mg | Freq: Once | INTRAVENOUS | Status: AC
  Administered 2014-10-21: 2.5 mg via INTRAVENOUS
  Filled 2014-10-21: qty 5

## 2014-10-21 MED ORDER — METOPROLOL TARTRATE 1 MG/ML IV SOLN
5.0000 mg | Freq: Once | INTRAVENOUS | Status: AC
  Administered 2014-10-21: 5 mg via INTRAVENOUS
  Filled 2014-10-21: qty 5

## 2014-10-21 MED ORDER — SORBITOL 70 % SOLN
960.0000 mL | TOPICAL_OIL | ORAL | Status: AC | PRN
  Administered 2014-10-23: 960 mL via RECTAL
  Filled 2014-10-21 (×2): qty 240

## 2014-10-21 MED ORDER — GLYCERIN (LAXATIVE) 2.1 G RE SUPP
1.0000 | Freq: Once | RECTAL | Status: AC
  Administered 2014-10-21: 1 via RECTAL
  Filled 2014-10-21: qty 1

## 2014-10-21 MED ORDER — CARVEDILOL 12.5 MG PO TABS
12.5000 mg | ORAL_TABLET | Freq: Two times a day (BID) | ORAL | Status: DC
  Administered 2014-10-21 – 2014-10-23 (×4): 12.5 mg via ORAL
  Filled 2014-10-21 (×6): qty 1

## 2014-10-21 MED ORDER — DILTIAZEM HCL ER COATED BEADS 120 MG PO CP24
120.0000 mg | ORAL_CAPSULE | Freq: Two times a day (BID) | ORAL | Status: DC
  Administered 2014-10-21 – 2014-10-23 (×4): 120 mg via ORAL
  Filled 2014-10-21 (×7): qty 1

## 2014-10-21 MED ORDER — CARVEDILOL 25 MG PO TABS
25.0000 mg | ORAL_TABLET | Freq: Two times a day (BID) | ORAL | Status: DC
  Administered 2014-10-21: 25 mg via ORAL
  Filled 2014-10-21: qty 1

## 2014-10-21 MED ORDER — RIVAROXABAN 20 MG PO TABS
20.0000 mg | ORAL_TABLET | Freq: Every day | ORAL | Status: DC
  Administered 2014-10-21 – 2014-10-22 (×2): 20 mg via ORAL
  Filled 2014-10-21 (×3): qty 1

## 2014-10-21 MED ORDER — ZOLPIDEM TARTRATE 5 MG PO TABS
5.0000 mg | ORAL_TABLET | Freq: Once | ORAL | Status: AC
  Administered 2014-10-21: 5 mg via ORAL
  Filled 2014-10-21: qty 1

## 2014-10-21 MED FILL — Hydromorphone HCl Inj 2 MG/ML: INTRAMUSCULAR | Qty: 1 | Status: AC

## 2014-10-21 NOTE — Progress Notes (Signed)
CBG 175; RN notified 

## 2014-10-21 NOTE — Evaluation (Signed)
Physical Therapy Evaluation Patient Details Name: Howard Charles MRN: [id number] DOB: Jun 19, 1935 Today's Date: 10/21/2014   History of Present Illness  Pt is a 79 y/o M s/p fall and L hip fx.  Pt's PMH includes DM, diabetic neuropathy, gout, HTN, B TKA, B hearing loss, chronic renal insufficiency, on supplemental O2 at night, a fib, rotator cuff arthroscopy.  Clinical Impression  Pt admitted with above diagnosis. Pt currently with functional limitations due to the deficits listed below (see PT Problem List). Limited to exercises supine and bed and sitting EOB as pt's HR reached 168 bpm when sitting EOB.  Pt was returned to supine and HR returned to 95-101 bpm within 3 minutes.  Pt eager to get OOB and ambulate but agrees that SNF is the safest option for him upon d/c. Pt will benefit from skilled PT to increase their independence and safety with mobility to allow discharge to the venue listed below.       Follow Up Recommendations SNF;Supervision for mobility/OOB    Equipment Recommendations   (TBD by next venue of care)    Recommendations for Other Services       Precautions / Restrictions Precautions Precautions: Fall Restrictions Weight Bearing Restrictions: Yes LLE Weight Bearing: Weight bearing as tolerated      Mobility  Bed Mobility Overal bed mobility: Needs Assistance Bed Mobility: Supine to Sit;Sit to Supine     Supine to sit: Max assist;HOB elevated Sit to supine: Mod assist   General bed mobility comments: Assist w/ managing BLEs and using bed pad to bring hips to sitting EOB.  Pt w/ heavy use of bed rails and required max verbal cues during supine<>sit.    Transfers                 General transfer comment: did not attempt transfer this session 2/2 pt's HR reaching 168bpm sitting EOB.  Ambulation/Gait                Stairs            Wheelchair Mobility    Modified Rankin (Stroke Patients Only)       Balance Overall balance  assessment: Needs assistance;History of Falls Sitting-balance support: Feet unsupported;Bilateral upper extremity supported Sitting balance-Leahy Scale: Zero                                       Pertinent Vitals/Pain Pain Assessment: 0-10 Pain Score: 10-Worst pain ever Pain Location: LLE w/ mobility Pain Descriptors / Indicators: Guarding;Grimacing;Moaning;Stabbing Pain Intervention(s): Limited activity within patient's tolerance;Monitored during session;Repositioned;Utilized relaxation techniques    Home Living Family/patient expects to be discharged to:: Skilled nursing facility Living Arrangements: Spouse/significant other Available Help at Discharge: Family;Available PRN/intermittently (wife) Type of Home: House Home Access: Stairs to enter Entrance Stairs-Rails: None Entrance Stairs-Number of Steps: 1 Home Layout: Two level;Able to live on main level with bedroom/bathroom Home Equipment: Cane - single point Additional Comments: Also a flight to get down to the basement w/ railing on L side going down.  Has workshop, computer, laundry, freezer.  Pt does not have need to go downstairs upon initial return home.    Prior Function Level of Independence: Independent with assistive device(s)         Comments: cane when L hip was bothering him     Hand Dominance   Dominant Hand: Right    Extremity/Trunk Assessment  Lower Extremity Assessment: LLE deficits/detail   LLE Deficits / Details: weakness and limited ROM s/p L hip IM nail     Communication   Communication: No difficulties  Cognition Arousal/Alertness: Awake/alert Behavior During Therapy: WFL for tasks assessed/performed Overall Cognitive Status: History of cognitive impairments - at baseline                      General Comments General comments (skin integrity, edema, etc.): Educated pt on relaxation technique which were utilized throughout session.  Pt's HR  reached 168bpm upon sitting EOB and pt returned to supine position when it quickly returned to 101 and after 3 minutes supine 95 bpm.     Exercises Total Joint Exercises Ankle Circles/Pumps: AROM;Both;15 reps;Supine Heel Slides: AAROM;Limitations;10 reps;Left;Supine Heel Slides Limitations: muscle guarding 2/2 severe L hip pain Hip ABduction/ADduction: AAROM;Left;10 reps;Supine;Limitations Hip Abduction/Adduction Limitations: muscle guarding 2/2 severe L hip pain      Assessment/Plan    PT Assessment Patient needs continued PT services  PT Diagnosis Difficulty walking;Abnormality of gait;Acute pain;Generalized weakness   PT Problem List Decreased strength;Decreased range of motion;Decreased activity tolerance;Decreased balance;Decreased mobility;Decreased coordination;Decreased cognition;Decreased knowledge of use of DME;Decreased safety awareness;Decreased knowledge of precautions;Cardiopulmonary status limiting activity;Impaired sensation;Obesity;Decreased skin integrity;Pain  PT Treatment Interventions DME instruction;Gait training;Stair training;Functional mobility training;Therapeutic activities;Therapeutic exercise;Balance training;Neuromuscular re-education;Cognitive remediation;Wheelchair mobility training;Patient/family education;Modalities   PT Goals (Current goals can be found in the Care Plan section) Acute Rehab PT Goals Patient Stated Goal: to have a BM PT Goal Formulation: With patient Time For Goal Achievement: 10/28/14 Potential to Achieve Goals: Good    Frequency Min 3X/week   Barriers to discharge Inaccessible home environment;Decreased caregiver support Intermittent assist at home; full flight of stairs to get to bedroom/bathroom    Co-evaluation               End of Session Equipment Utilized During Treatment: Oxygen (3 LPM [place]) Activity Tolerance: Treatment limited secondary to medical complications (Comment);Patient limited by pain (limited 2/2 elevated  HR) Patient left: in bed;with call bell/phone within reach Nurse Communication: Mobility status;Precautions;Weight bearing status (pt's raise in HR)         Time: 7341-9379 PT Time Calculation (min) (ACUTE ONLY): 29 min   Charges:   PT Evaluation $Initial PT Evaluation Tier I: 1 Procedure PT Treatments $Therapeutic Exercise: 8-22 mins   PT G Codes:       Joslyn Hy PT, DPT [phone] Pager: [phone] 10/21/2014, 11:44 AM

## 2014-10-21 NOTE — Progress Notes (Addendum)
PROGRESS NOTE  Howard Charles:[id number] DOB: [birth year] DOA: 10/19/2014 PCP: Horatio Pel, MD  HPI: 79 y.o. Male with history of Diabetes mellitus type 2 in obese with neuropathy, Hypertension, GERD, Gout, Left knee DJD, Right knee DJD, CKD III, A fib, presented with fall while at the grocery store, Patient lost balance and fell hitting his head and left hip he was not able to get up. EMS was called and he arrived to ER. Plain imaging showed Intertochanteric fracture of the left hip. Patient with hx of A.fib on Xarelto last dose this AM and coreg. On arrival to ER he was tachycardic with HR up to 130 with A.fib he was given a dose of diltiazem and improved with HR down to 80's currently converted to sinus rhythm with heart rate of 70 Reports hx of DM on Lantus he checks his BG twice a day. Reports good control HG 6.9 He has been seen recently by his cardiologist and had a an echo done was told it was ok. NO records in Epic. Denies any hx of CAD. No hx of chest pain. Patient goes to the gym 2-4 days a week. Does yard work. Reports he is on oxygen 2 L at night. In ER Dr. Mardelle Matte has been consulted will plan to operate in AM  Subjective / 24 H Interval events - intermittently switching from sinus to A fib, rates elevated at times - no chest pain, shortness of breath, no abdominal pain, nausea or vomiting.   Assessment/Plan: Active Problems:   Alzheimer's disease   Diabetes mellitus type 2 in obese   Hypertension   Chronic renal insufficiency, stage III (moderate)   A-fib   Prostate cancer   Closed left hip fracture  Closed left hip fracture  - orthopedic surgery consulted, Dr. Mardelle Matte perfomed repair with intertrochanteric IM nail on 5/22. - Xarelto restarted today, discussed with Dr. Mardelle Matte  Probable post op ileus - patient with nausea and progressive abdominal distention today, abdominal XR with possible ileus - back his diet to liquids - bowel regimen  A-fib with RVR -  in and out of A fib, increased Coreg to 12.5 last night and 25 this morning, currently in sinus - anticoagulated with Xarelto - patient alternates between sinus and A fib with RVR with rates 150-160s. Asymptomatic. I have discussed with Dr. Einar Gip, he will consult. - will transfer to cardiac telemetry unit. Patient and family updated in the afternoon.   Alzheimer's disease  - very subtle, patient is alert and oriented to place time situation, good insight  CKD stage III - baseline Cr ~1.5, currently at baseline - avoid nephrotoxic medications  Hypertension  - hold Micardis for now  Diabetes mellitus  - start Lantus to 40U instead of 52 at home, SSI - A1C 7.1 in 2014, repeat pending   Diet: Diet clear liquid Room service appropriate?: Yes; Fluid consistency:: Thin Fluids: none  DVT Prophylaxis: Xarelto  Code Status: Full Code Family Communication: d/w patient Disposition Plan: remain inpatient  Consultants:  Orthopedic surgery   Procedures:  IM nail 5/20   Antibiotics  Anti-infectives    Start     Dose/Rate Route Frequency Ordered Stop   10/20/14 1230  ceFAZolin (ANCEF) IVPB 2 g/50 mL premix     2 g 100 mL/hr over 30 Minutes Intravenous Every 6 hours 10/20/14 1114 10/20/14 1912       Studies  Dg Chest 1 View  10/19/2014   CLINICAL DATA:  Intertrochanteric left femoral neck fracture  after a fall today while at the grocery store. Preoperative respiratory evaluation.  EXAM: CHEST  1 VIEW  COMPARISON:  06/20/2014 and earlier.  FINDINGS: Suboptimal inspiration accounts for crowded bronchovascular markings, especially in the bases, and accentuates the cardiac silhouette. Taking this into account, cardiac silhouette normal in size for the AP supine technique. Stable chronic mild elevation of the right hemidiaphragm. Lungs clear. Bronchovascular markings normal. Pulmonary vascularity normal. No visible pleural effusions. No pneumothorax.  IMPRESSION: Suboptimal inspiration.   No acute cardiopulmonary disease.   Electronically Signed   By: Evangeline Dakin M.D.   On: 10/19/2014 15:52   Ct Head Wo Contrast  10/19/2014   CLINICAL DATA:  Witnessed fall today.  EXAM: CT HEAD WITHOUT CONTRAST  TECHNIQUE: Contiguous axial images were obtained from the base of the skull through the vertex without intravenous contrast.  COMPARISON:  06/29/2012  FINDINGS: No acute intracranial abnormality. Specifically, no hemorrhage, hydrocephalus, mass lesion, acute infarction, or significant intracranial injury. No acute calvarial abnormality. Mastoid air cells are clear as are the visualized paranasal sinuses.  IMPRESSION: No intracranial abnormality.   Electronically Signed   By: Rolm Baptise M.D.   On: 10/19/2014 16:02   Dg Abd Portable 1v  10/21/2014   CLINICAL DATA:  Nausea vomiting following hip surgery  EXAM: PORTABLE ABDOMEN - 1 VIEW  COMPARISON:  None.  FINDINGS: Colon is distended with gas although not significantly dilated. Correlation with physical exam is recommended. This may represent a postoperative colonic ileus. Postsurgical changes are noted in the proximal left hip consistent with the patient's given clinical history. Findings of prostatectomy and cholecystectomy are noted as well.  IMPRESSION: Gaseous distension of the colon without true obstructive change. This may represent a postoperative ileus.   Electronically Signed   By: Inez Catalina M.D.   On: 10/21/2014 09:01   Dg C-arm 1-60 Min  10/20/2014   CLINICAL DATA:  Left intertrochanteric fracture.  EXAM: DG C-ARM 61-120 MIN; LEFT FEMUR 2 VIEWS  TECHNIQUE: C-arm images of the left femur were obtained in the operating room.  FLUOROSCOPY TIME:  Radiation Exposure Index (as provided by the fluoroscopic device): Not applicable  If the device does not provide the exposure index:  Fluoroscopy Time (in minutes and seconds):  0 min 49 seconds  Number of Acquired Images:  For  COMPARISON:  Left hip radiographs dated 10/19/2014.  FINDINGS:  Intra medullary rod fixation of the previously demonstrated left intertrochanteric fracture with anatomic position and alignment of the fracture fragments. Left knee prosthesis. No new fractures or dislocations. Lower pelvic surgical clips.  IMPRESSION: Hardware fixation of the previously demonstrated left intertrochanteric fracture with anatomic position and alignment.   Electronically Signed   By: Claudie Revering M.D.   On: 10/20/2014 10:31   Dg Hip Port Unilat With Pelvis 1v Left  10/20/2014   CLINICAL DATA:  Immediately postop ORIF of an intertrochanteric left femoral neck fracture.  EXAM: LEFT HIP (WITH PELVIS) 1 VIEW PORTABLE  COMPARISON:  Left hip x-rays with AP pelvis yesterday.  FINDINGS: Examination was performed portably. ORIF of the intertrochanteric left femoral neck fracture with placement of an intramedullary nail and compression screw. Anatomic alignment. No acute complicating features. Prior left total knee arthroplasty.  IMPRESSION: Anatomic alignment post ORIF of the intertrochanteric left femoral neck fracture without acute complicating features.   Electronically Signed   By: Evangeline Dakin M.D.   On: 10/20/2014 10:33   Dg Hip Unilat With Pelvis 2-3 Views Left  10/19/2014  CLINICAL DATA:  79 year old who fell while at the gross read stool or earlier today and injured the left hip pain. Patient has chronic left hip pain which acutely worsened after the fall.  EXAM: LEFT HIP (WITH PELVIS) 2-3 VIEWS  COMPARISON:  None.  FINDINGS: Intertrochanteric left femoral neck fracture with the fracture line extending into the proximal femoral metaphysis. Hip joint anatomically aligned with severe joint space narrowing. Bone mineral density relatively well preserved for age.  Included AP pelvis demonstrates mild joint space narrowing in the contralateral right hip. Sacroiliac joints and symphysis pubis intact. No fractures elsewhere. Degenerative changes involving the lower lumbar spine.  IMPRESSION: 1.  Acute traumatic intertrochanteric left femoral neck fracture with the fracture line extending into the proximal femoral metaphysis. 2. Severe osteoarthritis involving the left hip.   Electronically Signed   By: Evangeline Dakin M.D.   On: 10/19/2014 15:51   Dg Femur Min 2 Views Left  10/20/2014   CLINICAL DATA:  Left intertrochanteric fracture.  EXAM: DG C-ARM 61-120 MIN; LEFT FEMUR 2 VIEWS  TECHNIQUE: C-arm images of the left femur were obtained in the operating room.  FLUOROSCOPY TIME:  Radiation Exposure Index (as provided by the fluoroscopic device): Not applicable  If the device does not provide the exposure index:  Fluoroscopy Time (in minutes and seconds):  0 min 49 seconds  Number of Acquired Images:  For  COMPARISON:  Left hip radiographs dated 10/19/2014.  FINDINGS: Intra medullary rod fixation of the previously demonstrated left intertrochanteric fracture with anatomic position and alignment of the fracture fragments. Left knee prosthesis. No new fractures or dislocations. Lower pelvic surgical clips.  IMPRESSION: Hardware fixation of the previously demonstrated left intertrochanteric fracture with anatomic position and alignment.   Electronically Signed   By: Claudie Revering M.D.   On: 10/20/2014 10:31    Objective  Filed Vitals:   10/20/14 1945 10/21/14 0010 10/21/14 0430 10/21/14 1100  BP: 123/78 116/74 133/87   Pulse: 91 83 87 168  Temp: 99.5 F (37.5 C) 98.7 F (37.1 C) 98.9 F (37.2 C)   TempSrc: Oral Oral Oral   Resp: 16 16 16    SpO2: 96% 94% 95% 92%    Intake/Output Summary (Last 24 hours) at 10/21/14 1333 Last data filed at 10/21/14 0557  Gross per 24 hour  Intake    120 ml  Output    975 ml  Net   -855 ml   There were no vitals filed for this visit.  Exam:  General:  NAD  HEENT: no scleral icterus, PERRL  Cardiovascular: irregularly irregular, no MRG, good pulses, no edema  Respiratory: CTA biL, good air movement, no wheezing, no crackles, no rales  Abdomen:  soft, mild distention, non tender, decreased BS, no guarding  MSK/Extremities: no clubbing/cyanosis, no joint swelling  Skin: no rashes  Neuro: non focal, limited exam on hip repair side  Data Reviewed: Basic Metabolic Panel:  Recent Labs Lab 10/19/14 1526 10/21/14 0415  NA 140 137  K 4.2 4.2  CL 106 104  CO2 22 24  GLUCOSE 201* 104*  BUN 30* 18  CREATININE 1.52* 1.55*  CALCIUM 9.3 8.6*   CBC:  Recent Labs Lab 10/19/14 1526 10/21/14 0415  WBC 10.9* 13.4*  NEUTROABS 7.2  --   HGB 15.2 13.3  HCT 44.4 39.5  MCV 93.3 93.8  PLT 196 160    CBG:  Recent Labs Lab 10/20/14 1945 10/21/14 0013 10/21/14 0428 10/21/14 0818 10/21/14 1204  GLUCAP 154* 132*  99 124* 147*    Recent Results (from the past 240 hour(s))  Urine culture     Status: None   Collection Time: 10/20/14  1:34 AM  Result Value Ref Range Status   Specimen Description URINE, CATHETERIZED  Final   Special Requests NONE  Final   Colony Count NO GROWTH Performed at [facility]   Final   Culture NO GROWTH Performed at [facility]   Final   Report Status 10/21/2014 FINAL  Final  MRSA PCR Screening     Status: None   Collection Time: 10/20/14  4:20 AM  Result Value Ref Range Status   MRSA by PCR NEGATIVE NEGATIVE Final    Comment:        The GeneXpert MRSA Assay (FDA approved for NASAL specimens only), is one component of a comprehensive MRSA colonization surveillance program. It is not intended to diagnose MRSA infection nor to guide or monitor treatment for MRSA infections.      Scheduled Meds: . allopurinol  300 mg Oral q morning - 10a  . carvedilol  25 mg Oral BID WC  . docusate sodium  100 mg Oral BID  . ferrous sulfate  325 mg Oral TID PC  . Glycerin (Adult)  1 suppository Rectal Once  . insulin aspart  0-9 Units Subcutaneous 6 times per day  . insulin glargine  40 Units Subcutaneous QHS  . rosuvastatin  10 mg Oral QPM  . senna  1 tablet Oral BID    Continuous Infusions: . sodium chloride 75 mL/hr (10/21/14 0322)   Time spent: 35 minutes in patient evaluation, telemetry review, coordinating care with RN, cardiology  Marzetta Board, MD [facility] Pager [phone]. If 7 PM - 7 AM, please contact night-coverage at [email or web address], password [email or web address] 10/21/2014, 1:33 PM  LOS: 2 days

## 2014-10-21 NOTE — Progress Notes (Signed)
OT Cancellation Note  Patient Details Name: Howard Charles MRN: [id number] DOB: 31-Aug-1935   Cancelled Treatment:    Reason Eval/Treat Not Completed: Patient not medically ready. Pt with A-fib with HRs 100's to 150's. RN about to give him meds for this and per her report Cardiology is suppose to consult. Will re-attempt eval tomorrow as appropriate.  Isiaah, Cuervo [phone] 10/21/2014, 3:01 PM

## 2014-10-21 NOTE — Progress Notes (Signed)
Pharmacy Consult - Xarelto  To begin Xarelto for Afib CrCl> 30 ml / min  Plan: Xarelto 20 mg po daily Pharmacy to sign off.  Thank you. Anette Guarneri, PharmD

## 2014-10-21 NOTE — Progress Notes (Addendum)
Patient ID: Howard Charles, male   DOB: Jun 15, 1935, 79 y.o.   MRN: [id number]     Subjective:  Patient reports pain as mild to moderate.  Pain at rest minimal but increases with motion.  Denies any CP or SOB  Objective:   VITALS:   Filed Vitals:   10/20/14 1811 10/20/14 1945 10/21/14 0010 10/21/14 0430  BP: 143/77 123/78 116/74 133/87  Pulse: 89 91 83 87  Temp: 97.8 F (36.6 C) 99.5 F (37.5 C) 98.7 F (37.1 C) 98.9 F (37.2 C)  TempSrc: Oral Oral Oral Oral  Resp: 18 16 16 16   SpO2: 95% 96% 94% 95%    ABD soft Sensation intact distally Dorsiflexion/Plantar flexion intact Incision: dressing C/D/I and no drainage Good foot and ankle motion  Less pain with log roll  Lab Results  Component Value Date   WBC 13.4* 10/21/2014   HGB 13.3 10/21/2014   HCT 39.5 10/21/2014   MCV 93.8 10/21/2014   PLT 160 10/21/2014   BMET    Component Value Date/Time   NA 137 10/21/2014 0415   K 4.2 10/21/2014 0415   CL 104 10/21/2014 0415   CO2 24 10/21/2014 0415   GLUCOSE 104* 10/21/2014 0415   BUN 18 10/21/2014 0415   CREATININE 1.55* 10/21/2014 0415   CALCIUM 8.6* 10/21/2014 0415   GFRNONAA 42* 10/21/2014 0415   GFRAA 48* 10/21/2014 0415     Assessment/Plan: 1 Day Post-Op   Active Problems:   Diabetes mellitus type 2 in obese   Hypertension   Chronic renal insufficiency, stage III (moderate)   A-fib   Prostate cancer   Closed left hip fracture   Advance diet Up with therapy Continue plan per medicine WBAT Dry dressing PRN    [name] 10/21/2014, 6:56 AM  Discussed and agree with above.   [name], MD Cell [phone]   ADDENDUM:  PATIENT DOES NOT HAVE ALZHEIMER'S.  INCORRECTLY PULLED FORWARD FROM HISTORICAL INACCURATE NOTE.  THIS NOTE HAS BEEN CORRECTED.

## 2014-10-21 NOTE — Consult Note (Signed)
CARDIOLOGY CONSULT NOTE  Patient ID: Howard Charles MRN: [id number] DOB/AGE: 01/21/1936 79 y.o.  Admit date: 10/19/2014 Referring Physician:  [facility] Primary Physician:  Horatio Pel, MD Reason for Consultation: A. Fib with RVR  HPI: Howard Charles  is a 79 y.o. male with a history of hypertension, diabetes, hyperlipidemia, paroxysmal A. Fib.  He is on Xarelto for CVA prophylaxis due to a.fib.  He was transported to the emergency department on 10/19/2014 after falling while shopping.  He states he just lost his balance and denies any dizziness or syncope.  Head CT was performed which was negative for intercranial bleed.  X-ray revealed left intertrochanteric hip fracture.  He underwent surgical repair on 10/20/2014. Xarelto was resumed for a.fib and DVT prophylaxis.  Since surgery, patient has had multiple episodes of paroxysmal A. fib, several episodes with heart rates in the 160s.  Coreg was increased to 25 mg twice a day, however he continues to convert from NSR to A. fib with RVR.  He remains asymptomatic during these episodes.  He reports significant pain and nausea and has just recently been able to keep clear liquids down.  Otherwise, denies any chest pain, shortness of breath, palpitations, or dizziness.    Past Medical History  Diagnosis Date  . Diabetes mellitus type 2 in obese   . Diabetic neuropathy   . Hypertension   . GERD (gastroesophageal reflux disease)   . Gout   . Left knee DJD   . Right knee DJD   . PONV (postoperative nausea and vomiting)     H/O ....BUT LAST FEW TIMES---NONE  . Bilateral hearing loss     WEARS HEARING AIDS........RIGHT IS BETTER THEN LEFT  . Chronic renal insufficiency, stage III (moderate)     Creatinine 1.47  . Dysrhythmia     h/o A Fib  . On supplemental oxygen therapy     at night     Past Surgical History  Procedure Laterality Date  . Knee arthroscopy  2008    left   . Knee arthroscopy  2012    left  . Knee  arthroscopy  2002    right  . Hemorrhoid surgery    . Inner ear surgery    . Shoulder arthroscopy with rotator cuff repair  2007  . Suprapubic prostatectomy      21 YRS  AGO  . Cholecystectomy    . Tonsillectomy    . Total knee arthroplasty  06/26/2012    Procedure: TOTAL KNEE ARTHROPLASTY;  Surgeon: Lorn Junes, MD;  Location: [place];  Service: Orthopedics;  Laterality: Left;  . Colonoscopy    . Total knee arthroplasty Right 05/21/2013    Procedure: TOTAL KNEE ARTHROPLASTY;  Surgeon: Lorn Junes, MD;  Location: [place];  Service: Orthopedics;  Laterality: Right;     Family History  Problem Relation Age of Onset  . Hypertension Mother   . Diabetes Mother   . Heart disease Mother   . Heart disease Father   . Heart attack Father   . Hypertension Father   . Diabetes Mellitus II Father      Social History: History   Social History  . Marital Status: Married    Spouse Name: N/A  . Number of Children: N/A  . Years of Education: N/A   Occupational History  . Not on file.   Social History Main Topics  . Smoking status: Never Smoker   . Smokeless tobacco: Never Used  . Alcohol Use: No  .  Drug Use: No  . Sexual Activity: Not on file   Other Topics Concern  . Not on file   Social History Narrative     Prescriptions prior to admission  Medication Sig Dispense Refill Last Dose  . allopurinol (ZYLOPRIM) 300 MG tablet Take 300 mg by mouth every morning.    10/19/2014 at Unknown time  . carvedilol (COREG) 6.25 MG tablet Take 6.25 mg by mouth 2 (two) times daily with a meal.   10/19/2014 at 0800  . insulin glargine (LANTUS) 100 UNIT/ML injection Inject 52 Units into the skin daily before breakfast.    10/19/2014 at Unknown time  . Multiple Vitamins-Minerals (CENTRUM SILVER ULTRA MENS PO) Take 1 tablet by mouth daily.   10/19/2014 at Unknown time  . omega-3 acid ethyl esters (LOVAZA) 1 G capsule Take 1 g by mouth every morning.   10/19/2014 at Unknown time  . pantoprazole  (PROTONIX) 40 MG tablet Take 40 mg by mouth every evening.    10/18/2014 at Unknown time  . rosuvastatin (CRESTOR) 10 MG tablet Take 10 mg by mouth every evening.    10/18/2014 at Unknown time  . saxagliptin HCl (ONGLYZA) 5 MG TABS tablet Take 5 mg by mouth every morning.    10/19/2014 at Unknown time  . telmisartan-hydrochlorothiazide (MICARDIS HCT) 80-25 MG per tablet Take 1 tablet by mouth every morning.    10/19/2014 at Unknown time  . [DISCONTINUED] rivaroxaban (XARELTO) 20 MG TABS tablet Take 20 mg by mouth every morning.    10/19/2014 at 0800  . oxyCODONE (OXY IR/ROXICODONE) 5 MG immediate release tablet 1-2 tablets every 4-6 hrs as needed for pain (Patient not taking: Reported on 10/19/2014) 100 tablet 0 Not Taking at Unknown time   ROS: General: no fevers/chills/night sweats Eyes: no blurry vision, diplopia, or amaurosis ENT: no sore throat or hearing loss Resp: no cough, wheezing, or hemoptysis CV: no chest pain, edema, or palpitations GI: Reports abdominal pain, nausea, and vomiting; denies diarrhea or constipation GU: no dysuria, frequency, or hematuria Skin: no rash Neuro: no headache, numbness, tingling, or weakness of extremities Musculoskeletal: left hip pain s/p surgery Heme: no bleeding, DVT, or easy bruising Endo: no polydipsia or polyuria   Physical Exam: Blood pressure 139/83, pulse 107, temperature 98.7 F (37.1 C), temperature source Oral, resp. rate 18, SpO2 89 %.   General appearance: alert, cooperative, appears stated age, no distress and moderately obese Neck: no carotid bruit and no JVD Lungs: clear to auscultation bilaterally Heart: regular rate and rhythm, S1, S2 normal, no murmur, click, rub or gallop Pulses: 2+ and symmetric  Labs:   Lab Results  Component Value Date   WBC 13.4* 10/21/2014   HGB 13.3 10/21/2014   HCT 39.5 10/21/2014   MCV 93.8 10/21/2014   PLT 160 10/21/2014    Recent Labs Lab 10/21/14 0415  NA 137  K 4.2  CL 104  CO2 24  BUN  18  CREATININE 1.55*  CALCIUM 8.6*  GLUCOSE 104*    Lipid Panel  No results found for: CHOL, TRIG, HDL, CHOLHDL, VLDL, LDLCALC  BNP (last 3 results) No results for input(s): BNP in the last 8760 hours.  ProBNP (last 3 results) No results for input(s): PROBNP in the last 8760 hours.  HEMOGLOBIN A1C Lab Results  Component Value Date   HGBA1C 7.1* 06/26/2012   MPG 157* 06/26/2012    Cardiac Panel (last 3 results) No results for input(s): CKTOTAL, CKMB, TROPONINI, RELINDX in the last 8760 hours.  Lab Results  Component Value Date   TROPONINI <0.30 06/27/2012     TSH No results for input(s): TSH in the last 8760 hours.  EKG 10/19/2014 @ 1804: A. fib with RVR at a ventricular rate of 132 bpm  EKG 10/19/2014 @ 0312: NSR at a rate of 84, LAE, no evidence of ischemia  Outpatient Echocardiogram 09/17/2014: 1. Left ventricle cavity is normal in size. Mild concentric hypertrophy of the left ventricle. Normal global wall motion. Unable to evaluate diastolic function due to A. Fibrillation. Calculated EF 56%. 2. Left atrial cavity is moderately dilated. 3. Mild tricuspid regurgitation. No evidence of pulmonary hypertension. 4. IVC is dilated with blunted respiratory response. Compared to the study done on 03/15/2013,  no significant change.   Radiology: Dg Abd Portable 1v  10/21/2014   CLINICAL DATA:  Nausea vomiting following hip surgery  EXAM: PORTABLE ABDOMEN - 1 VIEW  COMPARISON:  None.  FINDINGS: Colon is distended with gas although not significantly dilated. Correlation with physical exam is recommended. This may represent a postoperative colonic ileus. Postsurgical changes are noted in the proximal left hip consistent with the patient's given clinical history. Findings of prostatectomy and cholecystectomy are noted as well.  IMPRESSION: Gaseous distension of the colon without true obstructive change. This may represent a postoperative ileus.   Electronically Signed   By: Inez Catalina M.D.   On: 10/21/2014 09:01   Dg C-arm 1-60 Min  10/20/2014   CLINICAL DATA:  Left intertrochanteric fracture.  EXAM: DG C-ARM 61-120 MIN; LEFT FEMUR 2 VIEWS  TECHNIQUE: C-arm images of the left femur were obtained in the operating room.  FLUOROSCOPY TIME:  Radiation Exposure Index (as provided by the fluoroscopic device): Not applicable  If the device does not provide the exposure index:  Fluoroscopy Time (in minutes and seconds):  0 min 49 seconds  Number of Acquired Images:  For  COMPARISON:  Left hip radiographs dated 10/19/2014.  FINDINGS: Intra medullary rod fixation of the previously demonstrated left intertrochanteric fracture with anatomic position and alignment of the fracture fragments. Left knee prosthesis. No new fractures or dislocations. Lower pelvic surgical clips.  IMPRESSION: Hardware fixation of the previously demonstrated left intertrochanteric fracture with anatomic position and alignment.   Electronically Signed   By: Claudie Revering M.D.   On: 10/20/2014 10:31   Dg Hip Port Unilat With Pelvis 1v Left  10/20/2014   CLINICAL DATA:  Immediately postop ORIF of an intertrochanteric left femoral neck fracture.  EXAM: LEFT HIP (WITH PELVIS) 1 VIEW PORTABLE  COMPARISON:  Left hip x-rays with AP pelvis yesterday.  FINDINGS: Examination was performed portably. ORIF of the intertrochanteric left femoral neck fracture with placement of an intramedullary nail and compression screw. Anatomic alignment. No acute complicating features. Prior left total knee arthroplasty.  IMPRESSION: Anatomic alignment post ORIF of the intertrochanteric left femoral neck fracture without acute complicating features.   Electronically Signed   By: Evangeline Dakin M.D.   On: 10/20/2014 10:33   Dg Femur Min 2 Views Left  10/20/2014   CLINICAL DATA:  Left intertrochanteric fracture.  EXAM: DG C-ARM 61-120 MIN; LEFT FEMUR 2 VIEWS  TECHNIQUE: C-arm images of the left femur were obtained in the operating room.   FLUOROSCOPY TIME:  Radiation Exposure Index (as provided by the fluoroscopic device): Not applicable  If the device does not provide the exposure index:  Fluoroscopy Time (in minutes and seconds):  0 min 49 seconds  Number of Acquired Images:  For  COMPARISON:  Left hip radiographs dated 10/19/2014.  FINDINGS: Intra medullary rod fixation of the previously demonstrated left intertrochanteric fracture with anatomic position and alignment of the fracture fragments. Left knee prosthesis. No new fractures or dislocations. Lower pelvic surgical clips.  IMPRESSION: Hardware fixation of the previously demonstrated left intertrochanteric fracture with anatomic position and alignment.   Electronically Signed   By: Claudie Revering M.D.   On: 10/20/2014 10:31    Scheduled Meds: . allopurinol  300 mg Oral q morning - 10a  . carvedilol  12.5 mg Oral BID WC  . diltiazem  120 mg Oral BID  . docusate sodium  100 mg Oral BID  . ferrous sulfate  325 mg Oral TID PC  . insulin aspart  0-9 Units Subcutaneous 6 times per day  . insulin glargine  40 Units Subcutaneous QHS  . rivaroxaban  20 mg Oral Q supper  . rosuvastatin  10 mg Oral QPM  . senna  1 tablet Oral BID   Continuous Infusions: . sodium chloride 75 mL/hr (10/21/14 0322)   PRN Meds:.acetaminophen **OR** acetaminophen, alum & mag hydroxide-simeth, bisacodyl, HYDROcodone-acetaminophen, HYDROmorphone (DILAUDID) injection, menthol-cetylpyridinium **OR** phenol, meperidine (DEMEROL) injection, morphine injection, ondansetron **OR** ondansetron (ZOFRAN) IV, polyethylene glycol, promethazine, sorbitol, milk of mag, mineral oil, glycerin (SMOG) enema  ASSESSMENT AND PLAN:  1. Paroxysmal a.fib: presently in NSR, CHA2DS2-VASCScore: Risk Score  4,  Yearly risk of stroke  4.0%. Recommendation: Anticoagulation: Xarelto; [place] Has Bled: Score 2.  Estimated risk of major bleeding at 1 year with [place] 1.88-3.2% 2. HTN 3. DM with CKD III: creatinine stable at 1.55 4.  HLD  Recommendation: Paroxymal a.fib with RVR likely secondary to recent surgery and pain.  Decrease coreg to 12.5mg  bid, start Cardizem CD 120mg  bid.  Blood pressure controlled. Continue Xarelto.  Rachel Bo, NP-C 10/21/2014, 4:08 PM [facility]. [place] Pager: [phone] Office: [phone]  I have personally reviewed the patient's record and performed physical exam and agree with the assessment and plan of Ms. Neldon Labella, NP-C.  Constipation, abdomen is bloated but non tender and BS are hyperactive, may need suppository if no BM by tomorrow. Pain management prior to ambulation.  Adrian Prows, MD 10/21/2014, 5:50 PM [facility]. [place] Pager: [phone] Office: [phone] If no answer: Cell:  [phone]

## 2014-10-22 ENCOUNTER — Encounter (HOSPITAL_COMMUNITY): Payer: Self-pay | Admitting: Orthopedic Surgery

## 2014-10-22 ENCOUNTER — Inpatient Hospital Stay (HOSPITAL_COMMUNITY): Payer: Medicare Other

## 2014-10-22 DIAGNOSIS — K9189 Other postprocedural complications and disorders of digestive system: Secondary | ICD-10-CM

## 2014-10-22 DIAGNOSIS — K567 Ileus, unspecified: Secondary | ICD-10-CM

## 2014-10-22 HISTORY — DX: Ileus, unspecified: K56.7

## 2014-10-22 LAB — GLUCOSE, CAPILLARY
GLUCOSE-CAPILLARY: 122 mg/dL — AB (ref 65–99)
GLUCOSE-CAPILLARY: 170 mg/dL — AB (ref 65–99)
GLUCOSE-CAPILLARY: 128 mg/dL — AB (ref 65–99)
Glucose-Capillary: 107 mg/dL — ABNORMAL HIGH (ref 65–99)
Glucose-Capillary: 140 mg/dL — ABNORMAL HIGH (ref 65–99)
Glucose-Capillary: 151 mg/dL — ABNORMAL HIGH (ref 65–99)

## 2014-10-22 LAB — CBC
HCT: 37.2 % — ABNORMAL LOW (ref 39.0–52.0)
Hemoglobin: 12.8 g/dL — ABNORMAL LOW (ref 13.0–17.0)
MCH: 31.8 pg (ref 26.0–34.0)
MCHC: 34.4 g/dL (ref 30.0–36.0)
MCV: 92.3 fL (ref 78.0–100.0)
PLATELETS: 176 10*3/uL (ref 150–400)
RBC: 4.03 MIL/uL — ABNORMAL LOW (ref 4.22–5.81)
RDW: 14.3 % (ref 11.5–15.5)
WBC: 14.4 10*3/uL — ABNORMAL HIGH (ref 4.0–10.5)

## 2014-10-22 LAB — BASIC METABOLIC PANEL
Anion gap: 10 (ref 5–15)
BUN: 21 mg/dL — ABNORMAL HIGH (ref 6–20)
CHLORIDE: 104 mmol/L (ref 101–111)
CO2: 24 mmol/L (ref 22–32)
Calcium: 9 mg/dL (ref 8.9–10.3)
Creatinine, Ser: 1.37 mg/dL — ABNORMAL HIGH (ref 0.61–1.24)
GFR calc Af Amer: 56 mL/min — ABNORMAL LOW (ref >60)
GFR calc non Af Amer: 48 mL/min — ABNORMAL LOW (ref >60)
GLUCOSE: 133 mg/dL — AB (ref 65–99)
Potassium: 3.6 mmol/L (ref 3.5–5.1)
Sodium: 138 mmol/L (ref 135–145)

## 2014-10-22 MED ORDER — METOCLOPRAMIDE HCL 5 MG/ML IJ SOLN
10.0000 mg | Freq: Four times a day (QID) | INTRAMUSCULAR | Status: DC
  Administered 2014-10-22 – 2014-10-23 (×4): 10 mg via INTRAVENOUS
  Filled 2014-10-22 (×6): qty 2

## 2014-10-22 MED ORDER — GLYCERIN (LAXATIVE) 2.1 G RE SUPP
1.0000 | Freq: Once | RECTAL | Status: AC
  Administered 2014-10-22: 1 via RECTAL
  Filled 2014-10-22: qty 1

## 2014-10-22 NOTE — Progress Notes (Addendum)
     Subjective:  Patient reports pain as moderate.  More pain in hip with PT.  No flatus, positive abdominal pain last night, better now.   Objective:   VITALS:   Filed Vitals:   10/21/14 2129 10/21/14 2202 10/22/14 0311 10/22/14 0823  BP:  129/82 108/86 118/74  Pulse: 86 89 62 128  Temp:  97.9 F (36.6 C) 99.3 F (37.4 C)   TempSrc:  Oral Oral   Resp:  18 18   SpO2: 94% 90% 90%     Neurologically intact Dorsiflexion/Plantar flexion intact Incision: dressing C/D/I Significant abdominal distension, no rebound.  Lab Results  Component Value Date   WBC 14.4* 10/22/2014   HGB 12.8* 10/22/2014   HCT 37.2* 10/22/2014   MCV 92.3 10/22/2014   PLT 176 10/22/2014   BMET    Component Value Date/Time   NA 138 10/22/2014 0408   K 3.6 10/22/2014 0408   CL 104 10/22/2014 0408   CO2 24 10/22/2014 0408   GLUCOSE 133* 10/22/2014 0408   BUN 21* 10/22/2014 0408   CREATININE 1.37* 10/22/2014 0408   CALCIUM 9.0 10/22/2014 0408   GFRNONAA 48* 10/22/2014 0408   GFRAA 56* 10/22/2014 0408     Assessment/Plan: 2 Days Post-Op   Active Problems:   Diabetes mellitus type 2 in obese   Hypertension   Chronic renal insufficiency, stage III (moderate)   A-fib   Prostate cancer   Closed left hip fracture   Atrial fibrillation with RVR   Up with therapy NPO x meds, he is developing an ileus.  Will also initiate reglan.   Not ready for dc.     Delorse Shane P 10/22/2014, 9:45 AM   [name], MD Cell [phone]    ADDENDUM:  PATIENT DOES NOT HAVE ALZHEIMER'S.  INCORRECTLY PULLED FORWARD FROM HISTORICAL INACCURATE NOTE.  THIS NOTE HAS BEEN CORRECTED.

## 2014-10-22 NOTE — Clinical Social Work Placement (Signed)
   CLINICAL SOCIAL WORK PLACEMENT  NOTE  Date:  10/22/2014  Patient Details  Name: Howard Charles MRN: [id number] Date of Birth: 29-Oct-1935  Clinical Social Work is seeking post-discharge placement for this patient at the [place] level of care (*CSW will initial, date and re-position this form in  chart as items are completed):  Yes   Patient/family provided with [facility] Work Department's list of facilities offering this level of care within the geographic area requested by the patient (or if unable, by the patient's family).  Yes   Patient/family informed of their freedom to choose among providers that offer the needed level of care, that participate in Medicare, Medicaid or managed care program needed by the patient, have an available bed and are willing to accept the patient.  Yes   Patient/family informed of [facility]'s ownership interest in [facility] and [facility], as well as of the fact that they are under no obligation to receive care at these facilities.  PASRR submitted to EDS on 10/22/14     PASRR number received on 10/22/14     Existing PASRR number confirmed on       FL2 transmitted to all facilities in geographic area requested by pt/family on 10/22/14     FL2 transmitted to all facilities within larger geographic area on       Patient informed that his/her managed care company has contracts with or will negotiate with certain facilities, including the following:            Patient/family informed of bed offers received.  Patient chooses bed at       Physician recommends and patient chooses bed at      Patient to be transferred to   on  .  Patient to be transferred to facility by       Patient family notified on   of transfer.  Name of family member notified:        PHYSICIAN Please sign FL2     Additional Comment:    _______________________________________________ Cranford Mon, LCSW 10/22/2014,  11:14 AM

## 2014-10-22 NOTE — Progress Notes (Signed)
Physical Therapy Treatment Patient Details Name: [name] MRN: [id number] DOB: 24-May-1936 Today's Date: 10/22/2014    History of Present Illness Pt is a 79 y/o M s/p fall and L hip fx.  Pt's PMH includes DM, diabetic neuropathy, gout, HTN, B TKA, B hearing loss, chronic renal insufficiency, on supplemental O2 at night, a fib, rotator cuff arthroscopy.    PT Comments    Pt progressing towards physical therapy goals. Pain was a limiting factor during session, as pt states the pain medicine has been increasing his heart rate. Despite this, pt was able to transfer OOB to the recliner chair with +2 assist to stand. Increased time required. Will continue to follow and progress as able per POC.   Follow Up Recommendations  SNF;Supervision for mobility/OOB     Equipment Recommendations  Rolling walker with 5" wheels    Recommendations for Other Services       Precautions / Restrictions Precautions Precautions: Fall Restrictions Weight Bearing Restrictions: Yes LLE Weight Bearing: Weight bearing as tolerated    Mobility  Bed Mobility Overal bed mobility: Needs Assistance Bed Mobility: Supine to Sit     Supine to sit: Max assist;+2 for physical assistance Sit to supine: Mod assist   General bed mobility comments: Assist w/ managing BLEs and using bed pad to bring hips to sitting EOB.  Pt w/ heavy use of bed rails and required max verbal cues during supine<>sit.    Transfers Overall transfer level: Needs assistance Equipment used: Rolling walker (2 wheeled) Transfers: Sit to/from [facility] Sit to Stand: Mod assist;+2 physical assistance Stand pivot transfers: Min assist       General transfer comment: Pt required increased assist to power-up to full standing position. Pt was able to maintain standing fairly well (required cues to keep hands on walker) but required increased time before he was able to attempt pivotal steps around to the chair. Assist was  required to advance LLE.  Ambulation/Gait             General Gait Details: Unable at this time.    Stairs            Wheelchair Mobility    Modified Rankin (Stroke Patients Only)       Balance Overall balance assessment: Needs assistance;History of Falls Sitting-balance support: Bilateral upper extremity supported;Feet supported Sitting balance-Leahy Scale: Fair Sitting balance - Comments: Pt limited in sitting due to pain.  Pt more comfortable with a posterior pelvic tilt which makes it hard to maintain sitting wo assist. Postural control: Posterior lean Standing balance support: Bilateral upper extremity supported;During functional activity Standing balance-Leahy Scale: Zero Standing balance comment: Pt had to have considerable outside support to remain standing.                    Cognition Arousal/Alertness: Awake/alert Behavior During Therapy: WFL for tasks assessed/performed Overall Cognitive Status: History of cognitive impairments - at baseline                      Exercises      General Comments General comments (skin integrity, edema, etc.): pt tends to hold breath during therapy.  Worked with pt on pursed lip breathing to control the pain and anxiety of mobility.       Pertinent Vitals/Pain Pain Assessment: Faces Pain Score: 8  Faces Pain Scale: Hurts whole lot Pain Location: L hip with mobility Pain Descriptors / Indicators: Operative site guarding Pain Intervention(s): Limited  activity within patient's tolerance;Monitored during session;Repositioned    Home Living Family/patient expects to be discharged to:: Unsure Living Arrangements: Spouse/significant other Available Help at Discharge: Family;Available PRN/intermittently Type of Home: House Home Access: Stairs to enter Entrance Stairs-Rails: None Home Layout: Able to live on main level with bedroom/bathroom;Multi-level;Other (Comment) (only 1/2 bath on main floor) Home  Equipment: Walker - 2 wheels;Cane - single point;Crutches;Bedside commode;Shower seat;Wheelchair - manual Additional Comments: Also a flight to get down to the basement w/ railing on L side going down.  Has workshop, computer, laundry, freezer.  Pt does not have need to go downstairs upon initial return home.    Prior Function Level of Independence: Independent with assistive device(s)      Comments: cane when L hip was bothering him   PT Goals (current goals can now be found in the care plan section) Acute Rehab PT Goals Patient Stated Goal: to get rid of some of this pain. PT Goal Formulation: With patient Time For Goal Achievement: 10/28/14 Potential to Achieve Goals: Good Progress towards PT goals: Progressing toward goals    Frequency  Min 3X/week    PT Plan Current plan remains appropriate    Co-evaluation             End of Session Equipment Utilized During Treatment: Gait belt Activity Tolerance: Patient limited by pain Patient left: in chair;with call bell/phone within reach;with family/visitor present     Time: 7494-4967 PT Time Calculation (min) (ACUTE ONLY): 23 min  Charges:  $Gait Training: 8-22 mins $Therapeutic Activity: 8-22 mins                    G Codes:      Rolinda Roan 11-13-14, 1:39 PM   Rolinda Roan, PT, DPT Acute Rehabilitation Services Pager: [phone]

## 2014-10-22 NOTE — Evaluation (Signed)
Occupational Therapy Evaluation Patient Details Name: Howard Charles MRN: [id number] DOB: March 12, 1936 Today's Date: 10/22/2014    History of Present Illness Pt is a 79 y/o M s/p fall and L hip fx.  Pt's PMH includes DM, diabetic neuropathy, gout, HTN, B TKA, B hearing loss, chronic renal insufficiency, on supplemental O2 at night, a fib, rotator cuff arthroscopy.   Clinical Impression   Pt admitted with the above diagnosis and has the deficits listed below. Pt would benefit from cont OT to increase I with basic adls and adl transfers so he can eventually return home safely with his wife after rehab.      Follow Up Recommendations  SNF;Supervision/Assistance - 24 hour    Equipment Recommendations  None recommended by OT    Recommendations for Other Services       Precautions / Restrictions Precautions Precautions: Fall Restrictions Weight Bearing Restrictions: Yes LLE Weight Bearing: Weight bearing as tolerated      Mobility Bed Mobility Overal bed mobility: Needs Assistance Bed Mobility: Supine to Sit;Sit to Supine     Supine to sit: Max assist;HOB elevated Sit to supine: Mod assist   General bed mobility comments: Assist w/ managing BLEs and using bed pad to bring hips to sitting EOB.  Pt w/ heavy use of bed rails and required max verbal cues during supine<>sit.    Transfers Overall transfer level: Needs assistance Equipment used: Rolling walker (2 wheeled) Transfers: Sit to/from Stand Sit to Stand: Max assist         General transfer comment: Pt able to get to feet with walker and +1 max assist.  Pt in a lot of pain and returned to sitting after standing for a few seconds.    Balance Overall balance assessment: Needs assistance Sitting-balance support: Bilateral upper extremity supported;Feet supported Sitting balance-Leahy Scale: Fair Sitting balance - Comments: Pt limited in sitting due to pain.  Pt more comfortable with a posterior pelvic tilt which makes  it hard to maintain sitting wo assist. Postural control: Posterior lean Standing balance support: Bilateral upper extremity supported;During functional activity Standing balance-Leahy Scale: Zero Standing balance comment: Pt had to have considerable outside support to remain standing.                            ADL Overall ADL's : Needs assistance/impaired Eating/Feeding: Set up;Sitting   Grooming: Set up;Sitting   Upper Body Bathing: Set up;Sitting   Lower Body Bathing: Maximal assistance;+2 for physical assistance;Sit to/from stand Lower Body Bathing Details (indicate cue type and reason): +2 to stand and assist to wash bottom while standing. Pt also needs assist getting to LLE and foot. Upper Body Dressing : Set up;Sitting   Lower Body Dressing: Maximal assistance;+2 for physical assistance;Sit to/from stand Lower Body Dressing Details (indicate cue type and reason): assist in standing and +2 to get into standing.  ONce up, pt does better but needs max assist to maintain balance to manage clothes in standing and assist to donn pants socks and shoes over L foot. Toilet Transfer: +2 for physical assistance;Maximal assistance;Stand-pivot;RW;BSC Toilet Transfer Details (indicate cue type and reason): Pt needs +2 to get onto his feet then is able to pivot with mod assist and a walker. Toileting- Clothing Manipulation and Hygiene: Total assistance;+2 for physical assistance;Sit to/from stand Toileting - Clothing Manipulation Details (indicate cue type and reason): assist to get into stnading and pt must hold to walker while second person cleans pt.  Functional mobility during ADLs: Maximal assistance;+2 for physical assistance;Rolling walker General ADL Comments: Pt very motivated but in great pain. Pt very limited with all mobility during adls and with LE adls.     Vision Vision Assessment?: No apparent visual deficits   Perception     Praxis      Pertinent  Vitals/Pain Pain Assessment: 0-10 Pain Score: 8  Pain Location: L hip only with movement Pain Descriptors / Indicators: Aching;Grimacing Pain Intervention(s): Monitored during session;Repositioned;Limited activity within patient's tolerance;Other (comment) (pt declined pain meds before therapy.)     Hand Dominance Right   Extremity/Trunk Assessment Upper Extremity Assessment Upper Extremity Assessment: Overall WFL for tasks assessed (rotator cuff surgery in PMH)   Lower Extremity Assessment Lower Extremity Assessment: Defer to PT evaluation   Cervical / Trunk Assessment Cervical / Trunk Assessment: Normal   Communication Communication Communication: No difficulties   Cognition Arousal/Alertness: Awake/alert Behavior During Therapy: WFL for tasks assessed/performed Overall Cognitive Status: History of cognitive impairments - at baseline                     General Comments       Exercises       Shoulder Instructions      Home Living Family/patient expects to be discharged to:: Skilled nursing facility Living Arrangements: Spouse/significant other Available Help at Discharge: Family;Available PRN/intermittently Type of Home: House Home Access: Stairs to enter Entrance Stairs-Number of Steps: 1 Entrance Stairs-Rails: None Home Layout: Able to live on main level with bedroom/bathroom;Multi-level;Other (Comment) (only 1/2 bath on main floor) Alternate Level Stairs-Number of Steps: 14 Alternate Level Stairs-Rails: Right           Home Equipment: Walker - 2 wheels;Cane - single point;Crutches;Bedside commode;Shower seat;Wheelchair - manual   Additional Comments: Also a flight to get down to the basement w/ railing on L side going down.  Has workshop, computer, laundry, freezer.  Pt does not have need to go downstairs upon initial return home.      Prior Functioning/Environment Level of Independence: Independent with assistive device(s)        Comments:  cane when L hip was bothering him    OT Diagnosis: Generalized weakness;Cognitive deficits;Acute pain   OT Problem List: Decreased strength;Decreased range of motion;Decreased activity tolerance;Impaired balance (sitting and/or standing);Decreased cognition;Decreased safety awareness;Decreased knowledge of use of DME or AE;Pain   OT Treatment/Interventions: Self-care/ADL training;DME and/or AE instruction;Therapeutic activities    OT Goals(Current goals can be found in the care plan section) Acute Rehab OT Goals Patient Stated Goal: to get rid of some of this pain. OT Goal Formulation: With patient Time For Goal Achievement: 11/05/14 Potential to Achieve Goals: Good ADL Goals Pt Will Perform Lower Body Bathing: with mod assist;sit to/from stand Pt Will Perform Lower Body Dressing: with mod assist;sit to/from stand Pt Will Transfer to Toilet: with mod assist;bedside commode Additional ADL Goal #1: Pt will sit on EOB with S to perform grooming adls.  OT Frequency: Min 2X/week   Barriers to D/C: Decreased caregiver support  wife only one available to assist.       Co-evaluation              End of Session Equipment Utilized During Treatment: Rolling walker;Gait belt Nurse Communication: Mobility status  Activity Tolerance: Patient limited by pain Patient left: in chair;with call bell/phone within reach   Time: 1140-1210 OT Time Calculation (min): 30 min Charges:  OT General Charges $OT Visit: 1 Procedure OT Evaluation $Initial  OT Evaluation Tier I: 1 Procedure OT Treatments $Self Care/Home Management : 8-22 mins G-Codes:    Glenford Peers 11/14/14, 12:29 PM  [phone]

## 2014-10-22 NOTE — Progress Notes (Signed)
PROGRESS NOTE  Howard Charles RFF:[id number] DOB: [birth year] DOA: 10/19/2014 PCP: Horatio Pel, MD  HPI: 79 y.o. Male with history of Diabetes mellitus type 2 in obese with neuropathy, Hypertension, GERD, Gout, Left knee DJD, Right knee DJD, CKD III, A fib, presented with fall while at the grocery store, Patient lost balance and fell hitting his head and left hip he was not able to get up. EMS was called and he arrived to ER. Plain imaging showed Intertochanteric fracture of the left hip. Patient with hx of A.fib on Xarelto last dose this AM and coreg. On arrival to ER he was tachycardic with HR up to 130 with A.fib he was given a dose of diltiazem and improved with HR down to 80's currently converted to sinus rhythm with heart rate of 70 Reports hx of DM on Lantus he checks his BG twice a day. Reports good control HG 6.9 He has been seen recently by his cardiologist and had a an echo done was told it was ok. NO records in Epic. Denies any hx of CAD. No hx of chest pain. Patient goes to the gym 2-4 days a week. Does yard work. Reports he is on oxygen 2 L at night. In ER Dr. Mardelle Matte has been consulted will plan to operate in AM  Subjective / 24 H Interval events - no complaints this morning, abdomen feels better.  - Nausea last night, non today  - no chest pain, no palpitations  Assessment/Plan: Active Problems:   Alzheimer's disease   Diabetes mellitus type 2 in obese   Hypertension   Chronic renal insufficiency, stage III (moderate)   A-fib   Prostate cancer   Closed left hip fracture   Atrial fibrillation with RVR  Post op ileus - patient with nausea and progressive abdominal distention 5/23, abdominal XR with possible ileus - bowel regimen, no BMs with suppository or enema yesterday - repeat abdominal film today with persistent ileus, NPO except meds  A-fib with RVR - in and out of A fib, consulted cardiology yesterday, added Diltiazem - telemetry reviewed this morning  with elevated heart rate until about 4-5 am, has maintained sinus since.  - anticoagulated with Xarelto - continue telemetry   Closed left hip fracture  - orthopedic surgery consulted, Dr. Mardelle Matte perfomed repair with intertrochanteric IM nail on 5/22. - Xarelto restarted 5/23, discussed with Dr. Mardelle Matte - PT recommending SNF, patient agreeable, for now discharge on hold given #1 and #2  Alzheimer's disease  - very subtle and hard to notice, patient is alert and oriented to place time situation, good insight  CKD stage III - baseline Cr ~1.5, currently at baseline - avoid nephrotoxic medications  Hypertension  - hold Micardis for now  Diabetes mellitus  - start Lantus to 40U instead of 52 at home, SSI - A1C 7.1 in 2014, repeat pending - CBGs at goal today    Diet: Diet clear liquid Room service appropriate?: Yes; Fluid consistency:: Thin Fluids: none  DVT Prophylaxis: Xarelto  Code Status: Full Code Family Communication: d/w patient Disposition Plan: remain inpatient  Consultants:  Orthopedic surgery   Cardiology   Procedures:  IM nail 5/20   Antibiotics  Anti-infectives    Start     Dose/Rate Route Frequency Ordered Stop   10/20/14 1230  ceFAZolin (ANCEF) IVPB 2 g/50 mL premix     2 g 100 mL/hr over 30 Minutes Intravenous Every 6 hours 10/20/14 1114 10/20/14 1912       Studies  Dg Abd Portable 1v  10/21/2014   CLINICAL DATA:  Nausea vomiting following hip surgery  EXAM: PORTABLE ABDOMEN - 1 VIEW  COMPARISON:  None.  FINDINGS: Colon is distended with gas although not significantly dilated. Correlation with physical exam is recommended. This may represent a postoperative colonic ileus. Postsurgical changes are noted in the proximal left hip consistent with the patient's given clinical history. Findings of prostatectomy and cholecystectomy are noted as well.  IMPRESSION: Gaseous distension of the colon without true obstructive change. This may represent a  postoperative ileus.   Electronically Signed   By: Inez Catalina M.D.   On: 10/21/2014 09:01   Dg C-arm 1-60 Min  10/20/2014   CLINICAL DATA:  Left intertrochanteric fracture.  EXAM: DG C-ARM 61-120 MIN; LEFT FEMUR 2 VIEWS  TECHNIQUE: C-arm images of the left femur were obtained in the operating room.  FLUOROSCOPY TIME:  Radiation Exposure Index (as provided by the fluoroscopic device): Not applicable  If the device does not provide the exposure index:  Fluoroscopy Time (in minutes and seconds):  0 min 49 seconds  Number of Acquired Images:  For  COMPARISON:  Left hip radiographs dated 10/19/2014.  FINDINGS: Intra medullary rod fixation of the previously demonstrated left intertrochanteric fracture with anatomic position and alignment of the fracture fragments. Left knee prosthesis. No new fractures or dislocations. Lower pelvic surgical clips.  IMPRESSION: Hardware fixation of the previously demonstrated left intertrochanteric fracture with anatomic position and alignment.   Electronically Signed   By: Claudie Revering M.D.   On: 10/20/2014 10:31   Dg Hip Port Unilat With Pelvis 1v Left  10/20/2014   CLINICAL DATA:  Immediately postop ORIF of an intertrochanteric left femoral neck fracture.  EXAM: LEFT HIP (WITH PELVIS) 1 VIEW PORTABLE  COMPARISON:  Left hip x-rays with AP pelvis yesterday.  FINDINGS: Examination was performed portably. ORIF of the intertrochanteric left femoral neck fracture with placement of an intramedullary nail and compression screw. Anatomic alignment. No acute complicating features. Prior left total knee arthroplasty.  IMPRESSION: Anatomic alignment post ORIF of the intertrochanteric left femoral neck fracture without acute complicating features.   Electronically Signed   By: Evangeline Dakin M.D.   On: 10/20/2014 10:33   Dg Femur Min 2 Views Left  10/20/2014   CLINICAL DATA:  Left intertrochanteric fracture.  EXAM: DG C-ARM 61-120 MIN; LEFT FEMUR 2 VIEWS  TECHNIQUE: C-arm images of the  left femur were obtained in the operating room.  FLUOROSCOPY TIME:  Radiation Exposure Index (as provided by the fluoroscopic device): Not applicable  If the device does not provide the exposure index:  Fluoroscopy Time (in minutes and seconds):  0 min 49 seconds  Number of Acquired Images:  For  COMPARISON:  Left hip radiographs dated 10/19/2014.  FINDINGS: Intra medullary rod fixation of the previously demonstrated left intertrochanteric fracture with anatomic position and alignment of the fracture fragments. Left knee prosthesis. No new fractures or dislocations. Lower pelvic surgical clips.  IMPRESSION: Hardware fixation of the previously demonstrated left intertrochanteric fracture with anatomic position and alignment.   Electronically Signed   By: Claudie Revering M.D.   On: 10/20/2014 10:31    Objective  Filed Vitals:   10/21/14 1949 10/21/14 2129 10/21/14 2202 10/22/14 0311  BP: 131/74  129/82 108/86  Pulse: 91 86 89 62  Temp: 98.3 F (36.8 C)  97.9 F (36.6 C) 99.3 F (37.4 C)  TempSrc: Oral  Oral Oral  Resp: 18  18 18   SpO2:  94% 94% 90% 90%    Intake/Output Summary (Last 24 hours) at 10/22/14 0734 Last data filed at 10/22/14 5456  Gross per 24 hour  Intake    910 ml  Output    500 ml  Net    410 ml   There were no vitals filed for this visit.  Exam:  General:  NAD  HEENT: no scleral icterus, PERRL  Cardiovascular: irregularly irregular, no MRG, good pulses, no edema  Respiratory: CTA biL, good air movement, no wheezing, no crackles, no rales  Abdomen: soft, mild distention, non tender, decreased BS, no guarding  MSK/Extremities: no clubbing/cyanosis, no joint swelling  Skin: no rashes  Neuro: non focal, limited exam on hip repair side  Data Reviewed: Basic Metabolic Panel:  Recent Labs Lab 10/19/14 1526 10/21/14 0415 10/22/14 0408  NA 140 137 138  K 4.2 4.2 3.6  CL 106 104 104  CO2 22 24 24   GLUCOSE 201* 104* 133*  BUN 30* 18 21*  CREATININE 1.52*  1.55* 1.37*  CALCIUM 9.3 8.6* 9.0   CBC:  Recent Labs Lab 10/19/14 1526 10/21/14 0415 10/22/14 0408  WBC 10.9* 13.4* 14.4*  NEUTROABS 7.2  --   --   HGB 15.2 13.3 12.8*  HCT 44.4 39.5 37.2*  MCV 93.3 93.8 92.3  PLT 196 160 176    CBG:  Recent Labs Lab 10/21/14 1204 10/21/14 1640 10/21/14 2045 10/22/14 0010 10/22/14 0403  GLUCAP 147* 201* 175* 151* 140*    Recent Results (from the past 240 hour(s))  Urine culture     Status: None   Collection Time: 10/20/14  1:34 AM  Result Value Ref Range Status   Specimen Description URINE, CATHETERIZED  Final   Special Requests NONE  Final   Colony Count NO GROWTH Performed at [facility]   Final   Culture NO GROWTH Performed at [facility]   Final   Report Status 10/21/2014 FINAL  Final  MRSA PCR Screening     Status: None   Collection Time: 10/20/14  4:20 AM  Result Value Ref Range Status   MRSA by PCR NEGATIVE NEGATIVE Final    Comment:        The GeneXpert MRSA Assay (FDA approved for NASAL specimens only), is one component of a comprehensive MRSA colonization surveillance program. It is not intended to diagnose MRSA infection nor to guide or monitor treatment for MRSA infections.      Scheduled Meds: . allopurinol  300 mg Oral q morning - 10a  . carvedilol  12.5 mg Oral BID WC  . diltiazem  120 mg Oral BID  . docusate sodium  100 mg Oral BID  . ferrous sulfate  325 mg Oral TID PC  . insulin aspart  0-9 Units Subcutaneous 6 times per day  . insulin glargine  40 Units Subcutaneous QHS  . rivaroxaban  20 mg Oral Q supper  . rosuvastatin  10 mg Oral QPM  . senna  1 tablet Oral BID   Continuous Infusions: . sodium chloride 75 mL/hr (10/22/14 0015)   Marzetta Board, MD [facility] Pager [phone]. If 7 PM - 7 AM, please contact night-coverage at [email or web address], password [facility] 10/22/2014, 7:34 AM  LOS: 3 days

## 2014-10-22 NOTE — Progress Notes (Signed)
UR Completed. Mohanad Carsten, RN, BSN.  [phone] 

## 2014-10-22 NOTE — Progress Notes (Signed)
Patient transferred to 2W02, report given to receiving RN, Angelica Rosana Hoes.

## 2014-10-22 NOTE — Clinical Social Work Note (Signed)
Clinical Social Work Assessment  Patient Details  Name: Howard Charles MRN: [id number] Date of Birth: [birth year]  Date of referral:  10/22/14               Reason for consult:  Facility Placement                Permission sought to share information with:  Family Supports, Chartered certified accountant granted to share information::  Yes, Verbal Permission Granted  Name::     Psychiatric nurse  Agency::  [facility]  Relationship::  wife  Contact Information:     Housing/Transportation Living arrangements for the past 2 months:  Single Family Home Source of Information:  Patient Patient Interpreter Needed:  None Criminal Activity/Legal Involvement Pertinent to Current Situation/Hospitalization:  No - Comment as needed Significant Relationships:  Spouse Lives with:  Spouse Do you feel safe going back to the place where you live?  Yes Need for family participation in patient care:  No (Coment)  Care giving concerns:  Pt is unable to walk due to pain issues at this time and lives with his wife who is unable to provide adequate physical assistance.   Social Worker assessment / plan:  CSW discuss PT recommendation for SNF with pt at bedside  Employment status:  Retired Forensic scientist:    PT Recommendations:  [place] / Referral to community resources:  [place] [place]  Patient/Family's Response to care:  Pt is agreeable to SNF and realizes that he does not have enough physical support at home with his mobility issues at this time.  Pt states that he knows people who have been to [place] and to [place] and he is comfortable with either of those options.  Patient/Family's Understanding of and Emotional Response to Diagnosis, Current Treatment, and Prognosis:  Pt expresses good understanding of prognosis/ treatment plan.  Emotional Assessment Appearance:  Appears stated age, Well-Groomed Attitude/Demeanor/Rapport:    Affect  (typically observed):  Calm, Pleasant, Accepting Orientation:  Oriented to Self, Oriented to Place, Oriented to  Time, Oriented to Situation Alcohol / Substance use:  Not Applicable Psych involvement (Current and /or in the community):  No (Comment)  Discharge Needs  Concerns to be addressed:  Home Safety Concerns Readmission within the last 30 days:  No Current discharge risk:  Physical Impairment Barriers to Discharge:  Continued Medical Work up   [employer], LCSW 10/22/2014, 11:11 AM

## 2014-10-23 ENCOUNTER — Encounter (HOSPITAL_COMMUNITY): Payer: Self-pay | Admitting: Gastroenterology

## 2014-10-23 LAB — BASIC METABOLIC PANEL
ANION GAP: 9 (ref 5–15)
BUN: 36 mg/dL — ABNORMAL HIGH (ref 6–20)
CHLORIDE: 105 mmol/L (ref 101–111)
CO2: 21 mmol/L — ABNORMAL LOW (ref 22–32)
Calcium: 8.7 mg/dL — ABNORMAL LOW (ref 8.9–10.3)
Creatinine, Ser: 1.69 mg/dL — ABNORMAL HIGH (ref 0.61–1.24)
GFR calc Af Amer: 43 mL/min — ABNORMAL LOW (ref >60)
GFR calc non Af Amer: 37 mL/min — ABNORMAL LOW (ref >60)
Glucose, Bld: 109 mg/dL — ABNORMAL HIGH (ref 65–99)
POTASSIUM: 3.7 mmol/L (ref 3.5–5.1)
Sodium: 135 mmol/L (ref 135–145)

## 2014-10-23 LAB — GLUCOSE, CAPILLARY
GLUCOSE-CAPILLARY: 115 mg/dL — AB (ref 65–99)
GLUCOSE-CAPILLARY: 124 mg/dL — AB (ref 65–99)
GLUCOSE-CAPILLARY: 161 mg/dL — AB (ref 65–99)
Glucose-Capillary: 131 mg/dL — ABNORMAL HIGH (ref 65–99)
Glucose-Capillary: 192 mg/dL — ABNORMAL HIGH (ref 65–99)

## 2014-10-23 LAB — HEMOGLOBIN A1C
HEMOGLOBIN A1C: 7.7 % — AB (ref 4.8–5.6)
Mean Plasma Glucose: 174 mg/dL

## 2014-10-23 LAB — CBC
HCT: 37.3 % — ABNORMAL LOW (ref 39.0–52.0)
Hemoglobin: 12.3 g/dL — ABNORMAL LOW (ref 13.0–17.0)
MCH: 30.8 pg (ref 26.0–34.0)
MCHC: 33 g/dL (ref 30.0–36.0)
MCV: 93.3 fL (ref 78.0–100.0)
PLATELETS: 203 10*3/uL (ref 150–400)
RBC: 4 MIL/uL — ABNORMAL LOW (ref 4.22–5.81)
RDW: 14.5 % (ref 11.5–15.5)
WBC: 17.2 10*3/uL — ABNORMAL HIGH (ref 4.0–10.5)

## 2014-10-23 LAB — APTT: aPTT: 37 seconds (ref 24–37)

## 2014-10-23 LAB — HEPARIN LEVEL (UNFRACTIONATED): Heparin Unfractionated: >2.2 IU/mL — ABNORMAL HIGH (ref 0.30–0.70)

## 2014-10-23 MED ORDER — POTASSIUM CHLORIDE 10 MEQ/100ML IV SOLN
10.0000 meq | INTRAVENOUS | Status: AC
  Administered 2014-10-23 (×2): 10 meq via INTRAVENOUS
  Filled 2014-10-23 (×2): qty 100

## 2014-10-23 MED ORDER — DILTIAZEM HCL 100 MG IV SOLR
10.0000 mg/h | INTRAVENOUS | Status: DC
  Administered 2014-10-26 – 2014-10-28 (×2): 5 mg/h via INTRAVENOUS
  Administered 2014-10-30 – 2014-11-03 (×10): 10 mg/h via INTRAVENOUS
  Filled 2014-10-23 (×18): qty 100

## 2014-10-23 MED ORDER — MORPHINE SULFATE 2 MG/ML IJ SOLN: 1.0000 mg | INTRAMUSCULAR | Status: DC | PRN

## 2014-10-23 MED ORDER — ZOLPIDEM TARTRATE 5 MG PO TABS
5.0000 mg | ORAL_TABLET | Freq: Once | ORAL | Status: AC
  Administered 2014-10-23: 5 mg via ORAL
  Filled 2014-10-23: qty 1

## 2014-10-23 MED ORDER — POLYETHYLENE GLYCOL 3350 17 G PO PACK
17.0000 g | PACK | Freq: Four times a day (QID) | ORAL | Status: DC
  Administered 2014-10-23 – 2014-11-03 (×27): 17 g via ORAL
  Filled 2014-10-23 (×54): qty 1

## 2014-10-23 MED ORDER — METOCLOPRAMIDE HCL 5 MG/ML IJ SOLN
5.0000 mg | Freq: Three times a day (TID) | INTRAMUSCULAR | Status: DC
  Administered 2014-10-23 – 2014-10-24 (×3): 5 mg via INTRAVENOUS
  Filled 2014-10-23 (×6): qty 1

## 2014-10-23 MED ORDER — HEPARIN (PORCINE) IN NACL 100-0.45 UNIT/ML-% IJ SOLN
1300.0000 [IU]/h | INTRAMUSCULAR | Status: DC
  Administered 2014-10-23: 1300 [IU]/h via INTRAVENOUS
  Filled 2014-10-23: qty 250

## 2014-10-23 MED ORDER — METOPROLOL TARTRATE 1 MG/ML IV SOLN
5.0000 mg | Freq: Four times a day (QID) | INTRAVENOUS | Status: DC
  Administered 2014-10-23 – 2014-10-28 (×19): 5 mg via INTRAVENOUS
  Filled 2014-10-23 (×24): qty 5

## 2014-10-23 MED ORDER — SODIUM CHLORIDE 0.9 % IV SOLN
INTRAVENOUS | Status: DC
  Administered 2014-10-23 – 2014-10-24 (×2): via INTRAVENOUS

## 2014-10-23 MED ORDER — HYDROCODONE-ACETAMINOPHEN 5-325 MG PO TABS
1.0000 | ORAL_TABLET | Freq: Three times a day (TID) | ORAL | Status: DC | PRN
  Administered 2014-10-30 (×2): 2 via ORAL
  Filled 2014-10-23 (×2): qty 2

## 2014-10-23 MED ORDER — FLUCONAZOLE 100MG IVPB
100.0000 mg | INTRAVENOUS | Status: DC
  Administered 2014-10-23 – 2014-10-31 (×9): 100 mg via INTRAVENOUS
  Filled 2014-10-23 (×14): qty 50

## 2014-10-23 NOTE — Care Management Note (Signed)
Case Management Note  Patient Details  Name: Howard Charles MRN: [id number] Date of Birth: 02-Mar-1936  Subjective/Objective:  Pt admitted s/p fall with hip fx now with repair and post op ileus                   Action/Plan: PTA pt lived at home- plan for [facility]NF for rehab- CSW following  Expected Discharge Date:                  Expected Discharge Plan:  Skilled Nursing Facility  In-House Referral:  Clinical Social Work  Discharge planning Services  CM Consult  Post Acute Care Choice:    Choice offered to:     DME Arranged:    DME Agency:     HH Arranged:    [place] Agency:     Status of Service:  In process, will continue to follow  Medicare Important Message Given:  Yes Date Medicare IM Given:  10/23/14 Medicare IM give by:  Marvetta Gibbons Date Additional Medicare IM Given:    Additional Medicare Important Message give by:     If discussed at [place] of Stay Meetings, dates discussed:  10/24/14  Additional Comments:  Dawayne Patricia, RN 10/23/2014, 9:20 PM

## 2014-10-23 NOTE — Progress Notes (Signed)
[facility] PROGRESS NOTE Interim History: 79 y.o. male   has a past medical history of Diabetes mellitus type 2 in obese; Diabetic neuropathy; Hypertension; GERD  Gout; Left knee DJD; Right knee DJD; PONV  Bilateral hearing loss; Chronic renal insufficiency, stage III (moderate); Dysrhythmia; and On supplemental oxygen therapy. Presented with fall while at the grocery store, Patient lost balance and fell hitting his HEAD and left hip he was not able to get up. EMS was called and he arrived to ER. Plain imaging showed Intertochanteric fracture of the left hip. Patient with hx of A.fib on Xarelto last dose this AM and coreg. On arrival to ER he was tachycardic with HR up to 130 with A.fib he was given a dose of diltiazem and improved with HR down to 80's currently converted to sinus rhythm with heart rate of 70    Assessment/Plan: Ileus, postoperative - Patient with progressive nausea and vomiting with abdominal distention. - I have personally reviewed abdominal x-ray showed ileus with dilated cecum > 10 cm - Place NG tube and rectal tube to help decompress colon. - may have ice chips.  Atrial fibrillation with RVR: - Cardiology was consulted he was started on IV diltiazem, now in sinus rhythm. Cont IV diltiazem. - As the patient is currently nothing by mouth we'll switch him to IV heparin.  Left closed hip fracture: - Orthopedic surgery was consulted and into her trochanteric IM nailing performed on 10/18/2014. - Physical therapy was consulted which recommended skilled nursing facility.  Alzheimer disease: Very subtle.  Acute on Chronic kidney see stage III: - Most likely prerenal due to decreased oral intake, will start IV fluids strict I's and o's place Foley.  Prostate cancer  Diabetes mellitus type 2 in obese: - cont Lantus plus SSI.  Essential  Hypertension - Bp stable.  Code Status: Full Code Family Communication: d/w patient Disposition Plan: remain  inpatient   Consultants:  GI  Procedures:  abd x-ray  Antibiotics:  None  HPI/Subjective: Patient relates he is not nauseated and has some abdominal discomfort.  Objective: Filed Vitals:   10/22/14 0823 10/22/14 1354 10/22/14 2007 10/23/14 0503  BP: 118/74 120/76 130/65 132/71  Pulse: 128 90 87 77  Temp:  99 F (37.2 C) 98 F (36.7 C) 98.5 F (36.9 C)  TempSrc:  Axillary Oral Oral  Resp:  18 16 18   SpO2:  92% 89% 94%    Intake/Output Summary (Last 24 hours) at 10/23/14 1033 Last data filed at 10/23/14 0300  Gross per 24 hour  Intake    240 ml  Output    275 ml  Net    -35 ml   There were no vitals filed for this visit.  Exam:  General: Alert, awake, oriented x3, in no acute distress.  HEENT: No bruits, no goiter.  Heart: Regular rate and rhythm. Lungs: Good air movement, bilateral air movement.  Abdomen: Soft, mild diffuse tender, distended, positive bowel sounds.  Neuro: Grossly intact, nonfocal.   Data Reviewed: Basic Metabolic Panel:  Recent Labs Lab 10/19/14 1526 10/21/14 0415 10/22/14 0408 10/23/14 0529  NA 140 137 138 135  K 4.2 4.2 3.6 3.7  CL 106 104 104 105  CO2 22 24 24  21*  GLUCOSE 201* 104* 133* 109*  BUN 30* 18 21* 36*  CREATININE 1.52* 1.55* 1.37* 1.69*  CALCIUM 9.3 8.6* 9.0 8.7*   Liver Function Tests: No results for input(s): AST, ALT, ALKPHOS, BILITOT, PROT, ALBUMIN in the last 168 hours.  No results for input(s): LIPASE, AMYLASE in the last 168 hours. No results for input(s): AMMONIA in the last 168 hours. CBC:  Recent Labs Lab 10/19/14 1526 10/21/14 0415 10/22/14 0408 10/23/14 0529  WBC 10.9* 13.4* 14.4* 17.2*  NEUTROABS 7.2  --   --   --   HGB 15.2 13.3 12.8* 12.3*  HCT 44.4 39.5 37.2* 37.3*  MCV 93.3 93.8 92.3 93.3  PLT 196 160 176 203   Cardiac Enzymes: No results for input(s): CKTOTAL, CKMB, CKMBINDEX, TROPONINI in the last 168 hours. BNP (last 3 results) No results for input(s): BNP in the last 8760  hours.  ProBNP (last 3 results) No results for input(s): PROBNP in the last 8760 hours.  CBG:  Recent Labs Lab 10/22/14 1107 10/22/14 1601 10/22/14 2005 10/23/14 0035 10/23/14 0502  GLUCAP 170* 128* 107* 115* 131*    Recent Results (from the past 240 hour(s))  Urine culture     Status: None   Collection Time: 10/20/14  1:34 AM  Result Value Ref Range Status   Specimen Description URINE, CATHETERIZED  Final   Special Requests NONE  Final   Colony Count NO GROWTH Performed at [facility]   Final   Culture NO GROWTH Performed at [facility]   Final   Report Status 10/21/2014 FINAL  Final  MRSA PCR Screening     Status: None   Collection Time: 10/20/14  4:20 AM  Result Value Ref Range Status   MRSA by PCR NEGATIVE NEGATIVE Final    Comment:        The GeneXpert MRSA Assay (FDA approved for NASAL specimens only), is one component of a comprehensive MRSA colonization surveillance program. It is not intended to diagnose MRSA infection nor to guide or monitor treatment for MRSA infections.      Studies: Dg Abd Portable 1v  10/22/2014   CLINICAL DATA:  Ileus, followup  EXAM: PORTABLE ABDOMEN - 1 VIEW  COMPARISON:  Abdomen film of 10/21/2014  FINDINGS: There is little change in gaseous distention of both large and small bowel. This pattern is most consistent with ileus. The cecum currently measures 12 cm in diameter, and continued followup is recommended. Multiple surgical clips are noted within the pelvis and prior left hip pinning is present.  IMPRESSION: Persistent ileus pattern.  Recommend continued follow-up.   Electronically Signed   By: Ivar Drape M.D.   On: 10/22/2014 08:09    Scheduled Meds: . allopurinol  300 mg Oral q morning - 10a  . docusate sodium  100 mg Oral BID  . ferrous sulfate  325 mg Oral TID PC  . insulin aspart  0-9 Units Subcutaneous 6 times per day  . insulin glargine  40 Units Subcutaneous QHS  . metoCLOPramide (REGLAN)  injection  10 mg Intravenous 4 times per day  . metoprolol  5 mg Intravenous 4 times per day  . rosuvastatin  10 mg Oral QPM  . senna  1 tablet Oral BID   Continuous Infusions: . sodium chloride 125 mL/hr at 10/23/14 1012  . diltiazem (CARDIZEM) infusion      Time Spent: 25 min   Charlynne Cousins  [facility] Pager [phone]. If 7PM-7AM, please contact night-coverage at [email or web address], password [facility] 10/23/2014, 10:33 AM  LOS: 4 days

## 2014-10-23 NOTE — Consult Note (Signed)
Reason for Consult: Postop ileus Referring Physician: Hospital team and orthopedic team  Howard Charles is an 79 y.o. male.  HPI: Patient known to my partner Dr. Paulita Fujita with a few negative colonoscopies except for polyps the last one about a year ago and he has not had any bowel problems and had a normal bowel movement on Saturday prior to his surgery on Sunday and he has not had ileus after previous surgeries however we are consulted for an ileus and his office computer chart and the hospital computer chart was reviewed as well as his labs and x-rays and his case was discussed with the patient and his wife as well as the hospital team  Past Medical History  Diagnosis Date  . Diabetes mellitus type 2 in obese   . Diabetic neuropathy   . Hypertension   . GERD (gastroesophageal reflux disease)   . Gout   . Left knee DJD   . Right knee DJD   . PONV (postoperative nausea and vomiting)     H/O ....BUT LAST FEW TIMES---NONE  . Bilateral hearing loss     WEARS HEARING AIDS........RIGHT IS BETTER THEN LEFT  . Chronic renal insufficiency, stage III (moderate)     Creatinine 1.47  . Dysrhythmia     h/o A Fib  . On supplemental oxygen therapy     at night  . Ileus, postoperative 10/22/2014    Past Surgical History  Procedure Laterality Date  . Knee arthroscopy  2008    left   . Knee arthroscopy  2012    left  . Knee arthroscopy  2002    right  . Hemorrhoid surgery    . Inner ear surgery    . Shoulder arthroscopy with rotator cuff repair  2007  . Suprapubic prostatectomy      21  YRS  AGO  . Cholecystectomy    . Tonsillectomy    . Total knee arthroplasty  06/26/2012    Procedure: TOTAL KNEE ARTHROPLASTY;  Surgeon: Lorn Junes, MD;  Location: [place];  Service: Orthopedics;  Laterality: Left;  . Colonoscopy    . Total knee arthroplasty Right 05/21/2013    Procedure: TOTAL KNEE ARTHROPLASTY;  Surgeon: Lorn Junes, MD;  Location: [place];  Service: Orthopedics;  Laterality: Right;   . Intramedullary (im) nail intertrochanteric Left 10/20/2014  . Intramedullary (im) nail intertrochanteric Left 10/20/2014    Procedure: INTRAMEDULLARY (IM) NAIL INTERTROCHANTRIC;  Surgeon: [name], MD;  Location: [place];  Service: Orthopedics;  Laterality: Left;    Family History  Problem Relation Age of Onset  . Hypertension Mother   . Diabetes Mother   . Heart disease Mother   . Heart disease Father   . Heart attack Father   . Hypertension Father   . Diabetes Mellitus II Father     Social History:  reports that he has never smoked. He has never used smokeless tobacco. He reports that he does not drink alcohol or use illicit drugs.  Allergies: No Known Allergies  Medications: I have reviewed the patient's current medications.  Results for orders placed or performed during the hospital encounter of 10/19/14 (from the past 48 hour(s))  Glucose, capillary     Status: Abnormal   Collection Time: 10/21/14  8:45 PM  Result Value Ref Range   Glucose-Capillary 175 (H) 65 - 99 mg/dL  Glucose, capillary     Status: Abnormal   Collection Time: 10/22/14 12:10 AM  Result Value Ref Range   Glucose-Capillary  151 (H) 65 - 99 mg/dL   Comment 1 Notify RN   Glucose, capillary     Status: Abnormal   Collection Time: 10/22/14  4:03 AM  Result Value Ref Range   Glucose-Capillary 140 (H) 65 - 99 mg/dL   Comment 1 Notify RN   CBC     Status: Abnormal   Collection Time: 10/22/14  4:08 AM  Result Value Ref Range   WBC 14.4 (H) 4.0 - 10.5 K/uL   RBC 4.03 (L) 4.22 - 5.81 MIL/uL   Hemoglobin 12.8 (L) 13.0 - 17.0 g/dL   HCT 37.2 (L) 39.0 - 52.0 %   MCV 92.3 78.0 - 100.0 fL   MCH 31.8 26.0 - 34.0 pg   MCHC 34.4 30.0 - 36.0 g/dL   RDW 14.3 11.5 - 15.5 %   Platelets 176 150 - 400 K/uL  Basic metabolic panel     Status: Abnormal   Collection Time: 10/22/14  4:08 AM  Result Value Ref Range   Sodium 138 135 - 145 mmol/L   Potassium 3.6 3.5 - 5.1 mmol/L   Chloride 104 101 - 111 mmol/L   CO2  24 22 - 32 mmol/L   Glucose, Bld 133 (H) 65 - 99 mg/dL   BUN 21 (H) 6 - 20 mg/dL   Creatinine, Ser 1.37 (H) 0.61 - 1.24 mg/dL   Calcium 9.0 8.9 - 10.3 mg/dL   GFR calc non Af Amer 48 (L) >60 mL/min   GFR calc Af Amer 56 (L) >60 mL/min    Comment: (NOTE) The eGFR has been calculated using the CKD EPI equation. This calculation has not been validated in all clinical situations. eGFR's persistently <60 mL/min signify possible Chronic Kidney Disease.    Anion gap 10 5 - 15  Hemoglobin A1c     Status: Abnormal   Collection Time: 10/22/14  4:08 AM  Result Value Ref Range   Hgb A1c MFr Bld 7.7 (H) 4.8 - 5.6 %    Comment: (NOTE)         Pre-diabetes: 5.7 - 6.4         Diabetes: >6.4         Glycemic control for adults with diabetes: <7.0    Mean Plasma Glucose 174 mg/dL    Comment: (NOTE) Performed At: [facility] [address] [place], [place] [id number] Lindon Romp MD BC:[id number]   Glucose, capillary     Status: Abnormal   Collection Time: 10/22/14  7:57 AM  Result Value Ref Range   Glucose-Capillary 122 (H) 65 - 99 mg/dL  Glucose, capillary     Status: Abnormal   Collection Time: 10/22/14 11:07 AM  Result Value Ref Range   Glucose-Capillary 170 (H) 65 - 99 mg/dL  Glucose, capillary     Status: Abnormal   Collection Time: 10/22/14  4:01 PM  Result Value Ref Range   Glucose-Capillary 128 (H) 65 - 99 mg/dL  Glucose, capillary     Status: Abnormal   Collection Time: 10/22/14  8:05 PM  Result Value Ref Range   Glucose-Capillary 107 (H) 65 - 99 mg/dL  Glucose, capillary     Status: Abnormal   Collection Time: 10/23/14 12:35 AM  Result Value Ref Range   Glucose-Capillary 115 (H) 65 - 99 mg/dL   Comment 1 Notify RN   Glucose, capillary     Status: Abnormal   Collection Time: 10/23/14  5:02 AM  Result Value Ref Range   Glucose-Capillary 131 (H) 65 -  99 mg/dL   Comment 1 Notify RN   CBC     Status: Abnormal   Collection Time: 10/23/14  5:29 AM  Result Value  Ref Range   WBC 17.2 (H) 4.0 - 10.5 K/uL   RBC 4.00 (L) 4.22 - 5.81 MIL/uL   Hemoglobin 12.3 (L) 13.0 - 17.0 g/dL   HCT 37.3 (L) 39.0 - 52.0 %   MCV 93.3 78.0 - 100.0 fL   MCH 30.8 26.0 - 34.0 pg   MCHC 33.0 30.0 - 36.0 g/dL   RDW 14.5 11.5 - 15.5 %   Platelets 203 150 - 400 K/uL  Basic metabolic panel     Status: Abnormal   Collection Time: 10/23/14  5:29 AM  Result Value Ref Range   Sodium 135 135 - 145 mmol/L   Potassium 3.7 3.5 - 5.1 mmol/L   Chloride 105 101 - 111 mmol/L   CO2 21 (L) 22 - 32 mmol/L   Glucose, Bld 109 (H) 65 - 99 mg/dL   BUN 36 (H) 6 - 20 mg/dL   Creatinine, Ser 1.69 (H) 0.61 - 1.24 mg/dL   Calcium 8.7 (L) 8.9 - 10.3 mg/dL   GFR calc non Af Amer 37 (L) >60 mL/min   GFR calc Af Amer 43 (L) >60 mL/min    Comment: (NOTE) The eGFR has been calculated using the CKD EPI equation. This calculation has not been validated in all clinical situations. eGFR's persistently <60 mL/min signify possible Chronic Kidney Disease.    Anion gap 9 5 - 15  Glucose, capillary     Status: Abnormal   Collection Time: 10/23/14 11:07 AM  Result Value Ref Range   Glucose-Capillary 124 (H) 65 - 99 mg/dL  APTT     Status: None   Collection Time: 10/23/14 11:25 AM  Result Value Ref Range   aPTT 37 24 - 37 seconds    Comment:        IF BASELINE aPTT IS ELEVATED, SUGGEST PATIENT RISK ASSESSMENT BE USED TO DETERMINE APPROPRIATE ANTICOAGULANT THERAPY.   Heparin level (unfractionated)     Status: Abnormal   Collection Time: 10/23/14 11:25 AM  Result Value Ref Range   Heparin Unfractionated >2.20 (H) 0.30 - 0.70 IU/mL    Comment:        IF HEPARIN RESULTS ARE BELOW EXPECTED VALUES, AND PATIENT DOSAGE HAS BEEN CONFIRMED, SUGGEST FOLLOW UP TESTING OF ANTITHROMBIN III LEVELS. RESULTS CONFIRMED BY MANUAL DILUTION   Glucose, capillary     Status: Abnormal   Collection Time: 10/23/14  4:13 PM  Result Value Ref Range   Glucose-Capillary 161 (H) 65 - 99 mg/dL    Dg Abd Portable  1v  10/22/2014   CLINICAL DATA:  Ileus, followup  EXAM: PORTABLE ABDOMEN - 1 VIEW  COMPARISON:  Abdomen film of 10/21/2014  FINDINGS: There is little change in gaseous distention of both large and small bowel. This pattern is most consistent with ileus. The cecum currently measures 12 cm in diameter, and continued followup is recommended. Multiple surgical clips are noted within the pelvis and prior left hip pinning is present.  IMPRESSION: Persistent ileus pattern.  Recommend continued follow-up.   Electronically Signed   By: Ivar Drape M.D.   On: 10/22/2014 08:09    ROS he says he is only in mild pain but has not yet started physical therapy Blood pressure 111/61, pulse 97, temperature 99.3 F (37.4 C), temperature source Oral, resp. rate 18, SpO2 95 %. Physical Exam vital signs  stable afebrile no acute distress looks way better than his story sounds exam pertinent for obvious abdominal distention with tympany but occasional bowel sounds and very minimal discomfort and soft overall x-ray compatible with ileus cecum at 11 cm white count increased K3.7  Assessment/Plan: Postop ileus Plan: We will try to keep potassium greater than 4 and check magnesium and phosphorus as well as other labs tomorrow and increase them as needed and will cut back on his pain medicine and feel free to do it more tomorrow and in the meantime we'll use Reglan as a motility agent but consider changing him to erythromycin next and we will go ahead and give MiraLAX via NG 4 times a day and clamp NG for 2 hours afterwards and agree with x-ray in the a.m. and consider decompression when necessary and will follow with you  Angline Schweigert E 10/23/2014, 5:15 PM

## 2014-10-23 NOTE — Progress Notes (Addendum)
ANTICOAGULATION CONSULT NOTE - Initial Consult  Pharmacy Consult for heparin Indication: atrial fibrillation  No Known Allergies  Patient Measurements: Wt= 101.6kg Ht= 5' 10'' IBW= 73kg Heparin dosing wt: 94kg  Vital Signs: Temp: 98.5 F (36.9 C) (05/25 0503) Temp Source: Oral (05/25 0503) BP: 132/71 mmHg (05/25 0503) Pulse Rate: 77 (05/25 0503)  Labs:  Recent Labs  10/21/14 0415 10/22/14 0408 10/23/14 0529  HGB 13.3 12.8* 12.3*  HCT 39.5 37.2* 37.3*  PLT 160 176 203  CREATININE 1.55* 1.37* 1.69*    Estimated Creatinine Clearance: 43.7 mL/min (by C-G formula based on Cr of 1.69).   Medical History: Past Medical History  Diagnosis Date  . Diabetes mellitus type 2 in obese   . Diabetic neuropathy   . Hypertension   . GERD (gastroesophageal reflux disease)   . Gout   . Left knee DJD   . Right knee DJD   . PONV (postoperative nausea and vomiting)     H/O ....BUT LAST FEW TIMES---NONE  . Bilateral hearing loss     WEARS HEARING AIDS........RIGHT IS BETTER THEN LEFT  . Chronic renal insufficiency, stage III (moderate)     Creatinine 1.47  . Dysrhythmia     h/o A Fib  . On supplemental oxygen therapy     at night  . Ileus, postoperative 10/22/2014    Medications:  Scheduled:  . allopurinol  300 mg Oral q morning - 10a  . docusate sodium  100 mg Oral BID  . ferrous sulfate  325 mg Oral TID PC  . insulin aspart  0-9 Units Subcutaneous 6 times per day  . insulin glargine  40 Units Subcutaneous QHS  . metoCLOPramide (REGLAN) injection  10 mg Intravenous 4 times per day  . metoprolol  5 mg Intravenous 4 times per day  . rosuvastatin  10 mg Oral QPM  . senna  1 tablet Oral BID   Infusions:  . sodium chloride 75 mL/hr at 10/23/14 0301  . diltiazem (CARDIZEM) infusion      Assessment: 79 yo male with afib on xarelto. He is s/p fall with hip fracture and repair on 5/22 and and in now noted with post-op ileus. There is concern for absorption of po Xarelto  and this was discussed with Dr. Olevia Bowens. Pharmacy to begin IV heparin. Last dose of xarelto was  5/24 at about 4pm.   Goal of Therapy:  Heparin level 0.3-0.7 units/ml aPTT 66-102 seconds Monitor platelets by anticoagulation protocol: Yes   Plan:  -Will obtain a baseline heparin level and aPTT -Will plan to start heparin at 1300 units/hr at 4pm  -APTT in 8 hrs -Daily heparin level and aPTT  Hildred Laser, Pharm D 10/23/2014 10:06 AM

## 2014-10-23 NOTE — Progress Notes (Signed)
Flexiseal placed and small amount of stool noted. Cato Mulligan RN

## 2014-10-23 NOTE — Progress Notes (Signed)
Per MD, may hold cardizem gtt as long as pt is asymptamatic with heart rate. Cato Mulligan RN

## 2014-10-23 NOTE — Progress Notes (Signed)
Subjective:  Experiencing significant abd discomfort from post operative ileus. Monitor reveals a.fib with only brief episodes of NSR with frequent PACs.  Denies any chest pain, SOB, or palpitations.  Objective:  Vital Signs in the last 24 hours: Temp:  [98 F (36.7 C)-99 F (37.2 C)] 98.5 F (36.9 C) (05/25 0503) Pulse Rate:  [77-128] 77 (05/25 0503) Resp:  [16-18] 18 (05/25 0503) BP: (118-132)/(65-76) 132/71 mmHg (05/25 0503) SpO2:  [89 %-94 %] 94 % (05/25 0503)  Intake/Output from previous day: 05/24 0701 - 05/25 0700 In: 360 [P.O.:360] Out: 275 [Urine:275]  Physical Exam:   General appearance: alert, cooperative, appears stated age and moderately obese Neck: no adenopathy, no carotid bruit, no JVD, supple, symmetrical, trachea midline and thyroid not enlarged, symmetric, no tenderness/mass/nodules Resp: clear to auscultation bilaterally Chest wall: no tenderness Cardio: irregularly irregular rhythm, S1: variable, S2: normal, no S3 or S4 and no murmur GI: abnormal findings:  distended, hypoactive bowel sounds and obese Extremities: extremities normal, atraumatic, no cyanosis or edema    Lab Results: BMP  Recent Labs  10/21/14 0415 10/22/14 0408 10/23/14 0529  NA 137 138 135  K 4.2 3.6 3.7  CL 104 104 105  CO2 24 24 21*  GLUCOSE 104* 133* 109*  BUN 18 21* 36*  CREATININE 1.55* 1.37* 1.69*  CALCIUM 8.6* 9.0 8.7*  GFRNONAA 42* 48* 37*  GFRAA 48* 56* 43*    CBC  Recent Labs Lab 10/19/14 1526  10/23/14 0529  WBC 10.9*  < > 17.2*  RBC 4.76  < > 4.00*  HGB 15.2  < > 12.3*  HCT 44.4  < > 37.3*  PLT 196  < > 203  MCV 93.3  < > 93.3  MCH 31.9  < > 30.8  MCHC 34.2  < > 33.0  RDW 14.3  < > 14.5  LYMPHSABS 2.1  --   --   MONOABS 1.0  --   --   EOSABS 0.6  --   --   BASOSABS 0.1  --   --   < > = values in this interval not displayed.  HEMOGLOBIN A1C Lab Results  Component Value Date   HGBA1C 7.7* 10/22/2014   MPG 174 10/22/2014    Cardiac Panel  (last 3 results) No results for input(s): CKTOTAL, CKMB, TROPONINI, RELINDX in the last 8760 hours.  BNP (last 3 results) No results for input(s): PROBNP in the last 8760 hours.  TSH No results for input(s): TSH in the last 8760 hours.  CHOLESTEROL No results for input(s): CHOL in the last 8760 hours.  Hepatic Function Panel No results for input(s): PROT, ALBUMIN, AST, ALT, ALKPHOS, BILITOT, BILIDIR, IBILI in the last 8760 hours.  Imaging: Dg Abd Portable 1v  10/22/2014   CLINICAL DATA:  Ileus, followup  EXAM: PORTABLE ABDOMEN - 1 VIEW  COMPARISON:  Abdomen film of 10/21/2014  FINDINGS: There is little change in gaseous distention of both large and small bowel. This pattern is most consistent with ileus. The cecum currently measures 12 cm in diameter, and continued followup is recommended. Multiple surgical clips are noted within the pelvis and prior left hip pinning is present.  IMPRESSION: Persistent ileus pattern.  Recommend continued follow-up.   Electronically Signed   By: Ivar Drape M.D.   On: 10/22/2014 08:09    Cardiac Studies: EKG monitor on 10/23/2014 @ 0745 reveals a.fib with ventricular rate in the 120s.  EKG 10/19/2014 @ 1804: A. fib with RVR at a ventricular rate  of 132 bpm  EKG 10/19/2014 @ 0312: NSR at a rate of 84, LAE, no evidence of ischemia   Assessment/Plan:  1. Paroxysmal a.fib: CHA2DS2-VASCScore: Risk Score 4, Yearly risk of stroke 4.0%. Recommendation: Anticoagulation: Xarelto; [place] Has Bled: Score 2. Estimated risk of major bleeding at 1 year with [place] 1.88-3.2% 2. HTN 3. DM with CKD III: creatinine has increased slightly to 1.69. CrCl now 45 ml/min 4. HLD 5. Post-op ileus  Recommendation: Pt primarily in a.fib with ventricular rate in the 120s and occasionally higher with movement and periods of increased pain.  D/C PO cardizem and coreg. Changed to IV metoprolol and Cardizem gtt.  Change Xarelto to IV Heparin in order to maintain complete NPO status due  to post-op ileus. Xarelto dose will need to be reevaluated on discharge if CrCl does not improve.  Rachel Bo, NP-C 10/23/2014, 7:55 AM [facility] Pager: [phone] Office: [phone]

## 2014-10-23 NOTE — Progress Notes (Addendum)
Patient ID: Howard Charles, male   DOB: Apr 16, 1936, 79 y.o.   MRN: [id number]     Subjective:  Patient reports pain as mild to moderate.  Patient having mild pain in the hip and moderate pain in the abdomin.  Lying in be in moderated distress from abdominal pain  Objective:   VITALS:   Filed Vitals:   10/22/14 0823 10/22/14 1354 10/22/14 2007 10/23/14 0503  BP: 118/74 120/76 130/65 132/71  Pulse: 128 90 87 77  Temp:  99 F (37.2 C) 98 F (36.7 C) 98.5 F (36.9 C)  TempSrc:  Axillary Oral Oral  Resp:  18 16 18   SpO2:  92% 89% 94%    Sensation intact distally Dorsiflexion/Plantar flexion intact Incision: dressing C/D/I and scant drainage Abdomin distended mild pain to palpation No Flatus  Lab Results  Component Value Date   WBC 17.2* 10/23/2014   HGB 12.3* 10/23/2014   HCT 37.3* 10/23/2014   MCV 93.3 10/23/2014   PLT 203 10/23/2014   BMET    Component Value Date/Time   NA 135 10/23/2014 0529   K 3.7 10/23/2014 0529   CL 105 10/23/2014 0529   CO2 21* 10/23/2014 0529   GLUCOSE 109* 10/23/2014 0529   BUN 36* 10/23/2014 0529   CREATININE 1.69* 10/23/2014 0529   CALCIUM 8.7* 10/23/2014 0529   GFRNONAA 37* 10/23/2014 0529   GFRAA 43* 10/23/2014 0529     Assessment/Plan: 3 Days Post-Op   Principal Problem:   Closed left hip fracture Active Problems:   Diabetes mellitus type 2 in obese   Hypertension   Chronic renal insufficiency, stage III (moderate)   A-fib   Prostate cancer   Atrial fibrillation with RVR   Ileus, postoperative   Up with therapy Continue NPO water with meds Continue plan per medicine WBAT Post-op ileus continue bowel regimen     DOUGLAS PARRY, BRANDON 10/23/2014, 7:17 AM  Patient seen and agree with above. I will also order an NG tube, and a gastroenterology consult. He is a patient of Dr. Erlinda Hong, with [facility] gastroenterology, and I have called to consult.  His renal insufficiency is also worsened, and he may need increased IV  fluids, I will increase his fluids from 75-125, and defer to the primary hospitalist team.  Elevated white count likely secondary to ileus.  [name], MD Cell [phone]    ADDENDUM:  PATIENT DOES NOT HAVE ALZHEIMER'S.  INCORRECTLY PULLED FORWARD FROM HISTORICAL INACCURATE NOTE.  THIS NOTE HAS BEEN CORRECTED.

## 2014-10-23 NOTE — Progress Notes (Signed)
NG tube placed, pt tolerated well, connected to LWS with brown liquid output. Cato Mulligan RN

## 2014-10-24 ENCOUNTER — Inpatient Hospital Stay (HOSPITAL_COMMUNITY): Payer: Medicare Other

## 2014-10-24 ENCOUNTER — Encounter (HOSPITAL_COMMUNITY)
Admission: EM | Disposition: A | Discharge status: Skilled Nursing Facility | Payer: Medicare Other | Source: Home / Self Care | Attending: Internal Medicine

## 2014-10-24 ENCOUNTER — Encounter (HOSPITAL_COMMUNITY): Payer: Self-pay

## 2014-10-24 HISTORY — PX: BOWEL DECOMPRESSION: SHX5532

## 2014-10-24 HISTORY — PX: COLONOSCOPY: SHX5424

## 2014-10-24 LAB — CBC WITH DIFFERENTIAL/PLATELET
Basophils Absolute: 0 10*3/uL (ref 0.0–0.1)
Basophils Relative: 0 % (ref 0–1)
EOS PCT: 0 % (ref 0–5)
Eosinophils Absolute: 0 10*3/uL (ref 0.0–0.7)
HEMATOCRIT: 34.8 % — AB (ref 39.0–52.0)
HEMOGLOBIN: 11.9 g/dL — AB (ref 13.0–17.0)
LYMPHS ABS: 1.6 10*3/uL (ref 0.7–4.0)
Lymphocytes Relative: 9 % — ABNORMAL LOW (ref 12–46)
MCH: 31.6 pg (ref 26.0–34.0)
MCHC: 34.2 g/dL (ref 30.0–36.0)
MCV: 92.3 fL (ref 78.0–100.0)
MONO ABS: 1.3 10*3/uL — AB (ref 0.1–1.0)
Monocytes Relative: 7 % (ref 3–12)
NEUTROS ABS: 16.2 10*3/uL — AB (ref 1.7–7.7)
Neutrophils Relative %: 84 % — ABNORMAL HIGH (ref 43–77)
Platelets: 237 10*3/uL (ref 150–400)
RBC: 3.77 MIL/uL — AB (ref 4.22–5.81)
RDW: 14.6 % (ref 11.5–15.5)
WBC: 19.1 10*3/uL — ABNORMAL HIGH (ref 4.0–10.5)

## 2014-10-24 LAB — CBC
HCT: 37.9 % — ABNORMAL LOW (ref 39.0–52.0)
HEMOGLOBIN: 12.9 g/dL — AB (ref 13.0–17.0)
MCH: 31.4 pg (ref 26.0–34.0)
MCHC: 34 g/dL (ref 30.0–36.0)
MCV: 92.2 fL (ref 78.0–100.0)
Platelets: 269 10*3/uL (ref 150–400)
RBC: 4.11 MIL/uL — ABNORMAL LOW (ref 4.22–5.81)
RDW: 14.5 % (ref 11.5–15.5)
WBC: 24.1 10*3/uL — ABNORMAL HIGH (ref 4.0–10.5)

## 2014-10-24 LAB — GLUCOSE, CAPILLARY
GLUCOSE-CAPILLARY: 104 mg/dL — AB (ref 65–99)
Glucose-Capillary: 143 mg/dL — ABNORMAL HIGH (ref 65–99)
Glucose-Capillary: 162 mg/dL — ABNORMAL HIGH (ref 65–99)
Glucose-Capillary: 106 mg/dL — ABNORMAL HIGH (ref 65–99)
Glucose-Capillary: 121 mg/dL — ABNORMAL HIGH (ref 65–99)
Glucose-Capillary: 101 mg/dL — ABNORMAL HIGH (ref 65–99)

## 2014-10-24 LAB — APTT
APTT: 54 s — AB (ref 24–37)
APTT: 34 s (ref 24–37)
aPTT: 146 seconds — ABNORMAL HIGH (ref 24–37)

## 2014-10-24 LAB — LACTIC ACID, PLASMA: LACTIC ACID, VENOUS: 1 mmol/L (ref 0.5–2.0)

## 2014-10-24 LAB — BASIC METABOLIC PANEL
Anion gap: 8 (ref 5–15)
BUN: 38 mg/dL — AB (ref 6–20)
CO2: 23 mmol/L (ref 22–32)
Calcium: 8.7 mg/dL — ABNORMAL LOW (ref 8.9–10.3)
Chloride: 106 mmol/L (ref 101–111)
Creatinine, Ser: 1.64 mg/dL — ABNORMAL HIGH (ref 0.61–1.24)
GFR calc Af Amer: 45 mL/min — ABNORMAL LOW (ref >60)
GFR calc non Af Amer: 39 mL/min — ABNORMAL LOW (ref >60)
Glucose, Bld: 136 mg/dL — ABNORMAL HIGH (ref 65–99)
Potassium: 3.6 mmol/L (ref 3.5–5.1)
Sodium: 137 mmol/L (ref 135–145)

## 2014-10-24 LAB — PHOSPHORUS: Phosphorus: 2.2 mg/dL — ABNORMAL LOW (ref 2.5–4.6)

## 2014-10-24 LAB — HEPARIN LEVEL (UNFRACTIONATED): HEPARIN UNFRACTIONATED: 1.52 [IU]/mL — AB (ref 0.30–0.70)

## 2014-10-24 LAB — MAGNESIUM: Magnesium: 1.9 mg/dL (ref 1.7–2.4)

## 2014-10-24 SURGERY — DECOMPRESSION, INTESTINE
Anesthesia: Moderate Sedation

## 2014-10-24 MED ORDER — POTASSIUM CHLORIDE 10 MEQ/100ML IV SOLN
10.0000 meq | INTRAVENOUS | Status: AC
  Administered 2014-10-24 (×2): 10 meq via INTRAVENOUS
  Filled 2014-10-24 (×4): qty 100

## 2014-10-24 MED ORDER — SODIUM CHLORIDE 0.9 % IV SOLN
INTRAVENOUS | Status: DC
  Administered 2014-10-24 – 2014-10-26 (×2): via INTRAVENOUS
  Filled 2014-10-24 (×12): qty 1000

## 2014-10-24 MED ORDER — MIDAZOLAM HCL 5 MG/5ML IJ SOLN
INTRAMUSCULAR | Status: DC | PRN
  Administered 2014-10-24 (×3): 1 mg via INTRAVENOUS

## 2014-10-24 MED ORDER — SODIUM CHLORIDE 0.9 % IV SOLN: INTRAVENOUS | Status: DC

## 2014-10-24 MED ORDER — SODIUM CHLORIDE 0.9 % IV SOLN
250.0000 mg | Freq: Three times a day (TID) | INTRAVENOUS | Status: DC
  Administered 2014-10-24 – 2014-11-10 (×50): 250 mg via INTRAVENOUS
  Filled 2014-10-24 (×64): qty 5

## 2014-10-24 MED ORDER — FENTANYL CITRATE (PF) 100 MCG/2ML IJ SOLN
INTRAMUSCULAR | Status: DC | PRN
  Administered 2014-10-24 (×3): 12.5 ug via INTRAVENOUS

## 2014-10-24 MED ORDER — DIPHENHYDRAMINE HCL 50 MG/ML IJ SOLN
INTRAMUSCULAR | Status: AC
  Filled 2014-10-24: qty 1

## 2014-10-24 MED ORDER — MAGNESIUM SULFATE 2 GM/50ML IV SOLN
2.0000 g | Freq: Once | INTRAVENOUS | Status: AC
  Administered 2014-10-24: 2 g via INTRAVENOUS
  Filled 2014-10-24: qty 50

## 2014-10-24 MED ORDER — HEPARIN (PORCINE) IN NACL 100-0.45 UNIT/ML-% IJ SOLN
1250.0000 [IU]/h | INTRAMUSCULAR | Status: DC
  Administered 2014-10-24: 1100 [IU]/h via INTRAVENOUS
  Administered 2014-10-26 – 2014-10-31 (×7): 1250 [IU]/h via INTRAVENOUS
  Filled 2014-10-24 (×14): qty 250

## 2014-10-24 MED ORDER — METOCLOPRAMIDE HCL 5 MG/ML IJ SOLN
5.0000 mg | Freq: Three times a day (TID) | INTRAMUSCULAR | Status: DC
  Administered 2014-10-24 (×2): 5 mg via INTRAVENOUS
  Filled 2014-10-24 (×4): qty 1

## 2014-10-24 MED ORDER — POTASSIUM CHLORIDE 20 MEQ/15ML (10%) PO SOLN
40.0000 meq | Freq: Two times a day (BID) | ORAL | Status: DC
  Administered 2014-10-24 – 2014-10-27 (×7): 40 meq via ORAL
  Filled 2014-10-24 (×11): qty 30

## 2014-10-24 MED ORDER — METOCLOPRAMIDE HCL 5 MG/ML IJ SOLN
10.0000 mg | Freq: Four times a day (QID) | INTRAMUSCULAR | Status: DC
  Administered 2014-10-24 – 2014-11-09 (×44): 10 mg via INTRAVENOUS
  Filled 2014-10-24 (×66): qty 2

## 2014-10-24 MED ORDER — MIDAZOLAM HCL 5 MG/ML IJ SOLN
INTRAMUSCULAR | Status: AC
  Filled 2014-10-24: qty 2

## 2014-10-24 MED ORDER — FENTANYL CITRATE (PF) 100 MCG/2ML IJ SOLN
INTRAMUSCULAR | Status: AC
  Filled 2014-10-24: qty 2

## 2014-10-24 NOTE — Progress Notes (Addendum)
Patient ID: Howard Charles, male   DOB: 01-13-1936, 79 y.o.   MRN: [id number]     Subjective:  Patient reports pain as mild to moderate.  Patient is sleeping and does state that he has pain in the abdomen.  Minimal pain in the hip  Objective:   VITALS:   Filed Vitals:   10/24/14 1550 10/24/14 1555 10/24/14 1600 10/24/14 1605  BP: 124/94 124/51 124/94 116/68  Pulse: 83 79 85 82  Temp: 97.9 F (36.6 C)     TempSrc: Oral     Resp: 22 21 21 18   Height:      Weight:      SpO2: 94% 95% 96% 97%    ABD soft Sensation intact distally Dorsiflexion/Plantar flexion intact Incision: dressing C/D/I and scant drainage   Lab Results  Component Value Date   WBC 24.1* 10/24/2014   HGB 12.9* 10/24/2014   HCT 37.9* 10/24/2014   MCV 92.2 10/24/2014   PLT 269 10/24/2014   BMET    Component Value Date/Time   NA 137 10/24/2014 0448   K 3.6 10/24/2014 0448   CL 106 10/24/2014 0448   CO2 23 10/24/2014 0448   GLUCOSE 136* 10/24/2014 0448   BUN 38* 10/24/2014 0448   CREATININE 1.64* 10/24/2014 0448   CALCIUM 8.7* 10/24/2014 0448   GFRNONAA 39* 10/24/2014 0448   GFRAA 45* 10/24/2014 0448     Assessment/Plan: Day of Surgery   Principal Problem:   Closed left hip fracture Active Problems:   Diabetes mellitus type 2 in obese   Hypertension   Chronic renal insufficiency, stage III (moderate)   A-fib   Prostate cancer   Atrial fibrillation with RVR   Ileus, postoperative   Continue plan per medicine Dry dressing left lower ext  WBAT left lower ext Will continue to follow   [name] 10/24/2014, 6:18 PM  Discussed and agree with above.    [name], MD Cell [phone]   ADDENDUM:  PATIENT DOES NOT HAVE ALZHEIMER'S.  INCORRECTLY PULLED FORWARD FROM HISTORICAL INACCURATE NOTE.  THIS NOTE HAS BEEN CORRECTED.

## 2014-10-24 NOTE — Progress Notes (Signed)
Patient is more distended than yesterday, by his and his wife's report, and he is somewhat uncomfortable. The abdomen is significantly protuberant, equal to about 8 or 9 months of pregnancy, and is fairly firm and tense, but nontender. There is significant diffuse tympany. Sparse bowel sounds are present. His rectal tube is draining a small amount of liquid brown stool and he has a moderate amount of NG output. Unfortunately, his potassium has not improved significantly from yesterday.  His KUB from this morning was pending at the time of my seeing the patient.  Plan:  1. Dr. Watt Climes, our primary hospital doctor this week, will review the x-ray from today and decide whether decompressive colonoscopy is appropriate. 2. I will order supplemental potassium beyond that which has been added to his IV, and plan to recheck the level tomorrow.  Cleotis Nipper, M.D. Pager [phone] If no answer or after 5 PM call [phone]

## 2014-10-24 NOTE — Progress Notes (Signed)
Physical Therapy Treatment Patient Details Name: Howard Charles MRN: [id number] DOB: 1935-10-18 Today's Date: 10/24/2014    History of Present Illness Pt is a 79 y/o M s/p fall and L hip fx.  Pt's PMH includes DM, diabetic neuropathy, gout, HTN, B TKA, B hearing loss, chronic renal insufficiency, on supplemental O2 at night, a fib, rotator cuff arthroscopy.    PT Comments    Pt progressing towards physical therapy goals. Was able to perform transfers and ambulation with increased independence and cueing for technique and general safety awareness. Abdominal pain was the limiting factor this session.   Follow Up Recommendations  SNF;Supervision for mobility/OOB     Equipment Recommendations  Rolling walker with 5" wheels    Recommendations for Other Services       Precautions / Restrictions Precautions Precautions: Fall Restrictions Weight Bearing Restrictions: Yes LLE Weight Bearing: Weight bearing as tolerated    Mobility  Bed Mobility Overal bed mobility: Needs Assistance Bed Mobility: Supine to Sit     Supine to sit: Mod assist;+2 for physical assistance     General bed mobility comments: Assist w/ managing BLEs and using bed pad to bring hips to sitting EOB.  Pt w/ heavy use of bed rails and required max verbal cues during supine<>sit.    Transfers Overall transfer level: Needs assistance Equipment used: Rolling walker (2 wheeled) Transfers: Sit to/from Stand Sit to Stand: Min assist;+2 physical assistance         General transfer comment: Assist to power-up to full standing position. Increased time to gain and maintain balance prior to initiating gait training.   Ambulation/Gait Ambulation/Gait assistance: Min guard Ambulation Distance (Feet): 40 Feet Assistive device: Rolling walker (2 wheeled) Gait Pattern/deviations: Step-through pattern;Decreased stride length;Trunk flexed Gait velocity: Decreased Gait velocity interpretation: Below normal speed for  age/gender General Gait Details: Pt was cued for increased heel strike, improved posture, and for distance. 1 seated rest break required.    Stairs            Wheelchair Mobility    Modified Rankin (Stroke Patients Only)       Balance Overall balance assessment: Needs assistance Sitting-balance support: Feet supported;Bilateral upper extremity supported Sitting balance-Leahy Scale: Poor     Standing balance support: Bilateral upper extremity supported;During functional activity Standing balance-Leahy Scale: Poor                      Cognition Arousal/Alertness: Awake/alert Behavior During Therapy: WFL for tasks assessed/performed Overall Cognitive Status: History of cognitive impairments - at baseline                      Exercises      General Comments General comments (skin integrity, edema, etc.): Discussed HEP and using the RLE to assist the LLE in movement      Pertinent Vitals/Pain Pain Assessment: Faces Faces Pain Scale: Hurts even more Pain Location: Abdomen more than LLE Pain Descriptors / Indicators: Aching Pain Intervention(s): Limited activity within patient's tolerance;Monitored during session;Repositioned    Home Living                      Prior Function            PT Goals (current goals can now be found in the care plan section) Acute Rehab PT Goals Patient Stated Goal: to get rid of some of this pain. PT Goal Formulation: With patient Time For Goal Achievement: 10/28/14 Potential  to Achieve Goals: Good Progress towards PT goals: Progressing toward goals    Frequency  Min 3X/week    PT Plan Current plan remains appropriate    Co-evaluation             End of Session Equipment Utilized During Treatment: Gait belt Activity Tolerance: Patient limited by pain Patient left: in chair;with call bell/phone within reach;with family/visitor present     Time: 7001-7494 PT Time Calculation (min) (ACUTE  ONLY): 38 min  Charges:  $Gait Training: 23-37 mins $Therapeutic Activity: 8-22 mins                    G Codes:      Rolinda Roan Oct 25, 2014, 2:40 PM   Rolinda Roan, PT, DPT Acute Rehabilitation Services Pager: [phone]

## 2014-10-24 NOTE — Op Note (Signed)
[facility] [place], [id number]   COLONOSCOPY PROCEDURE REPORT     EXAM DATE: Nov 02, 2014  PATIENT NAME:      Howard Charles, Howard Charles           MR #:      [id number]  BIRTHDATE:       1936/01/16      VISIT #:     [id number] ATTENDING:     Clarene Essex, MD     STATUS:     inpatient ASSISTANT:      Laverta Baltimore and Corliss Parish   INDICATIONS:  The patient is a 79 yr old male here for a colonoscopy due to an abnormal imaging results. colonic dilation and increased cecum dilation PROCEDURE PERFORMED:     Colonoscopy, diagnosticWith decompression  MEDICATIONS:     Fentanyl 37.5 mcg IV and Versed 3 mg IV ESTIMATED BLOOD LOSS:     None  CONSENT: The patient understands the risks and benefits of the procedure and understands that these risks include, but are not limited to: sedation, allergic reaction, infection, perforation and/or bleeding. Alternative means of evaluation and treatment include, among others: physical exam, x-rays, and/or surgical intervention. The patient elects to proceed with this endoscopic procedure.  DESCRIPTION OF PROCEDURE: During intra-op preparation period all mechanical & medical equipment was checked for proper function. Hand hygiene and appropriate measures for infection prevention was taken. After the risks, benefits and alternatives of the procedure were thoroughly explained, Informed consent was verified, confirmed and timeout was successfully executed by the treatment team. A digital exam revealed no abnormalities of the rectum. The EC-[id number] ([id number]) endoscope was introduced through the anus and advanced probablyto the proximal transverse colon.we tried him on his back and using minimal abdominal pressure trying to advance any further caused increased pain and we elected to withdrawal after suctioning as much air as we could.although it was an unprepped exam the prep was probably fair overall despite being an  unprepped exam and over 1 L of fluid was suctioned The instrument was then slowly withdrawn as the colon was quicklyexaminedsince our goal was to suction air primarily.Estimated blood loss is zero unless otherwise noted in this procedure report. the findings are recorded below      Retroflexion was not performed. The scope was then completely withdrawn from the patient and the procedure terminated. SCOPE WITHDRAWAL TIME: see nurse's note    ADVERSE EVENTS:      There were no immediate complications.  IMPRESSIONS:     1.Multiple short discrete areas of ischemia in the sigmoid descending and transverse with normal bowel in between an air and fluid suctioned as above and no other obvious findings in unprepped exam   RECOMMENDATIONS:     continue to try to get potassium greater than 4 continue NG suction for now with alternating MiraLAX okay to hold rectal tube and follow labs exams and x-ray tomorrow morning and call sooner when necessary RECALL:     as needed  _____________________________ Clarene Essex, MD eSigned:  Clarene Essex, MD 2014/11/02 4:13 PM   cc:   CPT CODES: ICD CODES:  The ICD and CPT codes recommended by this software are interpretations from the data that the clinical staff has captured with the software.  The verification of the translation of this report to the ICD and CPT codes and modifiers is the sole responsibility of the health care institution and practicing physician where this report was generated.  [employer] will not be held responsible for the validity of the ICD and CPT codes included on this report.  AMA assumes no liability for data contained or not contained herein. CPT is a Designer, television/film set of the [employer].   PATIENT NAME:  Steel, Kerney MR#: [id number]

## 2014-10-24 NOTE — Progress Notes (Addendum)
[facility] PROGRESS NOTE Interim History: 79 y.o. male  has a past medical history of Diabetes mellitus type 2 in obese; Diabetic neuropathy; Hypertension; GERD  Gout; Left knee DJD; Right knee DJD; PONV  Bilateral hearing loss; Chronic renal insufficiency, stage III (moderate); Dysrhythmia; and On supplemental oxygen therapy. Presented with fall while at the grocery store, Patient lost balance and fell hitting his HEAD and left hip he was not able to get up. EMS was called and he arrived to ER. Plain imaging showed Intertochanteric fracture of the left hip. Patient with hx of A.fib on Xarelto last dose this AM and coreg. On arrival to ER he was tachycardic with HR up to 130 with A.fib he was given a dose of diltiazem and improved with HR down to 80's currently converted to sinus rhythm with heart rate of 70    Assessment/Plan: Ileus, postoperative: - Patient cont to have abdominal distention. - Repeated abdominal x-rays pending. - Place NG tube and rectal tube to help decompress colon. - His that blood cell count continues to rise, has remained afebrile appreciate Gastrenterology's assistance. - on reglan will cont, change IV fluid to NS with potassium supplemet, and replete mag, will try to keep mag. > 2.0 and K >4.0, check a lactic acid.  Atrial fibrillation with RVR: - Cardiology was consulted he was started on IV diltiazem, now in sinus rhythm. Cont IV diltiazem. - As the patient is currently nothing by mouth we'll switch him to IV heparin.  Left closed hip fracture: - Orthopedic surgery was consulted and into her trochanteric IM nailing performed on 10/18/2014. - Physical therapy was consulted which recommended skilled nursing facility.  Alzheimer disease: Very subtle.  Acute on Chronic kidney see stage III: - Most likely prerenal due to decreased oral intake, will start IV fluids strict I's and o's place Foley.  Prostate cancer  Diabetes mellitus type 2 in  obese: - cont Lantus plus SSI.  Essential  Hypertension - Bp stable.  Code Status: Full Code Family Communication: d/w patient Disposition Plan: remain inpatient   Consultants:  GI  Procedures:  abd x-ray  Antibiotics:  None  HPI/Subjective: Patient relates he is not nauseated and has some abdominal discomfort.  Objective: Filed Vitals:   10/23/14 0503 10/23/14 1402 10/23/14 1928 10/24/14 0449  BP: 132/71 111/61 124/75 127/71  Pulse: 77 97 87 86  Temp: 98.5 F (36.9 C) 99.3 F (37.4 C) 97.7 F (36.5 C) 99 F (37.2 C)  TempSrc: Oral Oral Oral Oral  Resp: 18 18 18 18   SpO2: 94% 95% 90% 91%    Intake/Output Summary (Last 24 hours) at 10/24/14 0737 Last data filed at 10/24/14 0600  Gross per 24 hour  Intake      0 ml  Output   2125 ml  Net  -2125 ml   There were no vitals filed for this visit.  Exam:  General: Alert, awake, oriented x3, in no acute distress.  HEENT: No bruits, no goiter.  Heart: Regular rate and rhythm. Lungs: Good air movement, bilateral air movement.  Abdomen: Soft, mild diffuse tender, distended, no bowel sounds.  Neuro: Grossly intact, nonfocal.   Data Reviewed: Basic Metabolic Panel:  Recent Labs Lab 10/19/14 1526 10/21/14 0415 10/22/14 0408 10/23/14 0529 10/24/14 0448  NA 140 137 138 135 137  K 4.2 4.2 3.6 3.7 3.6  CL 106 104 104 105 106  CO2 22 24 24  21* 23  GLUCOSE 201* 104* 133* 109* 136*  BUN 30*  18 21* 36* 38*  CREATININE 1.52* 1.55* 1.37* 1.69* 1.64*  CALCIUM 9.3 8.6* 9.0 8.7* 8.7*  MG  --   --   --   --  1.9  PHOS  --   --   --   --  2.2*   Liver Function Tests: No results for input(s): AST, ALT, ALKPHOS, BILITOT, PROT, ALBUMIN in the last 168 hours. No results for input(s): LIPASE, AMYLASE in the last 168 hours. No results for input(s): AMMONIA in the last 168 hours. CBC:  Recent Labs Lab 10/19/14 1526 10/21/14 0415 10/22/14 0408 10/23/14 0529 10/24/14 0448  WBC 10.9* 13.4* 14.4* 17.2* 24.1*   NEUTROABS 7.2  --   --   --   --   HGB 15.2 13.3 12.8* 12.3* 12.9*  HCT 44.4 39.5 37.2* 37.3* 37.9*  MCV 93.3 93.8 92.3 93.3 92.2  PLT 196 160 176 203 269   Cardiac Enzymes: No results for input(s): CKTOTAL, CKMB, CKMBINDEX, TROPONINI in the last 168 hours. BNP (last 3 results) No results for input(s): BNP in the last 8760 hours.  ProBNP (last 3 results) No results for input(s): PROBNP in the last 8760 hours.  CBG:  Recent Labs Lab 10/23/14 1107 10/23/14 1613 10/23/14 2134 10/24/14 0042 10/24/14 0447  GLUCAP 124* 161* 192* 162* 143*    Recent Results (from the past 240 hour(s))  Urine culture     Status: None   Collection Time: 10/20/14  1:34 AM  Result Value Ref Range Status   Specimen Description URINE, CATHETERIZED  Final   Special Requests NONE  Final   Colony Count NO GROWTH Performed at [facility]   Final   Culture NO GROWTH Performed at [facility]   Final   Report Status 10/21/2014 FINAL  Final  MRSA PCR Screening     Status: None   Collection Time: 10/20/14  4:20 AM  Result Value Ref Range Status   MRSA by PCR NEGATIVE NEGATIVE Final    Comment:        The GeneXpert MRSA Assay (FDA approved for NASAL specimens only), is one component of a comprehensive MRSA colonization surveillance program. It is not intended to diagnose MRSA infection nor to guide or monitor treatment for MRSA infections.      Studies: Dg Abd Portable 1v  10/22/2014   CLINICAL DATA:  Ileus, followup  EXAM: PORTABLE ABDOMEN - 1 VIEW  COMPARISON:  Abdomen film of 10/21/2014  FINDINGS: There is little change in gaseous distention of both large and small bowel. This pattern is most consistent with ileus. The cecum currently measures 12 cm in diameter, and continued followup is recommended. Multiple surgical clips are noted within the pelvis and prior left hip pinning is present.  IMPRESSION: Persistent ileus pattern.  Recommend continued follow-up.   Electronically  Signed   By: Ivar Drape M.D.   On: 10/22/2014 08:09    Scheduled Meds: . allopurinol  300 mg Oral q morning - 10a  . fluconazole (DIFLUCAN) IV  100 mg Intravenous Q24H  . insulin aspart  0-9 Units Subcutaneous 6 times per day  . insulin glargine  40 Units Subcutaneous QHS  . metoCLOPramide (REGLAN) injection  5 mg Intravenous 3 times per day  . metoprolol  5 mg Intravenous 4 times per day  . polyethylene glycol  17 g Oral QID  . rosuvastatin  10 mg Oral QPM   Continuous Infusions: . sodium chloride 100 mL/hr at 10/24/14 0053  . diltiazem (CARDIZEM) infusion    .  heparin 1,000 Units/hr (10/24/14 0144)    Time Spent: 25 min   Charlynne Cousins  [facility] Pager [phone]. If 7PM-7AM, please contact night-coverage at [email or web address], password [facility] 10/24/2014, 7:37 AM  LOS: 5 days

## 2014-10-24 NOTE — Progress Notes (Signed)
ANTICOAGULATION CONSULT NOTE - Initial Consult  Pharmacy Consult for heparin Indication: atrial fibrillation  No Known Allergies  Patient Measurements: Wt= 101.6kg Ht= 5' 10'' IBW= 73kg Heparin dosing wt: 94kg  Vital Signs: Temp: 99 F (37.2 C) (05/26 0449) Temp Source: Oral (05/26 0449) BP: 127/71 mmHg (05/26 0449) Pulse Rate: 86 (05/26 0449)  Labs:  Recent Labs  10/22/14 0408 10/23/14 0529 10/23/14 1125 10/23/14 2339 10/24/14 0448 10/24/14 0915  HGB 12.8* 12.3*  --   --  12.9*  --   HCT 37.2* 37.3*  --   --  37.9*  --   PLT 176 203  --   --  269  --   APTT  --   --  37 146*  --  54*  HEPARINUNFRC  --   --  >2.20*  --   --  1.52*  CREATININE 1.37* 1.69*  --   --  1.64*  --     Estimated Creatinine Clearance: 45 mL/min (by C-G formula based on Cr of 1.64).   Medical History: Past Medical History  Diagnosis Date  . Diabetes mellitus type 2 in obese   . Diabetic neuropathy   . Hypertension   . GERD (gastroesophageal reflux disease)   . Gout   . Left knee DJD   . Right knee DJD   . PONV (postoperative nausea and vomiting)     H/O ....BUT LAST FEW TIMES---NONE  . Bilateral hearing loss     WEARS HEARING AIDS........RIGHT IS BETTER THEN LEFT  . Chronic renal insufficiency, stage III (moderate)     Creatinine 1.47  . Dysrhythmia     h/o A Fib  . On supplemental oxygen therapy     at night  . Ileus, postoperative 10/22/2014    Medications:  Scheduled:  . allopurinol  300 mg Oral q morning - 10a  . erythromycin  250 mg Intravenous 3 times per day  . fluconazole (DIFLUCAN) IV  100 mg Intravenous Q24H  . insulin aspart  0-9 Units Subcutaneous 6 times per day  . insulin glargine  40 Units Subcutaneous QHS  . metoCLOPramide (REGLAN) injection  5 mg Intravenous 3 times per day  . metoprolol  5 mg Intravenous 4 times per day  . polyethylene glycol  17 g Oral QID  . potassium chloride  10 mEq Intravenous Q1 Hr x 4  . potassium chloride  40 mEq Oral BID  .  rosuvastatin  10 mg Oral QPM   Infusions:  . diltiazem (CARDIZEM) infusion    . heparin 1,000 Units/hr (10/24/14 0144)  . sodium chloride 0.9 % 1,000 mL with potassium chloride 40 mEq infusion 100 mL/hr at 10/24/14 1037    Assessment: 79 yo male with afib on xarelto. He is s/p fall with hip fracture and repair on 5/22 and and in now noted with post-op ileus. There is concern for absorption of po Xarelto and this was discussed with Dr. Olevia Bowens. Pharmacy is dosing heparin and heparin level is 1.52 (xarelto influence) and aptt is below goal at 56.  Last dose of xarelto was5/24 at about 4pm.   Goal of Therapy:  Heparin level 0.3-0.7 units/ml aPTT 66-102 seconds Monitor platelets by anticoagulation protocol: Yes   Plan:  -Increase heparin to 1200 units/hr -aPTT in 8 hrs -daily aPTT, heparin level and CBC  Hildred Laser, Pharm D 10/24/2014 12:55 PM

## 2014-10-24 NOTE — Progress Notes (Signed)
ANTICOAGULATION CONSULT NOTE - Initial Consult  Pharmacy Consult for heparin Indication: atrial fibrillation  No Known Allergies  Patient Measurements: Wt= 101.6kg Ht= 5' 10'' IBW= 73kg Heparin dosing wt: 94kg  Vital Signs: Temp: 97.7 F (36.5 C) (05/25 1928) Temp Source: Oral (05/25 1928) BP: 124/75 mmHg (05/25 1928) Pulse Rate: 87 (05/25 1928)  Labs:  Recent Labs  10/21/14 0415 10/22/14 0408 10/23/14 0529 10/23/14 1125 10/23/14 2339  HGB 13.3 12.8* 12.3*  --   --   HCT 39.5 37.2* 37.3*  --   --   PLT 160 176 203  --   --   APTT  --   --   --  37 146*  HEPARINUNFRC  --   --   --  >2.20*  --   CREATININE 1.55* 1.37* 1.69*  --   --     Estimated Creatinine Clearance: 43.7 mL/min (by C-G formula based on Cr of 1.69).   Medical History: Past Medical History  Diagnosis Date  . Diabetes mellitus type 2 in obese   . Diabetic neuropathy   . Hypertension   . GERD (gastroesophageal reflux disease)   . Gout   . Left knee DJD   . Right knee DJD   . PONV (postoperative nausea and vomiting)     H/O ....BUT LAST FEW TIMES---NONE  . Bilateral hearing loss     WEARS HEARING AIDS........RIGHT IS BETTER THEN LEFT  . Chronic renal insufficiency, stage III (moderate)     Creatinine 1.47  . Dysrhythmia     h/o A Fib  . On supplemental oxygen therapy     at night  . Ileus, postoperative 10/22/2014    Medications:  Scheduled:  . allopurinol  300 mg Oral q morning - 10a  . fluconazole (DIFLUCAN) IV  100 mg Intravenous Q24H  . insulin aspart  0-9 Units Subcutaneous 6 times per day  . insulin glargine  40 Units Subcutaneous QHS  . metoCLOPramide (REGLAN) injection  5 mg Intravenous 3 times per day  . metoprolol  5 mg Intravenous 4 times per day  . polyethylene glycol  17 g Oral QID  . rosuvastatin  10 mg Oral QPM   Infusions:  . sodium chloride 100 mL/hr at 10/23/14 1255  . diltiazem (CARDIZEM) infusion    . heparin 1,300 Units/hr (10/23/14 1734)     Assessment: 79 yo male with afib on xarelto. He is s/p fall with hip fracture and repair on 5/22 and and in now noted with post-op ileus. There is concern for absorption of po Xarelto and this was discussed with Dr. Olevia Bowens. Pharmacy to begin IV heparin. Last dose of xarelto was  5/24 at about 4pm.   APTT is SUPRAtherapeutic at 146 sec on heparin 1300 units/hr. Nurse reports no issues with infusion of bleeding.  Goal of Therapy:  Heparin level 0.3-0.7 units/ml aPTT 66-102 seconds Monitor platelets by anticoagulation protocol: Yes   Plan:  Will hold heparin for 1 hours and restart at 1000 units/hr APTT in 8 hrs Daily heparin level and aPTT Monitor s/sx of bleeding  Andrey Cota. Diona Foley, PharmD Clinical Pharmacist Pager [phone]  10/24/2014 12:34 AM

## 2014-10-24 NOTE — Progress Notes (Signed)
ANTICOAGULATION CONSULT NOTE - Initial Consult  Pharmacy Consult for heparin Indication: atrial fibrillation  No Known Allergies  Patient Measurements: Wt= 101.6kg Ht= 5' 10'' IBW= 73kg Heparin dosing wt: 94kg  Vital Signs: Temp: 100.2 F (37.9 C) (05/26 2152) Temp Source: Axillary (05/26 2152) BP: 135/76 mmHg (05/26 2152) Pulse Rate: 93 (05/26 2152)  Labs:  Recent Labs  10/22/14 0408 10/23/14 0529  10/23/14 1125 10/23/14 2339 10/24/14 0448 10/24/14 0915 10/24/14 1902 10/24/14 2247  HGB 12.8* 12.3*  --   --   --  12.9*  --  11.9*  --   HCT 37.2* 37.3*  --   --   --  37.9*  --  34.8*  --   PLT 176 203  --   --   --  269  --  237  --   APTT  --   --   < > 37 146*  --  54*  --  34  HEPARINUNFRC  --   --   --  >2.20*  --   --  1.52*  --   --   CREATININE 1.37* 1.69*  --   --   --  1.64*  --   --   --   < > = values in this interval not displayed.  Estimated Creatinine Clearance: 45 mL/min (by C-G formula based on Cr of 1.64).   Medical History: Past Medical History  Diagnosis Date  . Diabetes mellitus type 2 in obese   . Diabetic neuropathy   . Hypertension   . GERD (gastroesophageal reflux disease)   . Gout   . Left knee DJD   . Right knee DJD   . Bilateral hearing loss     WEARS HEARING AIDS........RIGHT IS BETTER THEN LEFT  . Chronic renal insufficiency, stage III (moderate)     Creatinine 1.47  . Dysrhythmia     h/o A Fib  . On supplemental oxygen therapy     at night  . Ileus, postoperative 10/22/2014  . PONV (postoperative nausea and vomiting)     H/O ....BUT LAST FEW TIMES---NONE    Medications:  Scheduled:  . allopurinol  300 mg Oral q morning - 10a  . erythromycin  250 mg Intravenous 3 times per day  . fluconazole (DIFLUCAN) IV  100 mg Intravenous Q24H  . insulin aspart  0-9 Units Subcutaneous 6 times per day  . insulin glargine  40 Units Subcutaneous QHS  . [START ON 10/25/2014] metoCLOPramide (REGLAN) injection  10 mg Intravenous 4 times  per day  . metoprolol  5 mg Intravenous 4 times per day  . polyethylene glycol  17 g Oral QID  . potassium chloride  40 mEq Oral BID  . rosuvastatin  10 mg Oral QPM   Infusions:  . sodium chloride    . diltiazem (CARDIZEM) infusion    . heparin 1,100 Units/hr (10/24/14 1421)  . sodium chloride 0.9 % 1,000 mL with potassium chloride 40 mEq infusion 100 mL/hr at 10/24/14 1037    Assessment: 79 yo male with afib on xarelto. He is s/p fall with hip fracture and repair on 5/22 and and in now noted with post-op ileus. There is concern for absorption of po Xarelto and this was discussed with Dr. Olevia Bowens. Pharmacy is dosing heparin and heparin level is 1.52 (xarelto influence) and aptt is below goal at 56.  Last dose of xarelto was 5/24 at about 4pm.   Follow-up aPTT remains subtherapeutic on heparin 1100 units/hr. Nurse reports  no issues with infusion or bleeding.  Goal of Therapy:  Heparin level 0.3-0.7 units/ml aPTT 66-102 seconds Monitor platelets by anticoagulation protocol: Yes   Plan:  Increase heparin to 1200 units/hr 8h HL/aPTT Daily HL/aPTT/CBC  Andrey Cota. Diona Foley, PharmD Clinical Pharmacist Pager [phone] 10/24/2014 11:53 PM

## 2014-10-24 NOTE — Consult Note (Signed)
Reason for Consult: Abdominal distention Referring Physician: Dr. Bess Harvest Elbert Spickler is an 79 y.o. male.  HPI: This is a 79 yo male who is 4 days s/p IM nail for a left intertrochanteric hip fracture after suffering a fall.  The patient was on Xarelto for atrial fibrillation.  The patient has multiple other comorbidities including DM@, obesity, chronic renal insufficiency.  Since surgery, the patient has become distended and has had no bowel movements.  He denies any nausea.  Plain films showed colonic distention, but no sign of small bowel obstruction.  GI was consulted today and Dr. Watt Climes performed a decompressive colonoscopy two hours ago.  There some patchy areas of ischemia in the sigmoid and transverse colon, but no other abnormalities noted.  The patient has been hypokalemic and his WBC has been increasing, so surgery was then consulted.   Past Medical History  Diagnosis Date  . Diabetes mellitus type 2 in obese   . Diabetic neuropathy   . Hypertension   . GERD (gastroesophageal reflux disease)   . Gout   . Left knee DJD   . Right knee DJD   . Bilateral hearing loss     WEARS HEARING AIDS........RIGHT IS BETTER THEN LEFT  . Chronic renal insufficiency, stage III (moderate)     Creatinine 1.47  . Dysrhythmia     h/o A Fib  . On supplemental oxygen therapy     at night  . Ileus, postoperative 10/22/2014  . PONV (postoperative nausea and vomiting)     H/O ....BUT LAST FEW TIMES---NONE    Past Surgical History  Procedure Laterality Date  . Knee arthroscopy  2008    left   . Knee arthroscopy  2012    left  . Knee arthroscopy  2002    right  . Hemorrhoid surgery    . Inner ear surgery    . Shoulder arthroscopy with rotator cuff repair  2007  . Suprapubic prostatectomy      21 YRS  AGO  . Cholecystectomy    . Tonsillectomy    . Total knee arthroplasty  06/26/2012    Procedure: TOTAL KNEE ARTHROPLASTY;  Surgeon: Lorn Junes, MD;  Location: [facility];   Service: Orthopedics;  Laterality: Left;  . Colonoscopy    . Total knee arthroplasty Right 05/21/2013    Procedure: TOTAL KNEE ARTHROPLASTY;  Surgeon: Lorn Junes, MD;  Location: [place];  Service: Orthopedics;  Laterality: Right;  . Intramedullary (im) nail intertrochanteric Left 10/20/2014  . Intramedullary (im) nail intertrochanteric Left 10/20/2014    Procedure: INTRAMEDULLARY (IM) NAIL INTERTROCHANTRIC;  Surgeon: [name], MD;  Location: [facility];  Service: Orthopedics;  Laterality: Left;    Family History  Problem Relation Age of Onset  . Hypertension Mother   . Diabetes Mother   . Heart disease Mother   . Heart disease Father   . Heart attack Father   . Hypertension Father   . Diabetes Mellitus II Father     Social History:  reports that he has never smoked. He has never used smokeless tobacco. He reports that he does not drink alcohol or use illicit drugs.  Allergies: No Known Allergies  Medications: Prior to Admission medications   Medication Sig Start Date End Date Taking? Authorizing Provider  allopurinol (ZYLOPRIM) 300 MG tablet Take 300 mg by mouth every morning.    Yes Historical Provider, MD  carvedilol (COREG) 6.25 MG tablet Take 6.25 mg by mouth 2 (two) times  daily with a meal.   Yes Historical Provider, MD  insulin glargine (LANTUS) 100 UNIT/ML injection Inject 52 Units into the skin daily before breakfast.    Yes Historical Provider, MD  Multiple Vitamins-Minerals (CENTRUM SILVER ULTRA MENS PO) Take 1 tablet by mouth daily.   Yes Historical Provider, MD  omega-3 acid ethyl esters (LOVAZA) 1 G capsule Take 1 g by mouth every morning.   Yes Historical Provider, MD  pantoprazole (PROTONIX) 40 MG tablet Take 40 mg by mouth every evening.    Yes Historical Provider, MD  rosuvastatin (CRESTOR) 10 MG tablet Take 10 mg by mouth every evening.    Yes Historical Provider, MD  saxagliptin HCl (ONGLYZA) 5 MG TABS tablet Take 5 mg by mouth every morning.    Yes Historical  Provider, MD  telmisartan-hydrochlorothiazide (MICARDIS HCT) 80-25 MG per tablet Take 1 tablet by mouth every morning.    Yes Historical Provider, MD  baclofen (LIORESAL) 10 MG tablet Take 1 tablet (10 mg total) by mouth 3 (three) times daily. As needed for muscle spasm 10/20/14   [name], MD  HYDROcodone-acetaminophen [employer]) 10-325 MG per tablet Take 1-2 tablets by mouth every 6 (six) hours as needed. 10/20/14   [name], MD  oxyCODONE (OXY IR/ROXICODONE) 5 MG immediate release tablet 1-2 tablets every 4-6 hrs as needed for pain Patient not taking: Reported on 10/19/2014 05/22/13   Kirstin Shepperson, PA-C  rivaroxaban (XARELTO) 20 MG TABS tablet Take 1 tablet (20 mg total) by mouth every morning. 10/20/14   [name], MD  sennosides-docusate sodium (SENOKOT-S) 8.6-50 MG tablet Take 2 tablets by mouth daily. 10/20/14   [name], MD     Results for orders placed or performed during the hospital encounter of 10/19/14 (from the past 48 hour(s))  Glucose, capillary     Status: Abnormal   Collection Time: 10/22/14  8:05 PM  Result Value Ref Range   Glucose-Capillary 107 (H) 65 - 99 mg/dL  Glucose, capillary     Status: Abnormal   Collection Time: 10/23/14 12:35 AM  Result Value Ref Range   Glucose-Capillary 115 (H) 65 - 99 mg/dL   Comment 1 Notify RN   Glucose, capillary     Status: Abnormal   Collection Time: 10/23/14  5:02 AM  Result Value Ref Range   Glucose-Capillary 131 (H) 65 - 99 mg/dL   Comment 1 Notify RN   CBC     Status: Abnormal   Collection Time: 10/23/14  5:29 AM  Result Value Ref Range   WBC 17.2 (H) 4.0 - 10.5 K/uL   RBC 4.00 (L) 4.22 - 5.81 MIL/uL   Hemoglobin 12.3 (L) 13.0 - 17.0 g/dL   HCT 37.3 (L) 39.0 - 52.0 %   MCV 93.3 78.0 - 100.0 fL   MCH 30.8 26.0 - 34.0 pg   MCHC 33.0 30.0 - 36.0 g/dL   RDW 14.5 11.5 - 15.5 %   Platelets 203 150 - 400 K/uL  Basic metabolic panel     Status: Abnormal   Collection Time: 10/23/14  5:29 AM  Result Value Ref  Range   Sodium 135 135 - 145 mmol/L   Potassium 3.7 3.5 - 5.1 mmol/L   Chloride 105 101 - 111 mmol/L   CO2 21 (L) 22 - 32 mmol/L   Glucose, Bld 109 (H) 65 - 99 mg/dL   BUN 36 (H) 6 - 20 mg/dL   Creatinine, Ser 1.69 (H) 0.61 - 1.24 mg/dL   Calcium 8.7 (L)  8.9 - 10.3 mg/dL   GFR calc non Af Amer 37 (L) >60 mL/min   GFR calc Af Amer 43 (L) >60 mL/min    Comment: (NOTE) The eGFR has been calculated using the CKD EPI equation. This calculation has not been validated in all clinical situations. eGFR's persistently <60 mL/min signify possible Chronic Kidney Disease.    Anion gap 9 5 - 15  Glucose, capillary     Status: Abnormal   Collection Time: 10/23/14 11:07 AM  Result Value Ref Range   Glucose-Capillary 124 (H) 65 - 99 mg/dL  APTT     Status: None   Collection Time: 10/23/14 11:25 AM  Result Value Ref Range   aPTT 37 24 - 37 seconds    Comment:        IF BASELINE aPTT IS ELEVATED, SUGGEST PATIENT RISK ASSESSMENT BE USED TO DETERMINE APPROPRIATE ANTICOAGULANT THERAPY.   Heparin level (unfractionated)     Status: Abnormal   Collection Time: 10/23/14 11:25 AM  Result Value Ref Range   Heparin Unfractionated >2.20 (H) 0.30 - 0.70 IU/mL    Comment:        IF HEPARIN RESULTS ARE BELOW EXPECTED VALUES, AND PATIENT DOSAGE HAS BEEN CONFIRMED, SUGGEST FOLLOW UP TESTING OF ANTITHROMBIN III LEVELS. RESULTS CONFIRMED BY MANUAL DILUTION   Glucose, capillary     Status: Abnormal   Collection Time: 10/23/14  4:13 PM  Result Value Ref Range   Glucose-Capillary 161 (H) 65 - 99 mg/dL  Glucose, capillary     Status: Abnormal   Collection Time: 10/23/14  9:34 PM  Result Value Ref Range   Glucose-Capillary 192 (H) 65 - 99 mg/dL  APTT     Status: Abnormal   Collection Time: 10/23/14 11:39 PM  Result Value Ref Range   aPTT 146 (H) 24 - 37 seconds    Comment:        IF BASELINE aPTT IS ELEVATED, SUGGEST PATIENT RISK ASSESSMENT BE USED TO DETERMINE APPROPRIATE ANTICOAGULANT THERAPY.    Glucose, capillary     Status: Abnormal   Collection Time: 10/24/14 12:42 AM  Result Value Ref Range   Glucose-Capillary 162 (H) 65 - 99 mg/dL  Glucose, capillary     Status: Abnormal   Collection Time: 10/24/14  4:47 AM  Result Value Ref Range   Glucose-Capillary 143 (H) 65 - 99 mg/dL  CBC     Status: Abnormal   Collection Time: 10/24/14  4:48 AM  Result Value Ref Range   WBC 24.1 (H) 4.0 - 10.5 K/uL   RBC 4.11 (L) 4.22 - 5.81 MIL/uL   Hemoglobin 12.9 (L) 13.0 - 17.0 g/dL   HCT 37.9 (L) 39.0 - 52.0 %   MCV 92.2 78.0 - 100.0 fL   MCH 31.4 26.0 - 34.0 pg   MCHC 34.0 30.0 - 36.0 g/dL   RDW 14.5 11.5 - 15.5 %   Platelets 269 150 - 400 K/uL  Basic metabolic panel     Status: Abnormal   Collection Time: 10/24/14  4:48 AM  Result Value Ref Range   Sodium 137 135 - 145 mmol/L   Potassium 3.6 3.5 - 5.1 mmol/L   Chloride 106 101 - 111 mmol/L   CO2 23 22 - 32 mmol/L   Glucose, Bld 136 (H) 65 - 99 mg/dL   BUN 38 (H) 6 - 20 mg/dL   Creatinine, Ser 1.64 (H) 0.61 - 1.24 mg/dL   Calcium 8.7 (L) 8.9 - 10.3 mg/dL   GFR calc non  Af Amer 39 (L) >60 mL/min   GFR calc Af Amer 45 (L) >60 mL/min    Comment: (NOTE) The eGFR has been calculated using the CKD EPI equation. This calculation has not been validated in all clinical situations. eGFR's persistently <60 mL/min signify possible Chronic Kidney Disease.    Anion gap 8 5 - 15  Magnesium     Status: None   Collection Time: 10/24/14  4:48 AM  Result Value Ref Range   Magnesium 1.9 1.7 - 2.4 mg/dL  Phosphorus     Status: Abnormal   Collection Time: 10/24/14  4:48 AM  Result Value Ref Range   Phosphorus 2.2 (L) 2.5 - 4.6 mg/dL  Glucose, capillary     Status: Abnormal   Collection Time: 10/24/14  9:01 AM  Result Value Ref Range   Glucose-Capillary 106 (H) 65 - 99 mg/dL  Heparin level (unfractionated)     Status: Abnormal   Collection Time: 10/24/14  9:15 AM  Result Value Ref Range   Heparin Unfractionated 1.52 (H) 0.30 - 0.70 IU/mL     Comment: RESULTS CONFIRMED BY MANUAL DILUTION        IF HEPARIN RESULTS ARE BELOW EXPECTED VALUES, AND PATIENT DOSAGE HAS BEEN CONFIRMED, SUGGEST FOLLOW UP TESTING OF ANTITHROMBIN III LEVELS.   APTT     Status: Abnormal   Collection Time: 10/24/14  9:15 AM  Result Value Ref Range   aPTT 54 (H) 24 - 37 seconds    Comment:        IF BASELINE aPTT IS ELEVATED, SUGGEST PATIENT RISK ASSESSMENT BE USED TO DETERMINE APPROPRIATE ANTICOAGULANT THERAPY.   Glucose, capillary     Status: Abnormal   Collection Time: 10/24/14 11:36 AM  Result Value Ref Range   Glucose-Capillary 104 (H) 65 - 99 mg/dL  Glucose, capillary     Status: Abnormal   Collection Time: 10/24/14  5:09 PM  Result Value Ref Range   Glucose-Capillary 121 (H) 65 - 99 mg/dL    Dg Abd 1 View  10/24/2014   CLINICAL DATA:  Distention  EXAM: ABDOMEN - 1 VIEW  COMPARISON:  10/22/2014  FINDINGS: Gaseous distention of the ascending and transverse colon.  Left colon is relatively decompressed.  No evidence of small bowel obstruction.  Enteric tube in the distal stomach.  Cholecystectomy clips.  Surgical clips in the pelvis.  Status post ORIF of the left hip.  IMPRESSION: Gaseous distention of the right colon, suggesting adynamic colonic ileus.  No evidence of small bowel obstruction   Electronically Signed   By: Julian Hy M.D.   On: 10/24/2014 11:28    Review of Systems  Constitutional: Negative for weight loss.  HENT: Negative for ear discharge, ear pain, hearing loss and tinnitus.   Eyes: Negative for blurred vision, double vision, photophobia and pain.  Respiratory: Negative for cough, sputum production and shortness of breath.   Cardiovascular: Negative for chest pain.  Gastrointestinal: Positive for abdominal pain (mild, from distention). Negative for nausea and vomiting.  Genitourinary: Negative for dysuria, urgency, frequency and flank pain.  Musculoskeletal: Positive for joint pain. Negative for myalgias, back  pain, falls and neck pain.  Neurological: Negative for dizziness, tingling, sensory change, focal weakness, loss of consciousness and headaches.  Endo/Heme/Allergies: Does not bruise/bleed easily.  Psychiatric/Behavioral: Negative for depression, memory loss and substance abuse. The patient is not nervous/anxious.    Blood pressure 116/68, pulse 82, temperature 97.9 F (36.6 C), temperature source Oral, resp. rate 18, height 5' 10"  (1.778  m), weight 101.606 kg (224 lb), SpO2 97 %. Physical Exam Elderly male in NAD Abd - mildly distended; + bowel sounds; non-tender; no peritoneal signs  Assessment/Plan: 1.  Abdominal distention - secondary to colonic ileus.   2.  No peritonitis at this time  Recs:  Agree with continuing NG tube CT scan pending. Consider neostigmine Continue trying to correct K to 4.0 Add Reglan  Soriya Worster K. 10/24/2014, 6:08 PM

## 2014-10-24 NOTE — Progress Notes (Signed)
Subjective:  Experiencing significant abd discomfort from post operative ileus. Monitor reveals paroxysmal a.fib with episodes of RVR.  Denies any chest pain, SOB, or palpitations. Wife present at bedside. Plans for colonoscopy and decompression today. Objective:  Vital Signs in the last 24 hours: Temp:  [97.7 F (36.5 C)-99 F (37.2 C)] 97.9 F (36.6 C) (05/26 1550) Pulse Rate:  [76-87] 82 (05/26 1605) Resp:  [17-32] 18 (05/26 1605) BP: (86-127)/(48-94) 116/68 mmHg (05/26 1605) SpO2:  [90 %-97 %] 97 % (05/26 1605) Weight:  [101.606 kg (224 lb)] 101.606 kg (224 lb) (05/26 1442)  Intake/Output from previous day: 05/25 0701 - 05/26 0700 In: -  Out: 2125 [Urine:425; Emesis/NG output:900; Stool:800]  Physical Exam:   General appearance: alert, cooperative, appears stated age and moderately obese Mild distress due to pain. Neck: no adenopathy, no carotid bruit, no JVD, supple, symmetrical, trachea midline and thyroid not enlarged, symmetric, no tenderness/mass/nodules Resp: clear to auscultation bilaterally Chest wall: no tenderness Cardio: irregularly irregular rhythm, S1: variable, S2: normal, no S3 or S4 and no murmur GI: abnormal findings:  absent bowel sounds, distended, hypoactive bowel sounds and obese Extremities: extremities normal, atraumatic, no cyanosis or edema    Lab Results: BMP  Recent Labs  10/22/14 0408 10/23/14 0529 10/24/14 0448  NA 138 135 137  K 3.6 3.7 3.6  CL 104 105 106  CO2 24 21* 23  GLUCOSE 133* 109* 136*  BUN 21* 36* 38*  CREATININE 1.37* 1.69* 1.64*  CALCIUM 9.0 8.7* 8.7*  GFRNONAA 48* 37* 39*  GFRAA 56* 43* 45*    CBC  Recent Labs Lab 10/19/14 1526  10/24/14 0448  WBC 10.9*  < > 24.1*  RBC 4.76  < > 4.11*  HGB 15.2  < > 12.9*  HCT 44.4  < > 37.9*  PLT 196  < > 269  MCV 93.3  < > 92.2  MCH 31.9  < > 31.4  MCHC 34.2  < > 34.0  RDW 14.3  < > 14.5  LYMPHSABS 2.1  --   --   MONOABS 1.0  --   --   EOSABS 0.6  --   --   BASOSABS  0.1  --   --   < > = values in this interval not displayed.  HEMOGLOBIN A1C Lab Results  Component Value Date   HGBA1C 7.7* 10/22/2014   MPG 174 10/22/2014     Dg Abd 1 View  10/24/2014   CLINICAL DATA:  Distention  EXAM: ABDOMEN - 1 VIEW  COMPARISON:  10/22/2014  FINDINGS: Gaseous distention of the ascending and transverse colon.  Left colon is relatively decompressed.  No evidence of small bowel obstruction.  Enteric tube in the distal stomach.  Cholecystectomy clips.  Surgical clips in the pelvis.  Status post ORIF of the left hip.  IMPRESSION: Gaseous distention of the right colon, suggesting adynamic colonic ileus.  No evidence of small bowel obstruction   Electronically Signed   By: Julian Hy M.D.   On: 10/24/2014 11:28    Cardiac Studies: EKG monitor on 10/24/2014 @ 0745 reveals a.fib with ventricular rate in the 120s to 130s  EKG 10/19/2014 @ 1804: A. fib with RVR at a ventricular rate of 132 bpm  EKG 10/19/2014 @ 0312: NSR at a rate of 84, LAE, no evidence of ischemia   Assessment/Plan:  1. Paroxysmal a.fib: CHA2DS2-VASCScore: Risk Score 4, Yearly risk of stroke 4.0%. Recommendation: Anticoagulation: Xarelto; [place] Has Bled: Score 2. Estimated risk of major  bleeding at 1 year with [place] 1.88-3.2% 2. HTN 3. DM with CKD III: creatinine has increased slightly to 1.69. CrCl now 45 ml/min 4. HLD 5. Post-op ileus, ischemic transverse and descending colon by colonoscopy this morning, ?fat embolism, doubt A. Fib and cardioembolic phenomena due to patient being on Xarelto previously  Recommendation: patient's A. Fib with RVR episodes are related to underlying metabolic and GI issues.  This is a late entry of my examination done at 9 AM this morning.  I have reviewed the colonoscopy, the bowels also show ischemia.  Concerns for fat embolization from hip fracture cannot be excluded, patient was on anticoagulation prior to surgery for A. Fib, hence cardioembolic phenomena appears  less likely. Patient is presently on IV heparin due to patient being nothing by mouth and on IV Cardizem and metoprolol.  I will not change the dosage of the same as patient is hemodynamically stable.blood pressure is also soft.  Adrian Prows, NP-C 10/24/2014, 6:31 PM [facility] Pager: [phone] Office: [phone]

## 2014-10-25 ENCOUNTER — Inpatient Hospital Stay (HOSPITAL_COMMUNITY): Payer: Medicare Other

## 2014-10-25 ENCOUNTER — Encounter (HOSPITAL_COMMUNITY): Payer: Self-pay | Admitting: Gastroenterology

## 2014-10-25 DIAGNOSIS — N179 Acute kidney failure, unspecified: Secondary | ICD-10-CM

## 2014-10-25 LAB — CBC
HCT: 34.2 % — ABNORMAL LOW (ref 39.0–52.0)
HEMOGLOBIN: 11.6 g/dL — AB (ref 13.0–17.0)
MCH: 31.3 pg (ref 26.0–34.0)
MCHC: 33.9 g/dL (ref 30.0–36.0)
MCV: 92.2 fL (ref 78.0–100.0)
Platelets: 246 10*3/uL (ref 150–400)
RBC: 3.71 MIL/uL — ABNORMAL LOW (ref 4.22–5.81)
RDW: 14.8 % (ref 11.5–15.5)
WBC: 18.5 10*3/uL — ABNORMAL HIGH (ref 4.0–10.5)

## 2014-10-25 LAB — BASIC METABOLIC PANEL
ANION GAP: 5 (ref 5–15)
BUN: 35 mg/dL — ABNORMAL HIGH (ref 6–20)
CALCIUM: 8.4 mg/dL — AB (ref 8.9–10.3)
CO2: 22 mmol/L (ref 22–32)
CREATININE: 1.42 mg/dL — AB (ref 0.61–1.24)
Chloride: 109 mmol/L (ref 101–111)
GFR calc Af Amer: 53 mL/min — ABNORMAL LOW (ref >60)
GFR calc non Af Amer: 46 mL/min — ABNORMAL LOW (ref >60)
Glucose, Bld: 116 mg/dL — ABNORMAL HIGH (ref 65–99)
POTASSIUM: 4.3 mmol/L (ref 3.5–5.1)
SODIUM: 136 mmol/L (ref 135–145)

## 2014-10-25 LAB — GLUCOSE, CAPILLARY
GLUCOSE-CAPILLARY: 102 mg/dL — AB (ref 65–99)
GLUCOSE-CAPILLARY: 86 mg/dL (ref 65–99)
Glucose-Capillary: 104 mg/dL — ABNORMAL HIGH (ref 65–99)
Glucose-Capillary: 112 mg/dL — ABNORMAL HIGH (ref 65–99)
Glucose-Capillary: 122 mg/dL — ABNORMAL HIGH (ref 65–99)
Glucose-Capillary: 89 mg/dL (ref 65–99)
Glucose-Capillary: 92 mg/dL (ref 65–99)

## 2014-10-25 LAB — APTT
APTT: 121 s — AB (ref 24–37)
aPTT: 46 seconds — ABNORMAL HIGH (ref 24–37)

## 2014-10-25 LAB — HEPARIN LEVEL (UNFRACTIONATED): Heparin Unfractionated: 0.62 IU/mL (ref 0.30–0.70)

## 2014-10-25 NOTE — Progress Notes (Addendum)
[facility] for :  Heparin Indication:  atrial fibrillation   Heparin Dosing weight 94 kg   Recent Labs:  10/23/14 0529 10/23/14 1125 10/23/14 2339 10/24/14 0448 10/24/14 0915 10/24/14 1902 10/24/14 2247 10/25/14 0439 10/25/14 0808 10/25/14 1942  HGB 12.3*  --   --  12.9*  --  11.9*  --  11.6*  --   --   HCT 37.3*  --   --  37.9*  --  34.8*  --  34.2*  --   --   PLT 203  --   --  269  --  237  --  246  --   --   APTT  --  37 146*  --  54*  --  34  --  46* 121*  HEPARINUNFRC  --  >2.20*  --   --  1.52*  --   --   --  0.62  --   CREATININE 1.69*  --   --  1.64*  --   --   --  1.42*  --   --    Estimated Creatinine Clearance: 52 mL/min (by C-G formula based on Cr of 1.42).  Current Medication[s] Include: Infusion[s]:3 Infusions:  . sodium chloride    . diltiazem (CARDIZEM) infusion    . heparin 1,450 Units/hr (10/25/14 1228)  . sodium chloride 0.9 % 1,000 mL with potassium chloride 40 mEq infusion 100 mL/hr at 10/24/14 1037    Assessment:  79 y/o male previously on Xarelto for atrial fibrillation.  Due to concern for adequate absorption of Xarelto, anticoagulation changed to Heparin.  Due to a lab interference with Heparin levels by Xarelto, both Heparin levels and aPTT are needed to monitor Heparin until the effects of Xarelto on the lab tests are minimal.  Last Heparin level was 0.62 units/ml but aPTT LOW at 46 seconds.  As a result, the Heparin rate was increased.  APTT 121 seconds which is above the therapeutic range of 66-102 seconds. No evidence of bleeding complications noted   Goal of Therapy:   Heparin level 0.3-0.7 units/ml  APTT 66 - 102 seconds   Plan: 1. Reduce Heparin infusion rate to 1250 units/hr. 2. Will check Heparin level, aPTT, CBC with AM Labs.    Blaine Guiffre, Craig Guess,  Pharm.D  10/25/2014, 8:44 PM     s

## 2014-10-25 NOTE — Progress Notes (Signed)
ANTICOAGULATION CONSULT NOTE - Follow Up Consult  Pharmacy Consult for heparin Indication: atrial fibrillation  No Known Allergies  Patient Measurements: Wt= 101.6kg Ht= 5' 10'' IBW= 73kg Heparin dosing wt: 94kg  Vital Signs: Temp: 98 F (36.7 C) (05/27 0520) Temp Source: Oral (05/27 0520) BP: 133/75 mmHg (05/27 0520) Pulse Rate: 93 (05/27 0520)  Labs:  Recent Labs  10/23/14 0529 10/23/14 1125  10/24/14 0448 10/24/14 0915 10/24/14 1902 10/24/14 2247 10/25/14 0439 10/25/14 0808  HGB 12.3*  --   --  12.9*  --  11.9*  --  11.6*  --   HCT 37.3*  --   --  37.9*  --  34.8*  --  34.2*  --   PLT 203  --   --  269  --  237  --  246  --   APTT  --  37  < >  --  54*  --  34  --  46*  HEPARINUNFRC  --  >2.20*  --   --  1.52*  --   --   --  0.62  CREATININE 1.69*  --   --  1.64*  --   --   --  1.42*  --   < > = values in this interval not displayed.  Estimated Creatinine Clearance: 52 mL/min (by C-G formula based on Cr of 1.42).   Medical History: Past Medical History  Diagnosis Date  . Diabetes mellitus type 2 in obese   . Diabetic neuropathy   . Hypertension   . GERD (gastroesophageal reflux disease)   . Gout   . Left knee DJD   . Right knee DJD   . Bilateral hearing loss     WEARS HEARING AIDS........RIGHT IS BETTER THEN LEFT  . Chronic renal insufficiency, stage III (moderate)     Creatinine 1.47  . Dysrhythmia     h/o A Fib  . On supplemental oxygen therapy     at night  . Ileus, postoperative 10/22/2014  . PONV (postoperative nausea and vomiting)     H/O ....BUT LAST FEW TIMES---NONE    Medications:  Scheduled:  . allopurinol  300 mg Oral q morning - 10a  . erythromycin  250 mg Intravenous 3 times per day  . fluconazole (DIFLUCAN) IV  100 mg Intravenous Q24H  . insulin aspart  0-9 Units Subcutaneous 6 times per day  . insulin glargine  40 Units Subcutaneous QHS  . metoCLOPramide (REGLAN) injection  10 mg Intravenous 4 times per day  . metoprolol  5 mg  Intravenous 4 times per day  . polyethylene glycol  17 g Oral QID  . potassium chloride  40 mEq Oral BID  . rosuvastatin  10 mg Oral QPM   Infusions:  . sodium chloride    . diltiazem (CARDIZEM) infusion    . heparin 1,200 Units/hr (10/24/14 2358)  . sodium chloride 0.9 % 1,000 mL with potassium chloride 40 mEq infusion 100 mL/hr at 10/24/14 1037    Assessment: 79 yo male with afib on xarelto. He is s/p fall with hip fracture and repair on 5/22 and and in now noted with post-op ileus > NPO. There is concern for absorption of po Xarelto and this was discussed with Dr. Olevia Bowens changed to IV heparin.  Initial heparin level is 1.52 (xarelto influence) and aptt is below goal at 56.  Last dose of xarelto was5/24 at about 4pm.  Heparin drip 1200 uts/hr aPTT = 46 sec and HL dropping but still  influenced by Xarelto = 0.6 No bleeding, CBC stable  Goal of Therapy:  Heparin level 0.3-0.7 units/ml aPTT 66-102 seconds Monitor platelets by anticoagulation protocol: Yes   Plan:  -Increase heparin to 1450 units/hr -aPTT in 8 hrs -daily aPTT, heparin level and CBC   Bonnita Nasuti Pharm.D. CPP, BCPS Clinical Pharmacist [phone] 10/25/2014 12:13 PM

## 2014-10-25 NOTE — Progress Notes (Addendum)
Patient ID: Howard Charles, male   DOB: 11/01/1935, 79 y.o.   MRN: [id number]     Subjective:  Patient reports pain as mild.  Patient has no complaints with hip pain very concerned about the bowels.  Objective:   VITALS:   Filed Vitals:   10/24/14 1600 10/24/14 1605 10/24/14 2152 10/25/14 0520  BP: 124/94 116/68 135/76 133/75  Pulse: 85 82 93 93  Temp:   100.2 F (37.9 C) 98 F (36.7 C)  TempSrc:   Axillary Oral  Resp: 21 18 20 20   Height:      Weight:      SpO2: 96% 97% 93% 92%    ABD soft Sensation intact distally Dorsiflexion/Plantar flexion intact Incision: dressing C/D/I and no drainage Good knee and foot function  No sign of infection  Lab Results  Component Value Date   WBC 18.5* 10/25/2014   HGB 11.6* 10/25/2014   HCT 34.2* 10/25/2014   MCV 92.2 10/25/2014   PLT 246 10/25/2014   BMET    Component Value Date/Time   NA 136 10/25/2014 0439   K 4.3 10/25/2014 0439   CL 109 10/25/2014 0439   CO2 22 10/25/2014 0439   GLUCOSE 116* 10/25/2014 0439   BUN 35* 10/25/2014 0439   CREATININE 1.42* 10/25/2014 0439   CALCIUM 8.4* 10/25/2014 0439   GFRNONAA 46* 10/25/2014 0439   GFRAA 53* 10/25/2014 0439     Assessment/Plan: 1 Day Post-Op   Principal Problem:   Closed left hip fracture Active Problems:   Diabetes mellitus type 2 in obese   Hypertension   Chronic renal insufficiency, stage III (moderate)   A-fib   Prostate cancer   Atrial fibrillation with RVR   Ileus, postoperative   Up with therapy Dry dressing prn wbat left lower ext Continue plan per medicine, Gastro., and general surgery. Will continue to follow through the weekend.   Howard Charles 10/25/2014, 8:06 AM  Discussed and agree with above.    Howard Bond, MD Cell [phone]   ADDENDUM:  PATIENT DOES NOT HAVE ALZHEIMER'S.  INCORRECTLY PULLED FORWARD FROM HISTORICAL INACCURATE NOTE.  THIS NOTE HAS BEEN CORRECTED.

## 2014-10-25 NOTE — Progress Notes (Signed)
Was told by previous nurse to hold Cardizem gtt for now, because pt was in NSR. If Cardizem was needed later it could be started then. Monitoring will continue.

## 2014-10-25 NOTE — Progress Notes (Signed)
Utilization review completed.  

## 2014-10-25 NOTE — Progress Notes (Signed)
CSW provided patient and wife with bed offers- pt wife is not sure what facility she would like to go to- is adamant that they go to the facility with the best rehab.  CSW will continue to follow.  Domenica Reamer, [place] Social Worker [phone]

## 2014-10-25 NOTE — Progress Notes (Signed)
[facility] PROGRESS NOTE Interim History: 79 y.o. male  has a past medical history of Diabetes mellitus type 2 in obese; Diabetic neuropathy; Hypertension; GERD  Gout; Left knee DJD; Right knee DJD; PONV  Bilateral hearing loss; Chronic renal insufficiency, stage III (moderate); Dysrhythmia; and On supplemental oxygen therapy. Presented with fall while at the grocery store, Patient lost balance and fell hitting his HEAD and left hip he was not able to get up. EMS was called and he arrived to ER. Plain imaging showed Intertochanteric fracture of the left hip. Patient with hx of A.fib on Xarelto last dose this AM and coreg. On arrival to ER he was tachycardic with HR up to 130 with A.fib he was given a dose of diltiazem and improved with HR down to 80's currently converted to sinus rhythm with heart rate of 70. Develop Ileus, NG tube place, GI consult Colonoscopy done showed small areas of ischemia. CT abd and pelvis showed improved iLeus, lactic acid 1.0.  Assessment/Plan: Ileus, postoperative: - 1 large BM overnight - NG tube and rectal tube to help decompress colon. - Leukocytosis improved. Consulted Gastrenterology's colonoscopy done  - Cont Reglan, change IV fluid to NS with potassium supplemet, and replete mag.  - Keep mag. > 2.0 and K >4.0. - ct abd and pelvis showed Improved ileus and decompress gut.  Atrial fibrillation with RVR: - Cardiology was consulted he was started on IV diltiazem, now in sinus rhythm. Cont IV diltiazem. - As the patient is currently nothing by mouth we'll switch him to IV heparin.  Left closed hip fracture: - Orthopedic surgery was consulted and into her trochanteric IM nailing performed on 10/18/2014. - Physical therapy was consulted which recommended skilled nursing facility.  Alzheimer disease: Very subtle.  Acute on Chronic kidney see stage III: - Most likely prerenal due to decreased oral intake. - start Iv fluids Cr. Cont to improve. Cont  to monitor renal function.  Prostate cancer  Diabetes mellitus type 2 in obese: - cont Lantus plus SSI. - BG well controlled.  Essential  Hypertension - Bp stable.  Code Status: Full Code Family Communication: d/w patient Disposition Plan: remain inpatient   Consultants:  GI  Procedures:  abd x-ray  Colonoscopy 5.26.2016: Multiple short discrete areas of ischemia in the sigmoid descending and transverse with normal bowel in between an air and fluid suctioned as above and no other obvious findings in unprepped exam  CT abd and pelvis 5.26.2016: Decompressed stomach, small bowel and colon following NG tube  insertion and decompression colonoscopy.  Antibiotics:  None  HPI/Subjective: Patient relates he is not nauseated and  abdominal improved,  1 large BM overnight  Objective: Filed Vitals:   10/24/14 1600 10/24/14 1605 10/24/14 2152 10/25/14 0520  BP: 124/94 116/68 135/76 133/75  Pulse: 85 82 93 93  Temp:   100.2 F (37.9 C) 98 F (36.7 C)  TempSrc:   Axillary Oral  Resp: 21 18 20 20   Height:      Weight:      SpO2: 96% 97% 93% 92%    Intake/Output Summary (Last 24 hours) at 10/25/14 0826 Last data filed at 10/25/14 0519  Gross per 24 hour  Intake      0 ml  Output   1150 ml  Net  -1150 ml   Filed Weights   10/24/14 1442  Weight: 101.606 kg (224 lb)    Exam:  General: Alert, awake, oriented x3, in no acute distress.  HEENT: No bruits,  no goiter.  Heart: Regular rate and rhythm. Lungs: Good air movement, bilateral air movement.  Abdomen: Soft, mild diffuse tender, distended, no bowel sounds.  Neuro: Grossly intact, nonfocal.   Data Reviewed: Basic Metabolic Panel:  Recent Labs Lab 10/21/14 0415 10/22/14 0408 10/23/14 0529 10/24/14 0448 10/25/14 0439  NA 137 138 135 137 136  K 4.2 3.6 3.7 3.6 4.3  CL 104 104 105 106 109  CO2 24 24 21* 23 22  GLUCOSE 104* 133* 109* 136* 116*  BUN 18 21* 36* 38* 35*  CREATININE 1.55* 1.37* 1.69*  1.64* 1.42*  CALCIUM 8.6* 9.0 8.7* 8.7* 8.4*  MG  --   --   --  1.9  --   PHOS  --   --   --  2.2*  --    Liver Function Tests: No results for input(s): AST, ALT, ALKPHOS, BILITOT, PROT, ALBUMIN in the last 168 hours. No results for input(s): LIPASE, AMYLASE in the last 168 hours. No results for input(s): AMMONIA in the last 168 hours. CBC:  Recent Labs Lab 10/19/14 1526  10/22/14 0408 10/23/14 0529 10/24/14 0448 10/24/14 1902 10/25/14 0439  WBC 10.9*  < > 14.4* 17.2* 24.1* 19.1* 18.5*  NEUTROABS 7.2  --   --   --   --  16.2*  --   HGB 15.2  < > 12.8* 12.3* 12.9* 11.9* 11.6*  HCT 44.4  < > 37.2* 37.3* 37.9* 34.8* 34.2*  MCV 93.3  < > 92.3 93.3 92.2 92.3 92.2  PLT 196  < > 176 203 269 237 246  < > = values in this interval not displayed. Cardiac Enzymes: No results for input(s): CKTOTAL, CKMB, CKMBINDEX, TROPONINI in the last 168 hours. BNP (last 3 results) No results for input(s): BNP in the last 8760 hours.  ProBNP (last 3 results) No results for input(s): PROBNP in the last 8760 hours.  CBG:  Recent Labs Lab 10/24/14 1709 10/24/14 2151 10/25/14 0003 10/25/14 0525 10/25/14 0804  GLUCAP 121* 101* 112* 122* 104*    Recent Results (from the past 240 hour(s))  Urine culture     Status: None   Collection Time: 10/20/14  1:34 AM  Result Value Ref Range Status   Specimen Description URINE, CATHETERIZED  Final   Special Requests NONE  Final   Colony Count NO GROWTH Performed at [facility]   Final   Culture NO GROWTH Performed at [facility]   Final   Report Status 10/21/2014 FINAL  Final  MRSA PCR Screening     Status: None   Collection Time: 10/20/14  4:20 AM  Result Value Ref Range Status   MRSA by PCR NEGATIVE NEGATIVE Final    Comment:        The GeneXpert MRSA Assay (FDA approved for NASAL specimens only), is one component of a comprehensive MRSA colonization surveillance program. It is not intended to diagnose MRSA infection nor  to guide or monitor treatment for MRSA infections.      Studies: Ct Abdomen Pelvis Wo Contrast  10/24/2014   CLINICAL DATA:  Diffuse abdominal pain for 3 days, recent ileus, status post decompression colonoscopy earlier today  EXAM: CT ABDOMEN AND PELVIS WITHOUT CONTRAST  TECHNIQUE: Multidetector CT imaging of the abdomen and pelvis was performed following the standard protocol without IV contrast.  COMPARISON:  10/24/2014 plain radiographs, 02/01/2006 CT abdomen with contrast  FINDINGS: Lower chest: Right base partial collapse/consolidation evident. Minor left base atelectasis. Mild cardiomegaly. Small hiatal hernia  evident. No pericardial or pleural effusion.  Abdomen: Limited exam without IV or oral contrast. NG tube within the distal stomach body. Stomach, small bowel and colon are decompressed following the decompression colonoscopy and NG tube insertion. No current significant dilatation, obstruction or ileus. Colon has some areas of scattered retained fluid with small air-fluid levels. No free air.  Prior cholecystectomy noted. Liver, biliary system, fatty replaced pancreas, spleen, and adrenal glands are within normal limits for age and noncontrast imaging. Kidneys demonstrate no renal obstruction or hydronephrosis. Left lower pole renal cyst suspected measuring 3.3 cm, image 53.  No abdominal hemorrhage, fluid collection, abscess, or adenopathy.  Aortic atherosclerosis noted without aneurysm or retroperitoneal hemorrhage.  Small amount of right lower quadrant stranding mesenteric edema and free fluid, images 71 through 87 without clear etiology. Appendix is not visualized.  Small fat containing umbilical hernia.  Pelvis: Postop changes in the pelvis prior prostatectomy and lymph node dissection. Urinary bladder unremarkable. No pelvic fluid collection, hemorrhage, abscess, or adenopathy. No inguinal abnormality or significant hernia. Left hip fixation hardware creates artifact. Postop changes of the  left hip region.  Left hip fracture again demonstrated, status post operative fixation. Degenerative changes of the spine. No other acute osseous finding.  IMPRESSION: Decompressed stomach, small bowel and colon following NG tube insertion and decompression colonoscopy.  Colon appears elongated and redundant with areas containing residual fluid with air-fluid levels but no significant recurrent ileus at this time.  Negative for abscess or free air  Nonspecific mesenteric stranding edema and a small amount of free fluid in the right lower quadrant without clear etiology possibly related to the recent colonic ileus.  Appendix not visualized.  Postop changes in the pelvis and left hip region   Electronically Signed   By: Jerilynn Mages.  Shick M.D.   On: 10/24/2014 20:59   Dg Abd 1 View  10/24/2014   CLINICAL DATA:  Distention  EXAM: ABDOMEN - 1 VIEW  COMPARISON:  10/22/2014  FINDINGS: Gaseous distention of the ascending and transverse colon.  Left colon is relatively decompressed.  No evidence of small bowel obstruction.  Enteric tube in the distal stomach.  Cholecystectomy clips.  Surgical clips in the pelvis.  Status post ORIF of the left hip.  IMPRESSION: Gaseous distention of the right colon, suggesting adynamic colonic ileus.  No evidence of small bowel obstruction   Electronically Signed   By: Julian Hy M.D.   On: 10/24/2014 11:28    Scheduled Meds: . allopurinol  300 mg Oral q morning - 10a  . erythromycin  250 mg Intravenous 3 times per day  . fluconazole (DIFLUCAN) IV  100 mg Intravenous Q24H  . insulin aspart  0-9 Units Subcutaneous 6 times per day  . insulin glargine  40 Units Subcutaneous QHS  . metoCLOPramide (REGLAN) injection  10 mg Intravenous 4 times per day  . metoprolol  5 mg Intravenous 4 times per day  . polyethylene glycol  17 g Oral QID  . potassium chloride  40 mEq Oral BID  . rosuvastatin  10 mg Oral QPM   Continuous Infusions: . sodium chloride    . diltiazem (CARDIZEM)  infusion    . heparin 1,200 Units/hr (10/24/14 2358)  . sodium chloride 0.9 % 1,000 mL with potassium chloride 40 mEq infusion 100 mL/hr at 10/24/14 1037    Time Spent: 25 min   Charlynne Cousins  [facility] Pager [phone]. If 7PM-7AM, please contact night-coverage at [email or web address], password [facility] 10/25/2014, 8:26 AM  LOS:  6 days

## 2014-10-25 NOTE — Progress Notes (Signed)
1 Day Post-Op  Subjective: NG regulator not reading pressure, nothing recently recorded.  Sump filter not working.  No pain feels better no flatus so far.  Film ordered but not done yet.  Objective: Vital signs in last 24 hours: Temp:  [97.9 F (36.6 C)-100.2 F (37.9 C)] 98 F (36.7 C) (05/27 0520) Pulse Rate:  [76-93] 93 (05/27 0520) Resp:  [17-32] 20 (05/27 0520) BP: (86-135)/(48-94) 133/75 mmHg (05/27 0520) SpO2:  [90 %-97 %] 92 % (05/27 0520) Weight:  [101.606 kg (224 lb)] 101.606 kg (224 lb) (05/26 1442) Last BM Date: 10/24/14 NPO 400 from NG Afebrile, VSS WBC still up 18.5 K, creatinine is a little better.  K+ 4.3. NO film Intake/Output from previous day: 05/26 0701 - 05/27 0700 In: 0  Out: 1150 [Urine:750; Emesis/NG output:400] Intake/Output this shift:    General appearance: alert, cooperative and no distress GI: large distended, bowel sounds hypoactive.  Not really tender.  Lab Results:   Recent Labs  10/24/14 1902 10/25/14 0439  WBC 19.1* 18.5*  HGB 11.9* 11.6*  HCT 34.8* 34.2*  PLT 237 246    BMET  Recent Labs  10/24/14 0448 10/25/14 0439  NA 137 136  K 3.6 4.3  CL 106 109  CO2 23 22  GLUCOSE 136* 116*  BUN 38* 35*  CREATININE 1.64* 1.42*  CALCIUM 8.7* 8.4*   PT/INR No results for input(s): LABPROT, INR in the last 72 hours.  No results for input(s): AST, ALT, ALKPHOS, BILITOT, PROT, ALBUMIN in the last 168 hours.   Lipase  No results found for: LIPASE   Studies/Results: Ct Abdomen Pelvis Wo Contrast  10/24/2014   CLINICAL DATA:  Diffuse abdominal pain for 3 days, recent ileus, status post decompression colonoscopy earlier today  EXAM: CT ABDOMEN AND PELVIS WITHOUT CONTRAST  TECHNIQUE: Multidetector CT imaging of the abdomen and pelvis was performed following the standard protocol without IV contrast.  COMPARISON:  10/24/2014 plain radiographs, 02/01/2006 CT abdomen with contrast  FINDINGS: Lower chest: Right base partial  collapse/consolidation evident. Minor left base atelectasis. Mild cardiomegaly. Small hiatal hernia evident. No pericardial or pleural effusion.  Abdomen: Limited exam without IV or oral contrast. NG tube within the distal stomach body. Stomach, small bowel and colon are decompressed following the decompression colonoscopy and NG tube insertion. No current significant dilatation, obstruction or ileus. Colon has some areas of scattered retained fluid with small air-fluid levels. No free air.  Prior cholecystectomy noted. Liver, biliary system, fatty replaced pancreas, spleen, and adrenal glands are within normal limits for age and noncontrast imaging. Kidneys demonstrate no renal obstruction or hydronephrosis. Left lower pole renal cyst suspected measuring 3.3 cm, image 53.  No abdominal hemorrhage, fluid collection, abscess, or adenopathy.  Aortic atherosclerosis noted without aneurysm or retroperitoneal hemorrhage.  Small amount of right lower quadrant stranding mesenteric edema and free fluid, images 71 through 87 without clear etiology. Appendix is not visualized.  Small fat containing umbilical hernia.  Pelvis: Postop changes in the pelvis prior prostatectomy and lymph node dissection. Urinary bladder unremarkable. No pelvic fluid collection, hemorrhage, abscess, or adenopathy. No inguinal abnormality or significant hernia. Left hip fixation hardware creates artifact. Postop changes of the left hip region.  Left hip fracture again demonstrated, status post operative fixation. Degenerative changes of the spine. No other acute osseous finding.  IMPRESSION: Decompressed stomach, small bowel and colon following NG tube insertion and decompression colonoscopy.  Colon appears elongated and redundant with areas containing residual fluid with air-fluid levels but  no significant recurrent ileus at this time.  Negative for abscess or free air  Nonspecific mesenteric stranding edema and a small amount of free fluid in the  right lower quadrant without clear etiology possibly related to the recent colonic ileus.  Appendix not visualized.  Postop changes in the pelvis and left hip region   Electronically Signed   By: Jerilynn Mages.  Shick M.D.   On: 10/24/2014 20:59   Dg Abd 1 View  10/24/2014   CLINICAL DATA:  Distention  EXAM: ABDOMEN - 1 VIEW  COMPARISON:  10/22/2014  FINDINGS: Gaseous distention of the ascending and transverse colon.  Left colon is relatively decompressed.  No evidence of small bowel obstruction.  Enteric tube in the distal stomach.  Cholecystectomy clips.  Surgical clips in the pelvis.  Status post ORIF of the left hip.  IMPRESSION: Gaseous distention of the right colon, suggesting adynamic colonic ileus.  No evidence of small bowel obstruction   Electronically Signed   By: Julian Hy M.D.   On: 10/24/2014 11:28    Medications: . allopurinol  300 mg Oral q morning - 10a  . erythromycin  250 mg Intravenous 3 times per day  . fluconazole (DIFLUCAN) IV  100 mg Intravenous Q24H  . insulin aspart  0-9 Units Subcutaneous 6 times per day  . insulin glargine  40 Units Subcutaneous QHS  . metoCLOPramide (REGLAN) injection  10 mg Intravenous 4 times per day  . metoprolol  5 mg Intravenous 4 times per day  . polyethylene glycol  17 g Oral QID  . potassium chloride  40 mEq Oral BID  . rosuvastatin  10 mg Oral QPM   . sodium chloride    . diltiazem (CARDIZEM) infusion    . heparin 1,200 Units/hr (10/24/14 2358)  . sodium chloride 0.9 % 1,000 mL with potassium chloride 40 mEq infusion 100 mL/hr at 10/24/14 1037    Assessment/Plan Abdominal distension secondary to colonic ileus Colonoscopy, diagnostic with decompression 10/24/14, Dr. Watt Climes Closed hip fx s/p left INTRAMEDULLARY (IM) NAIL INTERTROCHANTRIC, 10/20/14, Dr. [name] PAF with RVR Hypertension AODM Stage III renal disease Alzheimer's disease Antibiotics:  Erythromycin day 2, diflucan day 3 DVT:  SCD/Heparin drip.  Plan No acute surgical  need give him time for ileus to improve.  Film pending for today and tomorrow       LOS: 6 days    Saydi Kobel 10/25/2014

## 2014-10-25 NOTE — Progress Notes (Signed)
10/25/2014 5:34 PM Nursing note Pt. Voicing concerns about CBG ranging from 90-100 today. Pt. States that his blood sugars at home can very quickly fluctuate from high to low. Pt. Requesting dextrose be added to his IVF to prevent CBG drops during the night tonight. Dr. Olevia Bowens paged and made aware. Telephone Verbal order received to d/c SSI and MD to place new IVF orders. Pt. And family updated on plan of care. Will continue to monitor patient.  Yanissa Michalsky, Arville Lime

## 2014-10-25 NOTE — Progress Notes (Signed)
Decompressive colonoscopy yesterday moderately successful; it did show some changes c/w mucosal ischemia.  CT last night did not show any frank ischemic colitis, but did confirm successful decompression.  KUB not obtained this morning--have ordered one.  Pt denies abd pain.  Potassium has come up nicely to 4.3.  WBC is still elevated but coming down.  EXAM:  NAD, abd still distended/obese and moderately severely tympanitic on the right side, bowel sounds present (sparse).  IMPR:    1.  Ileus (small and large bowel) probably resolving 2.  Hypokalemia--resolved 3.  Mucosal ischemia (? 2/2 distension, or vice versa???)   PLAN:  1.  Cont to monitor labs 2.  Cont to monitor KUB 3.  Cont K+ supple 4.  Hope that pt will not need another decompression procedure, but will be on standby.  Cleotis Nipper, M.D. Pager [phone] If no answer or after 5 PM call [phone]

## 2014-10-25 NOTE — Progress Notes (Addendum)
Subjective:  Pt resting comfortably, NG tube remains in place. Monitor reveals paroxysmal a.fib with episodes of RVR, although much less frequent. Last episode at 4am lasting just a few minutes, presently in NSR.  Denies any chest pain, SOB, or palpitations. Wife present at bedside.  Objective:  Vital Signs in the last 24 hours: Temp:  [97.9 F (36.6 C)-100.2 F (37.9 C)] 98 F (36.7 C) (05/27 0520) Pulse Rate:  [76-93] 93 (05/27 0520) Resp:  [17-32] 20 (05/27 0520) BP: (86-135)/(48-94) 133/75 mmHg (05/27 0520) SpO2:  [90 %-97 %] 92 % (05/27 0520) Weight:  [101.606 kg (224 lb)] 101.606 kg (224 lb) (05/26 1442)  Intake/Output from previous day: 05/26 0701 - 05/27 0700 In: 0  Out: 1150 [Urine:750; Emesis/NG output:400]  Physical Exam:   General appearance: alert, cooperative, appears stated age and moderately obese No distress. Neck: no adenopathy, no carotid bruit, no JVD, supple, symmetrical, trachea midline and thyroid not enlarged, symmetric, no tenderness/mass/nodules Resp: clear to auscultation bilaterally Chest wall: no tenderness Cardio: regular rate and rhythm, S1, S2 normal, no murmur, click, rub or gallop GI: abnormal findings:  distended, hypoactive bowel sounds and obese Extremities: extremities normal, atraumatic, no cyanosis or edema    Lab Results: BMP  Recent Labs  10/23/14 0529 10/24/14 0448 10/25/14 0439  NA 135 137 136  K 3.7 3.6 4.3  CL 105 106 109  CO2 21* 23 22  GLUCOSE 109* 136* 116*  BUN 36* 38* 35*  CREATININE 1.69* 1.64* 1.42*  CALCIUM 8.7* 8.7* 8.4*  GFRNONAA 37* 39* 46*  GFRAA 43* 45* 53*    CBC  Recent Labs Lab 10/24/14 1902 10/25/14 0439  WBC 19.1* 18.5*  RBC 3.77* 3.71*  HGB 11.9* 11.6*  HCT 34.8* 34.2*  PLT 237 246  MCV 92.3 92.2  MCH 31.6 31.3  MCHC 34.2 33.9  RDW 14.6 14.8  LYMPHSABS 1.6  --   MONOABS 1.3*  --   EOSABS 0.0  --   BASOSABS 0.0  --     HEMOGLOBIN A1C Lab Results  Component Value Date   HGBA1C  7.7* 10/22/2014   MPG 174 10/22/2014     Ct Abdomen Pelvis Wo Contrast  10/24/2014   CLINICAL DATA:  Diffuse abdominal pain for 3 days, recent ileus, status post decompression colonoscopy earlier today  EXAM: CT ABDOMEN AND PELVIS WITHOUT CONTRAST  TECHNIQUE: Multidetector CT imaging of the abdomen and pelvis was performed following the standard protocol without IV contrast.  COMPARISON:  10/24/2014 plain radiographs, 02/01/2006 CT abdomen with contrast  FINDINGS: Lower chest: Right base partial collapse/consolidation evident. Minor left base atelectasis. Mild cardiomegaly. Small hiatal hernia evident. No pericardial or pleural effusion.  Abdomen: Limited exam without IV or oral contrast. NG tube within the distal stomach body. Stomach, small bowel and colon are decompressed following the decompression colonoscopy and NG tube insertion. No current significant dilatation, obstruction or ileus. Colon has some areas of scattered retained fluid with small air-fluid levels. No free air.  Prior cholecystectomy noted. Liver, biliary system, fatty replaced pancreas, spleen, and adrenal glands are within normal limits for age and noncontrast imaging. Kidneys demonstrate no renal obstruction or hydronephrosis. Left lower pole renal cyst suspected measuring 3.3 cm, image 53.  No abdominal hemorrhage, fluid collection, abscess, or adenopathy.  Aortic atherosclerosis noted without aneurysm or retroperitoneal hemorrhage.  Small amount of right lower quadrant stranding mesenteric edema and free fluid, images 71 through 87 without clear etiology. Appendix is not visualized.  Small fat containing umbilical hernia.  Pelvis: Postop changes in the pelvis prior prostatectomy and lymph node dissection. Urinary bladder unremarkable. No pelvic fluid collection, hemorrhage, abscess, or adenopathy. No inguinal abnormality or significant hernia. Left hip fixation hardware creates artifact. Postop changes of the left hip region.  Left  hip fracture again demonstrated, status post operative fixation. Degenerative changes of the spine. No other acute osseous finding.  IMPRESSION: Decompressed stomach, small bowel and colon following NG tube insertion and decompression colonoscopy.  Colon appears elongated and redundant with areas containing residual fluid with air-fluid levels but no significant recurrent ileus at this time.  Negative for abscess or free air  Nonspecific mesenteric stranding edema and a small amount of free fluid in the right lower quadrant without clear etiology possibly related to the recent colonic ileus.  Appendix not visualized.  Postop changes in the pelvis and left hip region   Electronically Signed   By: Jerilynn Mages.  Shick M.D.   On: 10/24/2014 20:59   Dg Abd 1 View  10/24/2014   CLINICAL DATA:  Distention  EXAM: ABDOMEN - 1 VIEW  COMPARISON:  10/22/2014  FINDINGS: Gaseous distention of the ascending and transverse colon.  Left colon is relatively decompressed.  No evidence of small bowel obstruction.  Enteric tube in the distal stomach.  Cholecystectomy clips.  Surgical clips in the pelvis.  Status post ORIF of the left hip.  IMPRESSION: Gaseous distention of the right colon, suggesting adynamic colonic ileus.  No evidence of small bowel obstruction   Electronically Signed   By: Julian Hy M.D.   On: 10/24/2014 11:28    Cardiac Studies: EKG monitor on 10/25/2014 @ 0745 reveals NSR at a rate of 70bpm  EKG 10/19/2014 @ 1804: A. fib with RVR at a ventricular rate of 132 bpm  EKG 10/19/2014 @ 0312: NSR at a rate of 84, LAE, no evidence of ischemia   Assessment/Plan:  1. Paroxysmal a.fib: CHA2DS2-VASCScore: Risk Score 4, Yearly risk of stroke 4.0%. Recommendation: Anticoagulation: Xarelto; [place] Has Bled: Score 2. Estimated risk of major bleeding at 1 year with [place] 1.88-3.2% 2. HTN 3. DM with CKD III: creatinine has increased slightly to 1.69. CrCl now 45 ml/min 4. HLD 5. Post-op ileus, ischemic transverse and  descending colon by colonoscopy, ?fat embolism, doubt A. Fib and cardioembolic phenomena due to patient being on Xarelto previously  Recommendation: patient's A. Fib with RVR episodes are related to underlying metabolic and GI issues. Patient is presently on IV heparin due to patient being nothing by mouth and on IV Cardizem and metoprolol.  Per nursing notes, cardizem gtt has currently been held due to maintenance of sinus rhythm and possibly due to soft BP.   Rachel Bo, NP-C 10/25/2014, 7:53 AM [facility] Pager: [phone] Office: [phone]

## 2014-10-25 NOTE — Progress Notes (Signed)
Physical Therapy Treatment Patient Details Name: Howard Charles MRN: [id number] DOB: [birth year] Today's Date: 10/25/2014    History of Present Illness Pt is a 79 y/o M s/p fall and L hip fx.  Pt's PMH includes DM, diabetic neuropathy, gout, HTN, B TKA, B hearing loss, chronic renal insufficiency, on supplemental O2 at night, a fib, rotator cuff arthroscopy.    PT Comments    Pt progressing towards physical therapy goals. Was able to perform transfers with +2 assist for balance support and line management (NG tube connected to suction). Therapist provided education for pt/wife as well as nursing staff for safe transfer to [facility] and then to recliner. Will continue to follow.   Follow Up Recommendations  SNF;Supervision for mobility/OOB     Equipment Recommendations  Rolling walker with 5" wheels    Recommendations for Other Services       Precautions / Restrictions Precautions Precautions: Fall Restrictions Weight Bearing Restrictions: Yes LLE Weight Bearing: Weight bearing as tolerated    Mobility  Bed Mobility Overal bed mobility: Needs Assistance Bed Mobility: Supine to Sit     Supine to sit: Min assist;+2 for physical assistance     General bed mobility comments: Assist w/ managing BLEs and using bed pad to bring hips to sitting EOB.  Pt w/ heavy use of bed rails and required max verbal cues during supine<>sit.    Transfers Overall transfer level: Needs assistance Equipment used: Rolling walker (2 wheeled) Transfers: Sit to/from [facility] Sit to Stand: Min assist;+2 physical assistance Stand pivot transfers: Min assist;+2 safety/equipment       General transfer comment: Assist to power-up to full standing position. Increased time to gain and maintain balance prior to initiating pivotal steps around to the [facility], and then recliner.   Ambulation/Gait                 Stairs            Wheelchair Mobility    Modified Rankin (Stroke  Patients Only)       Balance Overall balance assessment: Needs assistance;History of Falls Sitting-balance support: Feet supported;No upper extremity supported Sitting balance-Leahy Scale: Fair     Standing balance support: Bilateral upper extremity supported Standing balance-Leahy Scale: Poor                      Cognition Arousal/Alertness: Awake/alert Behavior During Therapy: WFL for tasks assessed/performed Overall Cognitive Status: History of cognitive impairments - at baseline                      Exercises      General Comments        Pertinent Vitals/Pain Pain Assessment: Faces Faces Pain Scale: Hurts little more Pain Location: Abdomen more than LLE Pain Descriptors / Indicators: Aching Pain Intervention(s): Limited activity within patient's tolerance;Monitored during session;Repositioned    Home Living                      Prior Function            PT Goals (current goals can now be found in the care plan section) Acute Rehab PT Goals Patient Stated Goal: to get rid of some of this pain. PT Goal Formulation: With patient Time For Goal Achievement: 10/28/14 Potential to Achieve Goals: Good Progress towards PT goals: Progressing toward goals    Frequency  Min 3X/week    PT Plan Current plan remains appropriate  Co-evaluation             End of Session Equipment Utilized During Treatment: Gait belt Activity Tolerance: Patient limited by pain Patient left: in chair;with call bell/phone within reach;with family/visitor present     Time: 7014-1030 PT Time Calculation (min) (ACUTE ONLY): 25 min  Charges:  $Therapeutic Activity: 23-37 mins                    G Codes:      Rolinda Roan Oct 27, 2014, 12:59 PM   Rolinda Roan, PT, DPT Acute Rehabilitation Services Pager: [phone]

## 2014-10-25 NOTE — Progress Notes (Signed)
Medicare Important Message given? Yes (If Response is "NO", the following Medicare IM given date fields will be blank) Date Medicare IM given:  10/25/14 Medicare IM given by: Tarynn Garling 

## 2014-10-26 ENCOUNTER — Inpatient Hospital Stay (HOSPITAL_COMMUNITY): Payer: Medicare Other

## 2014-10-26 LAB — GLUCOSE, CAPILLARY
GLUCOSE-CAPILLARY: 105 mg/dL — AB (ref 65–99)
GLUCOSE-CAPILLARY: 101 mg/dL — AB (ref 65–99)
GLUCOSE-CAPILLARY: 127 mg/dL — AB (ref 65–99)
Glucose-Capillary: 89 mg/dL (ref 65–99)

## 2014-10-26 LAB — BASIC METABOLIC PANEL
ANION GAP: 8 (ref 5–15)
BUN: 30 mg/dL — ABNORMAL HIGH (ref 6–20)
CO2: 20 mmol/L — AB (ref 22–32)
Calcium: 8.7 mg/dL — ABNORMAL LOW (ref 8.9–10.3)
Chloride: 111 mmol/L (ref 101–111)
Creatinine, Ser: 1.32 mg/dL — ABNORMAL HIGH (ref 0.61–1.24)
GFR calc non Af Amer: 50 mL/min — ABNORMAL LOW (ref >60)
GFR, EST AFRICAN AMERICAN: 58 mL/min — AB (ref >60)
GLUCOSE: 86 mg/dL (ref 65–99)
Potassium: 4.7 mmol/L (ref 3.5–5.1)
Sodium: 139 mmol/L (ref 135–145)

## 2014-10-26 LAB — CBC
HCT: 33.7 % — ABNORMAL LOW (ref 39.0–52.0)
Hemoglobin: 11.3 g/dL — ABNORMAL LOW (ref 13.0–17.0)
MCH: 30.9 pg (ref 26.0–34.0)
MCHC: 33.5 g/dL (ref 30.0–36.0)
MCV: 92.1 fL (ref 78.0–100.0)
PLATELETS: 282 10*3/uL (ref 150–400)
RBC: 3.66 MIL/uL — AB (ref 4.22–5.81)
RDW: 14.6 % (ref 11.5–15.5)
WBC: 19.1 10*3/uL — ABNORMAL HIGH (ref 4.0–10.5)

## 2014-10-26 LAB — MAGNESIUM: Magnesium: 2 mg/dL (ref 1.7–2.4)

## 2014-10-26 LAB — HEPARIN LEVEL (UNFRACTIONATED): Heparin Unfractionated: 0.57 IU/mL (ref 0.30–0.70)

## 2014-10-26 LAB — CLOSTRIDIUM DIFFICILE BY PCR: Toxigenic C. Difficile by PCR: NEGATIVE

## 2014-10-26 LAB — APTT: APTT: 81 s — AB (ref 24–37)

## 2014-10-26 MED ORDER — SODIUM BICARBONATE 8.4 % IV SOLN
INTRAVENOUS | Status: DC
  Administered 2014-10-26 – 2014-10-28 (×3): via INTRAVENOUS
  Filled 2014-10-26 (×5): qty 1000

## 2014-10-26 MED ORDER — PHENOL 1.4 % MT LIQD: 1.0000 | OROMUCOSAL | Status: DC | PRN

## 2014-10-26 NOTE — Progress Notes (Signed)
[facility] PROGRESS NOTE Interim History: 79 y.o. male  has a past medical history of Diabetes mellitus type 2 in obese; Diabetic neuropathy; Hypertension; GERD  Gout; Left knee DJD; Right knee DJD; PONV  Bilateral hearing loss; Chronic renal insufficiency, stage III (moderate); Dysrhythmia; and On supplemental oxygen therapy. Presented with fall while at the grocery store, Patient lost balance and fell hitting his HEAD and left hip he was not able to get up. EMS was called and he arrived to ER. Plain imaging showed Intertochanteric fracture of the left hip. Patient with hx of A.fib on Xarelto last dose this AM and coreg. On arrival to ER he was tachycardic with HR up to 130 with A.fib he was given a dose of diltiazem and improved with HR down to 80's currently converted to sinus rhythm with heart rate of 70. Develop Ileus, NG tube place, GI consult Colonoscopy done showed small areas of ischemia. CT abd and pelvis showed improved iLeus, lactic acid 1.0.  Assessment/Plan: Ileus, postoperative: - His head multiple bowel movements last 24 hours, his NG output has decreased to 270. - We can probably clamp the NG tube and continue ice chips. - Leukocytosis has not improved compared to yesterday which had a C. difficile PCR. - Cont Reglan, change IV fluid to D5 with HCO. - Keep mag. > 2.0 and K >4.0. - Abd x-ray is pending.  Atrial fibrillation with RVR: - Cardiology was consulted he was started on IV diltiazem, now in sinus rhythm. Cont IV diltiazem. - As the patient is currently nothing by mouth we'll switch him to IV heparin. - hbg remaines stable  Left closed hip fracture: - Orthopedic surgery was consulted and into her trochanteric IM nailing performed on 10/18/2014. - Physical therapy was consulted which recommended skilled nursing facility.  Alzheimer disease: Very subtle.  Acute on Chronic kidney see stage III: - Most likely prerenal due to decreased oral intake. - Cont  to improve with IV hydration.  Prostate cancer  Diabetes mellitus type 2 in obese: - cont Lantus plus SSI. - BG well controlled.  Essential  Hypertension - Bp stable.  Code Status: Full Code Family Communication: d/w patient Disposition Plan: remain inpatient   Consultants:  GI  Procedures:  abd x-ray  Colonoscopy 5.26.2016: Multiple short discrete areas of ischemia in the sigmoid descending and transverse with normal bowel in between an air and fluid suctioned as above and no other obvious findings in unprepped exam  CT abd and pelvis 5.26.2016: Decompressed stomach, small bowel and colon following NG tube  insertion and decompression colonoscopy.  Antibiotics:  None  HPI/Subjective: Patient relates he is not nauseated and  abdominal improved,  Had 5 BM in 24 hrs period.  Objective: Filed Vitals:   10/25/14 1345 10/25/14 2006 10/26/14 0003 10/26/14 0413  BP: 125/77 149/72 141/75 145/92  Pulse: 90 86 81 79  Temp: 98.2 F (36.8 C) 98.5 F (36.9 C)  98.1 F (36.7 C)  TempSrc: Oral Oral  Oral  Resp: 18 18  18   Height:      Weight:      SpO2: 94% 95%  94%    Intake/Output Summary (Last 24 hours) at 10/26/14 0819 Last data filed at 10/26/14 0656  Gross per 24 hour  Intake      0 ml  Output   1496 ml  Net  -1496 ml   Filed Weights   10/24/14 1442  Weight: 101.606 kg (224 lb)    Exam:  General:  Alert, awake, oriented x3, in no acute distress.  HEENT: No bruits, no goiter.  Heart: Regular rate and rhythm. Lungs: Good air movement, bilateral air movement.  Abdomen: Soft, no tenderness, lessdistended, no bowel sounds.  Neuro: Grossly intact, nonfocal.   Data Reviewed: Basic Metabolic Panel:  Recent Labs Lab 10/22/14 0408 10/23/14 0529 10/24/14 0448 10/25/14 0439 10/26/14 0305  NA 138 135 137 136 139  K 3.6 3.7 3.6 4.3 4.7  CL 104 105 106 109 111  CO2 24 21* 23 22 20*  GLUCOSE 133* 109* 136* 116* 86  BUN 21* 36* 38* 35* 30*  CREATININE  1.37* 1.69* 1.64* 1.42* 1.32*  CALCIUM 9.0 8.7* 8.7* 8.4* 8.7*  MG  --   --  1.9  --   --   PHOS  --   --  2.2*  --   --    Liver Function Tests: No results for input(s): AST, ALT, ALKPHOS, BILITOT, PROT, ALBUMIN in the last 168 hours. No results for input(s): LIPASE, AMYLASE in the last 168 hours. No results for input(s): AMMONIA in the last 168 hours. CBC:  Recent Labs Lab 10/19/14 1526  10/23/14 0529 10/24/14 0448 10/24/14 1902 10/25/14 0439 10/26/14 0305  WBC 10.9*  < > 17.2* 24.1* 19.1* 18.5* 19.1*  NEUTROABS 7.2  --   --   --  16.2*  --   --   HGB 15.2  < > 12.3* 12.9* 11.9* 11.6* 11.3*  HCT 44.4  < > 37.3* 37.9* 34.8* 34.2* 33.7*  MCV 93.3  < > 93.3 92.2 92.3 92.2 92.1  PLT 196  < > 203 269 237 246 282  < > = values in this interval not displayed. Cardiac Enzymes: No results for input(s): CKTOTAL, CKMB, CKMBINDEX, TROPONINI in the last 168 hours. BNP (last 3 results) No results for input(s): BNP in the last 8760 hours.  ProBNP (last 3 results) No results for input(s): PROBNP in the last 8760 hours.  CBG:  Recent Labs Lab 10/25/14 1111 10/25/14 1604 10/25/14 2004 10/25/14 2351 10/26/14 0404  GLUCAP 102* 92 89 86 89    Recent Results (from the past 240 hour(s))  Urine culture     Status: None   Collection Time: 10/20/14  1:34 AM  Result Value Ref Range Status   Specimen Description URINE, CATHETERIZED  Final   Special Requests NONE  Final   Colony Count NO GROWTH Performed at [facility]   Final   Culture NO GROWTH Performed at [facility]   Final   Report Status 10/21/2014 FINAL  Final  MRSA PCR Screening     Status: None   Collection Time: 10/20/14  4:20 AM  Result Value Ref Range Status   MRSA by PCR NEGATIVE NEGATIVE Final    Comment:        The GeneXpert MRSA Assay (FDA approved for NASAL specimens only), is one component of a comprehensive MRSA colonization surveillance program. It is not intended to diagnose  MRSA infection nor to guide or monitor treatment for MRSA infections.      Studies: Ct Abdomen Pelvis Wo Contrast  10/24/2014   CLINICAL DATA:  Diffuse abdominal pain for 3 days, recent ileus, status post decompression colonoscopy earlier today  EXAM: CT ABDOMEN AND PELVIS WITHOUT CONTRAST  TECHNIQUE: Multidetector CT imaging of the abdomen and pelvis was performed following the standard protocol without IV contrast.  COMPARISON:  10/24/2014 plain radiographs, 02/01/2006 CT abdomen with contrast  FINDINGS: Lower chest: Right base partial  collapse/consolidation evident. Minor left base atelectasis. Mild cardiomegaly. Small hiatal hernia evident. No pericardial or pleural effusion.  Abdomen: Limited exam without IV or oral contrast. NG tube within the distal stomach body. Stomach, small bowel and colon are decompressed following the decompression colonoscopy and NG tube insertion. No current significant dilatation, obstruction or ileus. Colon has some areas of scattered retained fluid with small air-fluid levels. No free air.  Prior cholecystectomy noted. Liver, biliary system, fatty replaced pancreas, spleen, and adrenal glands are within normal limits for age and noncontrast imaging. Kidneys demonstrate no renal obstruction or hydronephrosis. Left lower pole renal cyst suspected measuring 3.3 cm, image 53.  No abdominal hemorrhage, fluid collection, abscess, or adenopathy.  Aortic atherosclerosis noted without aneurysm or retroperitoneal hemorrhage.  Small amount of right lower quadrant stranding mesenteric edema and free fluid, images 71 through 87 without clear etiology. Appendix is not visualized.  Small fat containing umbilical hernia.  Pelvis: Postop changes in the pelvis prior prostatectomy and lymph node dissection. Urinary bladder unremarkable. No pelvic fluid collection, hemorrhage, abscess, or adenopathy. No inguinal abnormality or significant hernia. Left hip fixation hardware creates artifact.  Postop changes of the left hip region.  Left hip fracture again demonstrated, status post operative fixation. Degenerative changes of the spine. No other acute osseous finding.  IMPRESSION: Decompressed stomach, small bowel and colon following NG tube insertion and decompression colonoscopy.  Colon appears elongated and redundant with areas containing residual fluid with air-fluid levels but no significant recurrent ileus at this time.  Negative for abscess or free air  Nonspecific mesenteric stranding edema and a small amount of free fluid in the right lower quadrant without clear etiology possibly related to the recent colonic ileus.  Appendix not visualized.  Postop changes in the pelvis and left hip region   Electronically Signed   By: Jerilynn Mages.  Shick M.D.   On: 10/24/2014 20:59   Dg Abd 1 View  10/25/2014   CLINICAL DATA:  Ileus, lower abdominal pain.  EXAM: ABDOMEN - 1 VIEW  COMPARISON:  10/24/2014  FINDINGS: NG tube remains in stable position in the stomach. Significant decreasing gaseous distention of the colon. Mild gaseous distention of the cecum and proximal transverse colon. No small bowel dilatation. No free air organomegaly. Prior cholecystectomy. Surgical clips along the pelvic sidewalls, presumably prior lymph node dissection.  IMPRESSION: Significant improvement in gaseous distention of the colon.   Electronically Signed   By: Rolm Baptise M.D.   On: 10/25/2014 10:41   Dg Abd 1 View  10/24/2014   CLINICAL DATA:  Distention  EXAM: ABDOMEN - 1 VIEW  COMPARISON:  10/22/2014  FINDINGS: Gaseous distention of the ascending and transverse colon.  Left colon is relatively decompressed.  No evidence of small bowel obstruction.  Enteric tube in the distal stomach.  Cholecystectomy clips.  Surgical clips in the pelvis.  Status post ORIF of the left hip.  IMPRESSION: Gaseous distention of the right colon, suggesting adynamic colonic ileus.  No evidence of small bowel obstruction   Electronically Signed   By:  Julian Hy M.D.   On: 10/24/2014 11:28    Scheduled Meds: . allopurinol  300 mg Oral q morning - 10a  . erythromycin  250 mg Intravenous 3 times per day  . fluconazole (DIFLUCAN) IV  100 mg Intravenous Q24H  . insulin glargine  40 Units Subcutaneous QHS  . metoCLOPramide (REGLAN) injection  10 mg Intravenous 4 times per day  . metoprolol  5 mg Intravenous 4 times  per day  . polyethylene glycol  17 g Oral QID  . potassium chloride  40 mEq Oral BID  . rosuvastatin  10 mg Oral QPM   Continuous Infusions: . sodium chloride    . diltiazem (CARDIZEM) infusion    . heparin 1,250 Units/hr (10/25/14 2056)  . sodium chloride 0.9 % 1,000 mL with potassium chloride 40 mEq infusion 100 mL/hr at 10/26/14 0504    Time Spent: 25 min   Charlynne Cousins  [facility] Pager [phone]. If 7PM-7AM, please contact night-coverage at [email or web address], password [facility] 10/26/2014, 8:19 AM  LOS: 7 days

## 2014-10-26 NOTE — Progress Notes (Signed)
10/26/2014 1:44 PM Nursing note NGT clamped per orders. Pt. Educated to notify RN immediately of any resumption of Nausea. Pt. Verbalized understanding. Will continue to closely monitor patient.  Rosha Cocker, Arville Lime

## 2014-10-26 NOTE — Progress Notes (Signed)
GASTROENTEROLOGY PROGRESS NOTE  Problem:   Intestinal ileus following surgery. Mild colonic mucosal ischemia.  Subjective: No abdominal pain, but the patient feels somewhat bloated. He has had several small bowel movements while urinating but does not seem to be passing flatus. NG tube is clamped  Objective: No distress, resting comfortably in bed. Abdomen still quite protuberant, moderately tympanitic, soft, nontender, bowel sounds present. Potassium 4.7 KUB from today shows, on my review as well as official interpretation, some increase in his colonic gas, but nothing extreme. Moreover, the gas is not primarily in the distal colon where a rectal tube or repeat decompressive sigmoidoscopy would be likely to be helpful. White blood count essentially stable over the past couple of days.  Assessment: Stable to slightly improved colonic ileus  Plan: 1. Continue to monitor potassium to keep it above 4.0 2. Monitor clinically, I don't think we need to repeat a KUB tomorrow unless patient worsens 3. Probably okay to start clear liquid diet tomorrow and possibly DC NG tube entirely (currently clamped)  Howard Charles, M.D. 10/26/2014 2:58 PM  Pager [phone] If no answer or after 5 PM call [phone]

## 2014-10-26 NOTE — Progress Notes (Addendum)
     Subjective:  Patient reports pain as mild.  Resting comfortably in bed.  Nursing reports drainage from incision yesterday.  Dressing was changed.  Very minimal drainage seen today.   Objective:   VITALS:   Filed Vitals:   10/25/14 1345 10/25/14 2006 10/26/14 0003 10/26/14 0413  BP: 125/77 149/72 141/75 145/92  Pulse: 90 86 81 79  Temp: 98.2 F (36.8 C) 98.5 F (36.9 C)  98.1 F (36.7 C)  TempSrc: Oral Oral  Oral  Resp: 18 18  18   Height:      Weight:      SpO2: 94% 95%  94%    Neurologically intact ABD soft Neurovascular intact Sensation intact distally Intact pulses distally Dorsiflexion/Plantar flexion intact Incision: scant drainage No erythema or warmth to the area, no active signs of infection  Lab Results  Component Value Date   WBC 19.1* 10/26/2014   HGB 11.3* 10/26/2014   HCT 33.7* 10/26/2014   MCV 92.1 10/26/2014   PLT 282 10/26/2014   BMET    Component Value Date/Time   NA 139 10/26/2014 0305   K 4.7 10/26/2014 0305   CL 111 10/26/2014 0305   CO2 20* 10/26/2014 0305   GLUCOSE 86 10/26/2014 0305   BUN 30* 10/26/2014 0305   CREATININE 1.32* 10/26/2014 0305   CALCIUM 8.7* 10/26/2014 0305   GFRNONAA 50* 10/26/2014 0305   GFRAA 58* 10/26/2014 0305     Assessment/Plan: 2 Days Post-Op   Principal Problem:   Closed left hip fracture Active Problems:   Diabetes mellitus type 2 in obese   Hypertension   Chronic renal insufficiency, stage III (moderate)   A-fib   Prostate cancer   Atrial fibrillation with RVR   Ileus, postoperative   Up with therapy WBAT in the LLE Dry dressing change PRN, will monitor incision for signs of infections appears to be serous drainage at this time Continue plan per medicine/gastro/gen surgery  Kelly,Brittney Marie 10/26/2014, 9:09 AM Cell [phone]   ADDENDUM:  PATIENT DOES NOT HAVE ALZHEIMER'S.  INCORRECTLY PULLED FORWARD FROM HISTORICAL INACCURATE NOTE.  THIS NOTE HAS BEEN CORRECTED.

## 2014-10-26 NOTE — Progress Notes (Signed)
2 Days Post-Op  Subjective: Moving bowels NGT in place bloody appearing   Objective: Vital signs in last 24 hours: Temp:  [98.1 F (36.7 C)-98.5 F (36.9 C)] 98.1 F (36.7 C) (05/28 0413) Pulse Rate:  [79-90] 79 (05/28 0413) Resp:  [18] 18 (05/28 0413) BP: (125-149)/(72-92) 145/92 mmHg (05/28 0413) SpO2:  [94 %-95 %] 94 % (05/28 0413) Last BM Date: 10/26/14  Intake/Output from previous day: 05/27 0701 - 05/28 0700 In: -  Out: 1496 [Urine:1225; Emesis/NG output:270; Stool:1] Intake/Output this shift: Total I/O In: 0  Out: 200 [Urine:200]  GI: distended but sot not tender umbilical hernia noted  Lab Results:   Recent Labs  10/25/14 0439 10/26/14 0305  WBC 18.5* 19.1*  HGB 11.6* 11.3*  HCT 34.2* 33.7*  PLT 246 282   BMET  Recent Labs  10/25/14 0439 10/26/14 0305  NA 136 139  K 4.3 4.7  CL 109 111  CO2 22 20*  GLUCOSE 116* 86  BUN 35* 30*  CREATININE 1.42* 1.32*  CALCIUM 8.4* 8.7*   PT/INR No results for input(s): LABPROT, INR in the last 72 hours. ABG No results for input(s): PHART, HCO3 in the last 72 hours.  Invalid input(s): PCO2, PO2  Studies/Results: Ct Abdomen Pelvis Wo Contrast  10/24/2014   CLINICAL DATA:  Diffuse abdominal pain for 3 days, recent ileus, status post decompression colonoscopy earlier today  EXAM: CT ABDOMEN AND PELVIS WITHOUT CONTRAST  TECHNIQUE: Multidetector CT imaging of the abdomen and pelvis was performed following the standard protocol without IV contrast.  COMPARISON:  10/24/2014 plain radiographs, 02/01/2006 CT abdomen with contrast  FINDINGS: Lower chest: Right base partial collapse/consolidation evident. Minor left base atelectasis. Mild cardiomegaly. Small hiatal hernia evident. No pericardial or pleural effusion.  Abdomen: Limited exam without IV or oral contrast. NG tube within the distal stomach body. Stomach, small bowel and colon are decompressed following the decompression colonoscopy and NG tube insertion. No  current significant dilatation, obstruction or ileus. Colon has some areas of scattered retained fluid with small air-fluid levels. No free air.  Prior cholecystectomy noted. Liver, biliary system, fatty replaced pancreas, spleen, and adrenal glands are within normal limits for age and noncontrast imaging. Kidneys demonstrate no renal obstruction or hydronephrosis. Left lower pole renal cyst suspected measuring 3.3 cm, image 53.  No abdominal hemorrhage, fluid collection, abscess, or adenopathy.  Aortic atherosclerosis noted without aneurysm or retroperitoneal hemorrhage.  Small amount of right lower quadrant stranding mesenteric edema and free fluid, images 71 through 87 without clear etiology. Appendix is not visualized.  Small fat containing umbilical hernia.  Pelvis: Postop changes in the pelvis prior prostatectomy and lymph node dissection. Urinary bladder unremarkable. No pelvic fluid collection, hemorrhage, abscess, or adenopathy. No inguinal abnormality or significant hernia. Left hip fixation hardware creates artifact. Postop changes of the left hip region.  Left hip fracture again demonstrated, status post operative fixation. Degenerative changes of the spine. No other acute osseous finding.  IMPRESSION: Decompressed stomach, small bowel and colon following NG tube insertion and decompression colonoscopy.  Colon appears elongated and redundant with areas containing residual fluid with air-fluid levels but no significant recurrent ileus at this time.  Negative for abscess or free air  Nonspecific mesenteric stranding edema and a small amount of free fluid in the right lower quadrant without clear etiology possibly related to the recent colonic ileus.  Appendix not visualized.  Postop changes in the pelvis and left hip region   Electronically Signed   By: Jerilynn Mages.  Shick M.D.   On: 10/24/2014 20:59   Dg Abd 1 View  10/25/2014   CLINICAL DATA:  Ileus, lower abdominal pain.  EXAM: ABDOMEN - 1 VIEW  COMPARISON:   10/24/2014  FINDINGS: NG tube remains in stable position in the stomach. Significant decreasing gaseous distention of the colon. Mild gaseous distention of the cecum and proximal transverse colon. No small bowel dilatation. No free air organomegaly. Prior cholecystectomy. Surgical clips along the pelvic sidewalls, presumably prior lymph node dissection.  IMPRESSION: Significant improvement in gaseous distention of the colon.   Electronically Signed   By: Rolm Baptise M.D.   On: 10/25/2014 10:41   Dg Abd 1 View  10/24/2014   CLINICAL DATA:  Distention  EXAM: ABDOMEN - 1 VIEW  COMPARISON:  10/22/2014  FINDINGS: Gaseous distention of the ascending and transverse colon.  Left colon is relatively decompressed.  No evidence of small bowel obstruction.  Enteric tube in the distal stomach.  Cholecystectomy clips.  Surgical clips in the pelvis.  Status post ORIF of the left hip.  IMPRESSION: Gaseous distention of the right colon, suggesting adynamic colonic ileus.  No evidence of small bowel obstruction   Electronically Signed   By: Julian Hy M.D.   On: 10/24/2014 11:28    Anti-infectives: Anti-infectives    Start     Dose/Rate Route Frequency Ordered Stop   10/24/14 1400  erythromycin 250 mg in sodium chloride 0.9 % 100 mL IVPB     250 mg 100 mL/hr over 60 Minutes Intravenous 3 times per day 10/24/14 1239     10/23/14 1100  fluconazole (DIFLUCAN) IVPB 100 mg     100 mg 50 mL/hr over 60 Minutes Intravenous Every 24 hours 10/23/14 1043     10/20/14 1230  ceFAZolin (ANCEF) IVPB 2 g/50 mL premix     2 g 100 mL/hr over 30 Minutes Intravenous Every 6 hours 10/20/14 1114 10/20/14 1912      Assessment/Plan: s/p Procedure(s): BOWEL DECOMPRESSION (N/A) COLONOSCOPY (N/A) Soft abdomen  Still distended Check film Non surgical at this point GI following  Neostigmine if pt a candidate if he does not improve.   LOS: 7 days    Damante Spragg A. 10/26/2014

## 2014-10-26 NOTE — Progress Notes (Addendum)
10/26/2014 5:01 PM Nursing note Noted pt. With more frequent episodes of Afib RVR this afternoon, non-sustained, pt. Symptomatic resting in bed. Pt currently Afib rate 133.  Noted cardizem gtt still ordered on [facility] however per prior nursing notes had been held due to prior maintenance of NSR. Dr. Olevia Bowens paged and made aware. Telephone order received ok to resume Cardizem gtt at 5 mg/hr. Orders enacted. Pt. And family updated on plan of care. Will continue to closely monitor patient.  Jenika Chiem, Arville Lime

## 2014-10-26 NOTE — Progress Notes (Signed)
ANTICOAGULATION CONSULT NOTE - Follow Up Consult  Pharmacy Consult for heparin Indication: atrial fibrillation  No Known Allergies  Patient Measurements: Wt= 101.6kg Ht= 5' 10'' IBW= 73kg Heparin dosing wt: 94kg  Vital Signs: Temp: 98.1 F (36.7 C) (05/28 0413) Temp Source: Oral (05/28 0413) BP: 145/92 mmHg (05/28 0413) Pulse Rate: 79 (05/28 0413)  Labs:  Recent Labs  10/24/14 0448 10/24/14 0915 10/24/14 1902  10/25/14 0439 10/25/14 0808 10/25/14 1942 10/26/14 0305  HGB 12.9*  --  11.9*  --  11.6*  --   --  11.3*  HCT 37.9*  --  34.8*  --  34.2*  --   --  33.7*  PLT 269  --  237  --  246  --   --  282  APTT  --  54*  --   < >  --  46* 121* 81*  HEPARINUNFRC  --  1.52*  --   --   --  0.62  --  0.57  CREATININE 1.64*  --   --   --  1.42*  --   --  1.32*  < > = values in this interval not displayed.  Estimated Creatinine Clearance: 55.9 mL/min (by C-G formula based on Cr of 1.32).   Medical History: Past Medical History  Diagnosis Date  . Diabetes mellitus type 2 in obese   . Diabetic neuropathy   . Hypertension   . GERD (gastroesophageal reflux disease)   . Gout   . Left knee DJD   . Right knee DJD   . Bilateral hearing loss     WEARS HEARING AIDS........RIGHT IS BETTER THEN LEFT  . Chronic renal insufficiency, stage III (moderate)     Creatinine 1.47  . Dysrhythmia     h/o A Fib  . On supplemental oxygen therapy     at night  . Ileus, postoperative 10/22/2014  . PONV (postoperative nausea and vomiting)     H/O ....BUT LAST FEW TIMES---NONE    Medications:  Scheduled:  . allopurinol  300 mg Oral q morning - 10a  . erythromycin  250 mg Intravenous 3 times per day  . fluconazole (DIFLUCAN) IV  100 mg Intravenous Q24H  . insulin glargine  40 Units Subcutaneous QHS  . metoCLOPramide (REGLAN) injection  10 mg Intravenous 4 times per day  . metoprolol  5 mg Intravenous 4 times per day  . polyethylene glycol  17 g Oral QID  . potassium chloride  40 mEq  Oral BID  . rosuvastatin  10 mg Oral QPM   Infusions:  . sodium chloride    . dextrose 5 % 1,000 mL with sodium bicarbonate 100 mEq infusion 75 mL/hr at 10/26/14 1036  . diltiazem (CARDIZEM) infusion    . heparin 1,250 Units/hr (10/26/14 1046)  . sodium chloride 0.9 % 1,000 mL with potassium chloride 40 mEq infusion 100 mL/hr at 10/26/14 7564    Assessment: 79 yo male with afib on xarelto. He is s/p fall with hip fracture and repair on 5/22 and and in now noted with post-op ileus > NPO. There is concern for absorption of po Xarelto and this was discussed with Dr. Olevia Bowens changed to IV heparin.  Initial heparin level is 1.52 (xarelto influence) and aptt is below goal at 56.  Last dose of xarelto was5/24 at about 4pm.  Heparin drip 1250 uts/hr aPTT = 81 sec at goal  HL and aPTT correlating - stop aPTT No bleeding, CBC stable Pt feeling better - when  taking pos - f/u restart Xarelto   Goal of Therapy:  Heparin level 0.3-0.7 units/ml aPTT 66-102 seconds Monitor platelets by anticoagulation protocol: Yes   Plan:  Continue  heparin to 1250 units/hr -daily heparin level and CBC   Bonnita Nasuti Pharm.D. CPP, BCPS Clinical Pharmacist [phone] 10/26/2014 2:04 PM

## 2014-10-26 NOTE — Progress Notes (Signed)
10/26/2014 1:31 PM Nursing note Noted CDiff PCR negative. Precautions d/c per protocol. Pt. Updated on plan of care.  Karlon Schlafer, Arville Lime

## 2014-10-26 NOTE — Progress Notes (Signed)
10/26/2014 1750 Noted pt. Back in NSR rate 70. Notified Dr. Olevia Bowens of rhythm change. Telephone verbal order received to still initiate Cardizem gtt. Orders enacted. Pt. Updated on plan of care. Will continue to closely monitor patient. Kindsey Eblin, Arville Lime

## 2014-10-27 ENCOUNTER — Inpatient Hospital Stay (HOSPITAL_COMMUNITY): Payer: Medicare Other

## 2014-10-27 LAB — GLUCOSE, CAPILLARY
GLUCOSE-CAPILLARY: 126 mg/dL — AB (ref 65–99)
GLUCOSE-CAPILLARY: 180 mg/dL — AB (ref 65–99)
Glucose-Capillary: 110 mg/dL — ABNORMAL HIGH (ref 65–99)
Glucose-Capillary: 116 mg/dL — ABNORMAL HIGH (ref 65–99)
Glucose-Capillary: 119 mg/dL — ABNORMAL HIGH (ref 65–99)
Glucose-Capillary: 147 mg/dL — ABNORMAL HIGH (ref 65–99)

## 2014-10-27 LAB — CBC
HCT: 32.6 % — ABNORMAL LOW (ref 39.0–52.0)
Hemoglobin: 10.9 g/dL — ABNORMAL LOW (ref 13.0–17.0)
MCH: 31 pg (ref 26.0–34.0)
MCHC: 33.4 g/dL (ref 30.0–36.0)
MCV: 92.6 fL (ref 78.0–100.0)
Platelets: 280 10*3/uL (ref 150–400)
RBC: 3.52 MIL/uL — ABNORMAL LOW (ref 4.22–5.81)
RDW: 14.8 % (ref 11.5–15.5)
WBC: 14.7 10*3/uL — ABNORMAL HIGH (ref 4.0–10.5)

## 2014-10-27 LAB — HEPARIN LEVEL (UNFRACTIONATED): Heparin Unfractionated: 0.45 IU/mL (ref 0.30–0.70)

## 2014-10-27 LAB — BASIC METABOLIC PANEL
Anion gap: 5 (ref 5–15)
BUN: 26 mg/dL — ABNORMAL HIGH (ref 6–20)
CALCIUM: 8.7 mg/dL — AB (ref 8.9–10.3)
CO2: 23 mmol/L (ref 22–32)
Chloride: 106 mmol/L (ref 101–111)
Creatinine, Ser: 1.22 mg/dL (ref 0.61–1.24)
GFR calc Af Amer: >60 mL/min (ref >60)
GFR, EST NON AFRICAN AMERICAN: 55 mL/min — AB (ref >60)
Glucose, Bld: 140 mg/dL — ABNORMAL HIGH (ref 65–99)
Potassium: 4.2 mmol/L (ref 3.5–5.1)
Sodium: 134 mmol/L — ABNORMAL LOW (ref 135–145)

## 2014-10-27 NOTE — Progress Notes (Signed)
Physical Therapy Treatment Patient Details Name: Howard Charles MRN: [id number] DOB: 1935-12-05 Today's Date: 10/27/2014    History of Present Illness Pt is a 79 y/o M s/p fall and L hip fx. Pt developed ileus post op and had NG placed.  Pt's PMH includes DM, diabetic neuropathy, gout, HTN, B TKA, B hearing loss, chronic renal insufficiency, on supplemental O2 at night, a fib, rotator cuff arthroscopy.    PT Comments    Pt making slow, steady progress. Continues to need ST-SNF prior to return home.  Follow Up Recommendations  SNF     Equipment Recommendations  Rolling walker with 5" wheels    Recommendations for Other Services       Precautions / Restrictions Precautions Precautions: Fall Restrictions Weight Bearing Restrictions: Yes LLE Weight Bearing: Weight bearing as tolerated    Mobility  Bed Mobility Overal bed mobility: Needs Assistance Bed Mobility: Supine to Sit     Supine to sit: Mod assist     General bed mobility comments: Assist of move LLE and to elevate trunk into sitting.  Transfers Overall transfer level: Needs assistance Equipment used: Rolling walker (2 wheeled) Transfers: Sit to/from Stand Sit to Stand: +2 physical assistance;Mod assist         General transfer comment: Assist to power-up to full standing position. Increased time to gain and maintain balance prior to initiating pivotal steps around to the [facility], and then recliner.   Ambulation/Gait Ambulation/Gait assistance: Min assist Ambulation Distance (Feet): 40 Feet Assistive device: Rolling walker (2 wheeled) Gait Pattern/deviations: Step-through pattern;Decreased step length - right;Decreased step length - left;Decreased stance time - left Gait velocity: Decreased Gait velocity interpretation: Below normal speed for age/gender General Gait Details: Verbal cues to stand more erect.   Stairs            Wheelchair Mobility    Modified Rankin (Stroke Patients Only)       Balance Overall balance assessment: Needs assistance Sitting-balance support: Feet supported;No upper extremity supported Sitting balance-Leahy Scale: Fair     Standing balance support: Bilateral upper extremity supported Standing balance-Leahy Scale: Poor Standing balance comment: Walker and min A for static standing.                    Cognition Arousal/Alertness: Awake/alert Behavior During Therapy: WFL for tasks assessed/performed Overall Cognitive Status: History of cognitive impairments - at baseline                      Exercises      General Comments        Pertinent Vitals/Pain Pain Assessment: Faces Faces Pain Scale: Hurts even more Pain Location: lt hip Pain Descriptors / Indicators: Grimacing Pain Intervention(s): Limited activity within patient's tolerance;Repositioned    Home Living                      Prior Function            PT Goals (current goals can now be found in the care plan section) Acute Rehab PT Goals Patient Stated Goal: to get rid of some of this pain. PT Goal Formulation: With patient Time For Goal Achievement: 10/28/14 Potential to Achieve Goals: Good Progress towards PT goals: Progressing toward goals    Frequency  Min 3X/week    PT Plan Current plan remains appropriate    Co-evaluation             End of Session Equipment Utilized  During Treatment: Gait belt Activity Tolerance: Patient tolerated treatment well Patient left: in chair;with call bell/phone within reach;with family/visitor present     Time: 1610-9604 PT Time Calculation (min) (ACUTE ONLY): 19 min  Charges:  $Gait Training: 8-22 mins                    G Codes:      Howard Charles 10-31-2014, 12:19 PM  [employer] PT [phone]

## 2014-10-27 NOTE — Progress Notes (Signed)
ANTICOAGULATION CONSULT NOTE - Follow Up Consult  Pharmacy Consult for heparin Indication: atrial fibrillation  No Known Allergies  Patient Measurements: Wt= 101.6kg Ht= 5' 10'' IBW= 73kg Heparin dosing wt: 94kg  Vital Signs: Temp: 98.2 F (36.8 C) (05/29 0411) Temp Source: Oral (05/29 0411) BP: 129/60 mmHg (05/29 0411) Pulse Rate: 62 (05/29 0411)  Labs:  Recent Labs  10/25/14 0439 10/25/14 0808 10/25/14 1942 10/26/14 0305 10/27/14 0509  HGB 11.6*  --   --  11.3* 10.9*  HCT 34.2*  --   --  33.7* 32.6*  PLT 246  --   --  282 280  APTT  --  46* 121* 81*  --   HEPARINUNFRC  --  0.62  --  0.57 0.45  CREATININE 1.42*  --   --  1.32* 1.22    Estimated Creatinine Clearance: 60.5 mL/min (by C-G formula based on Cr of 1.22).   Medical History: Past Medical History  Diagnosis Date  . Diabetes mellitus type 2 in obese   . Diabetic neuropathy   . Hypertension   . GERD (gastroesophageal reflux disease)   . Gout   . Left knee DJD   . Right knee DJD   . Bilateral hearing loss     WEARS HEARING AIDS........RIGHT IS BETTER THEN LEFT  . Chronic renal insufficiency, stage III (moderate)     Creatinine 1.47  . Dysrhythmia     h/o A Fib  . On supplemental oxygen therapy     at night  . Ileus, postoperative 10/22/2014  . PONV (postoperative nausea and vomiting)     H/O ....BUT LAST FEW TIMES---NONE    Medications:  Scheduled:  . allopurinol  300 mg Oral q morning - 10a  . erythromycin  250 mg Intravenous 3 times per day  . fluconazole (DIFLUCAN) IV  100 mg Intravenous Q24H  . insulin glargine  40 Units Subcutaneous QHS  . metoCLOPramide (REGLAN) injection  10 mg Intravenous 4 times per day  . metoprolol  5 mg Intravenous 4 times per day  . polyethylene glycol  17 g Oral QID  . potassium chloride  40 mEq Oral BID  . rosuvastatin  10 mg Oral QPM   Infusions:  . sodium chloride    . dextrose 5 % 1,000 mL with sodium bicarbonate 100 mEq infusion 75 mL/hr at 10/27/14  0323  . diltiazem (CARDIZEM) infusion Stopped (10/27/14 3810)  . heparin 1,250 Units/hr (10/27/14 0323)  . sodium chloride 0.9 % 1,000 mL with potassium chloride 40 mEq infusion 100 mL/hr at 10/26/14 1751    Assessment: 79 yo male with afib on xarelto. He is s/p fall with hip fracture and repair on 5/22 and and in now noted with post-op ileus > NPO. There is concern for absorption of po Xarelto and this was discussed with Dr. Olevia Bowens changed to IV heparin.  Initial heparin level is 1.52 (xarelto influence) and aptt is below goal at 56.  Last dose of xarelto was5/24 at about 4pm.  Heparin drip 1250 uts/hr HL 0.45 at goal  HL and aPTT correlating - stop aPTT 5/28 No bleeding, CBC stable Pt feeling better - when taking pos - f/u restart Xarelto   Goal of Therapy:  HL 0.3-0.7 Monitor platelets by anticoagulation protocol: Yes   Plan:  Continue  heparin to 1250 units/hr -daily heparin level and CBC   Bonnita Nasuti Pharm.D. CPP, BCPS Clinical Pharmacist [phone] 10/27/2014 11:02 AM

## 2014-10-27 NOTE — Progress Notes (Signed)
No abdominal pain, perhaps some bloating. No bowel movements since yesterday. NG tube is out, no nausea, is to start clear liquids later today. Has been walking with physical therapy today.  Labs: White count substantially improved at 14,700, KUB basically stable with moderate gas retention but no severe distention in the transverse section of the colon. Potassium remaining above 4.  Exam: Sitting up in chair, appears comfortable. NG output. Abdomen quite distended, moderately tympanitic, no bowel sounds, no tenderness.  Impression: Moderately improved ileus. Some residual colonic dysmotility/atony/gas retention.  Recommendation: Continue current management; agree with plan for liquids, possibly solid food tomorrow if that is well tolerated. We will continue to monitor potassium and KUB.  Howard Charles, M.D. Pager [phone] If no answer or after 5 PM call [phone]

## 2014-10-27 NOTE — Progress Notes (Signed)
3 Days Post-Op  Subjective: NG clamped,  Since 1:44 PM yesterday, some stools, not a lot of flatus but some stool in bedpan, bedside commode, and in the bed.  He has only walked once, in part because of pain in his leg, he has been up to the chair some.  He feels better, I resumed suction and irrigated NG just to see how much he drained, and so far almost nothing so far.  Yesterday he was still pretty dialated.  Objective: Vital signs in last 24 hours: Temp:  [98.2 F (36.8 C)-98.9 F (37.2 C)] 98.2 F (36.8 C) (05/29 0411) Pulse Rate:  [62-88] 62 (05/29 0411) Resp:  [18-20] 20 (05/29 0411) BP: (129-148)/(60-80) 129/60 mmHg (05/29 0411) SpO2:  [92 %-95 %] 92 % (05/29 0411) Last BM Date: 10/26/14 NPO Stools x 2 recorded Nothing from NG recorded. Afebrile, VSS K+ 4.2 WBC improving C diff negative Intake/Output from previous day: 05/28 0701 - 05/29 0700 In: 0  Out: 1325 [Urine:1325] Intake/Output this shift:    General appearance: alert, cooperative, no distress and much more comfortable GI: soft, very few BS, but having some stool. no tenderness, I resumed suction just to see what we got, not much so far.  Lab Results:   Recent Labs  10/26/14 0305 10/27/14 0509  WBC 19.1* 14.7*  HGB 11.3* 10.9*  HCT 33.7* 32.6*  PLT 282 280    BMET  Recent Labs  10/26/14 0305 10/27/14 0509  NA 139 134*  K 4.7 4.2  CL 111 106  CO2 20* 23  GLUCOSE 86 140*  BUN 30* 26*  CREATININE 1.32* 1.22  CALCIUM 8.7* 8.7*   PT/INR No results for input(s): LABPROT, INR in the last 72 hours.  No results for input(s): AST, ALT, ALKPHOS, BILITOT, PROT, ALBUMIN in the last 168 hours.   Lipase  No results found for: LIPASE   Studies/Results: Dg Abd 1 View  10/26/2014   CLINICAL DATA:  Ileus  EXAM: ABDOMEN - 1 VIEW  COMPARISON:  10/25/2014  FINDINGS: Increased extent of gaseous prominence of colon, now 7.2 cm maximal diameter but relatively featureless. No gas-filled dilated small bowel  loop. Pelvic clips are noted. Left femoral nail in place. No acute osseous finding. Nasogastric tube tip terminates over the distal stomach, which is decompressed.  IMPRESSION: Relatively featureless borderline dilated gas-filled colon, with slight interval increase in diameter. Although this may be seen with ileus, this raises the question of underlying long-standing inflammatory bowel disease.   Electronically Signed   By: Conchita Paris M.D.   On: 10/26/2014 11:33   Dg Abd 1 View  10/25/2014   CLINICAL DATA:  Ileus, lower abdominal pain.  EXAM: ABDOMEN - 1 VIEW  COMPARISON:  10/24/2014  FINDINGS: NG tube remains in stable position in the stomach. Significant decreasing gaseous distention of the colon. Mild gaseous distention of the cecum and proximal transverse colon. No small bowel dilatation. No free air organomegaly. Prior cholecystectomy. Surgical clips along the pelvic sidewalls, presumably prior lymph node dissection.  IMPRESSION: Significant improvement in gaseous distention of the colon.   Electronically Signed   By: Rolm Baptise M.D.   On: 10/25/2014 10:41    Medications: . allopurinol  300 mg Oral q morning - 10a  . erythromycin  250 mg Intravenous 3 times per day  . fluconazole (DIFLUCAN) IV  100 mg Intravenous Q24H  . insulin glargine  40 Units Subcutaneous QHS  . metoCLOPramide (REGLAN) injection  10 mg Intravenous 4 times per  day  . metoprolol  5 mg Intravenous 4 times per day  . polyethylene glycol  17 g Oral QID  . potassium chloride  40 mEq Oral BID  . rosuvastatin  10 mg Oral QPM   . sodium chloride    . dextrose 5 % 1,000 mL with sodium bicarbonate 100 mEq infusion 75 mL/hr at 10/27/14 0323  . diltiazem (CARDIZEM) infusion Stopped (10/27/14 3893)  . heparin 1,250 Units/hr (10/27/14 0323)  . sodium chloride 0.9 % 1,000 mL with potassium chloride 40 mEq infusion 100 mL/hr at 10/26/14 7342    Assessment/Plan Abdominal distension secondary to colonic ileus Colonoscopy,  diagnostic with decompression 10/24/14, Dr. Watt Climes Closed hip fx s/p left INTRAMEDULLARY (IM) NAIL INTERTROCHANTRIC, 10/20/14, Dr. [name] PAF with RVR now off Cardizem drip Hypertension AODM Stage III renal disease Alzheimer's disease Body mass index is 32.  Antibiotics: Erythromycin day 4, diflucan day 5  (not sure why he is on diflucan) DVT: SCD/Heparin drip   Plan:  I got another film for today.  I think we could take out his NG, he did fine with it clamped all day yesterday, but rechecking film before pulling tube and starting clears. If we could get him up and moving that would help. I recognize with his surgery last week that is easier said than done.    LOS: 8 days    Fiorela Pelzer 10/27/2014

## 2014-10-27 NOTE — Progress Notes (Signed)
[facility] PROGRESS NOTE Interim History: 79 y.o. male  has a past medical history of Diabetes mellitus type 2 in obese; Diabetic neuropathy; Hypertension; GERD  Gout; Left knee DJD; Right knee DJD; PONV  Bilateral hearing loss; Chronic renal insufficiency, stage III (moderate); Dysrhythmia; and On supplemental oxygen therapy. Presented with fall while at the grocery store, Patient lost balance and fell hitting his HEAD and left hip he was not able to get up. EMS was called and he arrived to ER. Plain imaging showed Intertochanteric fracture of the left hip. Patient with hx of A.fib on Xarelto last dose this AM and coreg. On arrival to ER he was tachycardic with HR up to 130 with A.fib he was given a dose of diltiazem and improved with HR down to 80's currently converted to sinus rhythm with heart rate of 70. Develop Ileus, NG tube place, GI consult Colonoscopy done showed small areas of ischemia. CT abd and pelvis showed improved iLeus, lactic acid 1.0.  Assessment/Plan: Ileus, postoperative: - His head multiple bowel movements last 24 hours. - NG tube has been clamped for 24 hours and has tolerated ice chips. - Cont Reglan, change IV fluid to D5 with HCO. - Keep mag. > 2.0 and K >4.0. - We'll start a clear liquid diet  Atrial fibrillation with RVR: - Cardiology was consulted he was started on IV diltiazem, now in sinus rhythm. Cont IV diltiazem. - As the patient is currently nothing by mouth we'll switch him to IV heparin. - hbg remaines stable  Left closed hip fracture: - Orthopedic surgery was consulted and into her trochanteric IM nailing performed on 10/18/2014. - Physical therapy was consulted which recommended skilled nursing facility.  Alzheimer disease: Very subtle.  Acute on Chronic kidney see stage III: - Most likely prerenal due to decreased oral intake. - Resolved with hydration.  Prostate cancer  Diabetes mellitus type 2 in obese: - cont Lantus plus  SSI. - BG well controlled.  Essential  Hypertension - Bp stable.  Code Status: Full Code Family Communication: d/w patient Disposition Plan: remain inpatient   Consultants:  GI  Procedures:  abd x-ray  Colonoscopy 5.26.2016: Multiple short discrete areas of ischemia in the sigmoid descending and transverse with normal bowel in between an air and fluid suctioned as above and no other obvious findings in unprepped exam  CT abd and pelvis 5.26.2016: Decompressed stomach, small bowel and colon following NG tube  insertion and decompression colonoscopy.  Antibiotics:  None  HPI/Subjective: Patient relates he is not nauseated and  abdominal resolved is not passing gas but his had one bowel movement.  Objective: Filed Vitals:   10/26/14 1525 10/26/14 1800 10/26/14 1937 10/27/14 0411  BP: 148/80 135/62 132/71 129/60  Pulse: 79 88 75 62  Temp: 98.9 F (37.2 C)  98.8 F (37.1 C) 98.2 F (36.8 C)  TempSrc: Oral  Oral Oral  Resp: 20  18 20   Height:      Weight:      SpO2: 94%  95% 92%    Intake/Output Summary (Last 24 hours) at 10/27/14 1001 Last data filed at 10/27/14 0842  Gross per 24 hour  Intake      0 ml  Output   1125 ml  Net  -1125 ml   Filed Weights   10/24/14 1442  Weight: 101.606 kg (224 lb)    Exam:  General: Alert, awake, oriented x3, in no acute distress.  HEENT: No bruits, no goiter.  Heart: Regular rate  and rhythm. Lungs: Good air movement, bilateral air movement.  Abdomen: Soft, no tenderness, lessdistended, no bowel sounds.  Neuro: Grossly intact, nonfocal.   Data Reviewed: Basic Metabolic Panel:  Recent Labs Lab 10/23/14 0529 10/24/14 0448 10/25/14 0439 10/26/14 0305 10/26/14 1025 10/27/14 0509  NA 135 137 136 139  --  134*  K 3.7 3.6 4.3 4.7  --  4.2  CL 105 106 109 111  --  106  CO2 21* 23 22 20*  --  23  GLUCOSE 109* 136* 116* 86  --  140*  BUN 36* 38* 35* 30*  --  26*  CREATININE 1.69* 1.64* 1.42* 1.32*  --  1.22   CALCIUM 8.7* 8.7* 8.4* 8.7*  --  8.7*  MG  --  1.9  --   --  2.0  --   PHOS  --  2.2*  --   --   --   --    Liver Function Tests: No results for input(s): AST, ALT, ALKPHOS, BILITOT, PROT, ALBUMIN in the last 168 hours. No results for input(s): LIPASE, AMYLASE in the last 168 hours. No results for input(s): AMMONIA in the last 168 hours. CBC:  Recent Labs Lab 10/24/14 0448 10/24/14 1902 10/25/14 0439 10/26/14 0305 10/27/14 0509  WBC 24.1* 19.1* 18.5* 19.1* 14.7*  NEUTROABS  --  16.2*  --   --   --   HGB 12.9* 11.9* 11.6* 11.3* 10.9*  HCT 37.9* 34.8* 34.2* 33.7* 32.6*  MCV 92.2 92.3 92.2 92.1 92.6  PLT 269 237 246 282 280   Cardiac Enzymes: No results for input(s): CKTOTAL, CKMB, CKMBINDEX, TROPONINI in the last 168 hours. BNP (last 3 results) No results for input(s): BNP in the last 8760 hours.  ProBNP (last 3 results) No results for input(s): PROBNP in the last 8760 hours.  CBG:  Recent Labs Lab 10/26/14 1109 10/26/14 1624 10/26/14 2006 10/27/14 0001 10/27/14 0410  GLUCAP 105* 101* 127* 116* 126*    Recent Results (from the past 240 hour(s))  Urine culture     Status: None   Collection Time: 10/20/14  1:34 AM  Result Value Ref Range Status   Specimen Description URINE, CATHETERIZED  Final   Special Requests NONE  Final   Colony Count NO GROWTH Performed at [facility]   Final   Culture NO GROWTH Performed at [facility]   Final   Report Status 10/21/2014 FINAL  Final  MRSA PCR Screening     Status: None   Collection Time: 10/20/14  4:20 AM  Result Value Ref Range Status   MRSA by PCR NEGATIVE NEGATIVE Final    Comment:        The GeneXpert MRSA Assay (FDA approved for NASAL specimens only), is one component of a comprehensive MRSA colonization surveillance program. It is not intended to diagnose MRSA infection nor to guide or monitor treatment for MRSA infections.   Clostridium Difficile by PCR     Status: None   Collection  Time: 10/26/14  8:40 AM  Result Value Ref Range Status   C difficile by pcr NEGATIVE NEGATIVE Final     Studies: Dg Abd 1 View  10/26/2014   CLINICAL DATA:  Ileus  EXAM: ABDOMEN - 1 VIEW  COMPARISON:  10/25/2014  FINDINGS: Increased extent of gaseous prominence of colon, now 7.2 cm maximal diameter but relatively featureless. No gas-filled dilated small bowel loop. Pelvic clips are noted. Left femoral nail in place. No acute osseous finding. Nasogastric tube  tip terminates over the distal stomach, which is decompressed.  IMPRESSION: Relatively featureless borderline dilated gas-filled colon, with slight interval increase in diameter. Although this may be seen with ileus, this raises the question of underlying long-standing inflammatory bowel disease.   Electronically Signed   By: Conchita Paris M.D.   On: 10/26/2014 11:33   Dg Abd 1 View  10/25/2014   CLINICAL DATA:  Ileus, lower abdominal pain.  EXAM: ABDOMEN - 1 VIEW  COMPARISON:  10/24/2014  FINDINGS: NG tube remains in stable position in the stomach. Significant decreasing gaseous distention of the colon. Mild gaseous distention of the cecum and proximal transverse colon. No small bowel dilatation. No free air organomegaly. Prior cholecystectomy. Surgical clips along the pelvic sidewalls, presumably prior lymph node dissection.  IMPRESSION: Significant improvement in gaseous distention of the colon.   Electronically Signed   By: Rolm Baptise M.D.   On: 10/25/2014 10:41    Scheduled Meds: . allopurinol  300 mg Oral q morning - 10a  . erythromycin  250 mg Intravenous 3 times per day  . fluconazole (DIFLUCAN) IV  100 mg Intravenous Q24H  . insulin glargine  40 Units Subcutaneous QHS  . metoCLOPramide (REGLAN) injection  10 mg Intravenous 4 times per day  . metoprolol  5 mg Intravenous 4 times per day  . polyethylene glycol  17 g Oral QID  . potassium chloride  40 mEq Oral BID  . rosuvastatin  10 mg Oral QPM   Continuous Infusions: . sodium  chloride    . dextrose 5 % 1,000 mL with sodium bicarbonate 100 mEq infusion 75 mL/hr at 10/27/14 0323  . diltiazem (CARDIZEM) infusion Stopped (10/27/14 1031)  . heparin 1,250 Units/hr (10/27/14 0323)  . sodium chloride 0.9 % 1,000 mL with potassium chloride 40 mEq infusion 100 mL/hr at 10/26/14 0504    Time Spent: 25 min   Charlynne Cousins  [facility] Pager [phone]. If 7PM-7AM, please contact night-coverage at [email or web address], password [facility] 10/27/2014, 10:01 AM  LOS: 8 days

## 2014-10-28 ENCOUNTER — Inpatient Hospital Stay (HOSPITAL_COMMUNITY): Payer: Medicare Other

## 2014-10-28 LAB — BASIC METABOLIC PANEL
Anion gap: 6 (ref 5–15)
BUN: 21 mg/dL — AB (ref 6–20)
CALCIUM: 8.7 mg/dL — AB (ref 8.9–10.3)
CHLORIDE: 101 mmol/L (ref 101–111)
CO2: 25 mmol/L (ref 22–32)
CREATININE: 1.18 mg/dL (ref 0.61–1.24)
GFR, EST NON AFRICAN AMERICAN: 58 mL/min — AB (ref >60)
GLUCOSE: 108 mg/dL — AB (ref 65–99)
Potassium: 3.9 mmol/L (ref 3.5–5.1)
Sodium: 132 mmol/L — ABNORMAL LOW (ref 135–145)

## 2014-10-28 LAB — HEPARIN LEVEL (UNFRACTIONATED): Heparin Unfractionated: 0.37 IU/mL (ref 0.30–0.70)

## 2014-10-28 LAB — GLUCOSE, CAPILLARY
GLUCOSE-CAPILLARY: 106 mg/dL — AB (ref 65–99)
GLUCOSE-CAPILLARY: 118 mg/dL — AB (ref 65–99)
GLUCOSE-CAPILLARY: 126 mg/dL — AB (ref 65–99)
Glucose-Capillary: 108 mg/dL — ABNORMAL HIGH (ref 65–99)
Glucose-Capillary: 105 mg/dL — ABNORMAL HIGH (ref 65–99)
Glucose-Capillary: 105 mg/dL — ABNORMAL HIGH (ref 65–99)

## 2014-10-28 LAB — CBC
HCT: 32.3 % — ABNORMAL LOW (ref 39.0–52.0)
Hemoglobin: 10.9 g/dL — ABNORMAL LOW (ref 13.0–17.0)
MCH: 31 pg (ref 26.0–34.0)
MCHC: 33.7 g/dL (ref 30.0–36.0)
MCV: 91.8 fL (ref 78.0–100.0)
PLATELETS: 287 10*3/uL (ref 150–400)
RBC: 3.52 MIL/uL — ABNORMAL LOW (ref 4.22–5.81)
RDW: 14.5 % (ref 11.5–15.5)
WBC: 13.2 10*3/uL — AB (ref 4.0–10.5)

## 2014-10-28 LAB — MAGNESIUM: Magnesium: 1.6 mg/dL — ABNORMAL LOW (ref 1.7–2.4)

## 2014-10-28 MED ORDER — INSULIN GLARGINE 100 UNIT/ML ~~LOC~~ SOLN
20.0000 [IU] | Freq: Every day | SUBCUTANEOUS | Status: DC
  Administered 2014-10-29 – 2014-11-11 (×15): 20 [IU] via SUBCUTANEOUS
  Filled 2014-10-28 (×16): qty 0.2

## 2014-10-28 MED ORDER — POTASSIUM CHLORIDE 2 MEQ/ML IV SOLN
INTRAVENOUS | Status: DC
  Administered 2014-10-28 (×2): via INTRAVENOUS
  Filled 2014-10-28 (×3): qty 1000

## 2014-10-28 MED ORDER — METOPROLOL TARTRATE 1 MG/ML IV SOLN
5.0000 mg | Freq: Four times a day (QID) | INTRAVENOUS | Status: DC
  Filled 2014-10-28 (×4): qty 5

## 2014-10-28 MED ORDER — MAGNESIUM SULFATE 2 GM/50ML IV SOLN
2.0000 g | Freq: Once | INTRAVENOUS | Status: AC
  Administered 2014-10-28: 2 g via INTRAVENOUS
  Filled 2014-10-28 (×2): qty 50

## 2014-10-28 MED ORDER — POTASSIUM CHLORIDE 20 MEQ/15ML (10%) PO SOLN
40.0000 meq | Freq: Three times a day (TID) | ORAL | Status: DC
Start: 2014-10-28 — End: 2014-10-30
  Administered 2014-10-28 – 2014-10-29 (×5): 40 meq via ORAL
  Filled 2014-10-28 (×8): qty 30

## 2014-10-28 NOTE — Progress Notes (Signed)
10/28/2014 4:27 PM Nursing note cardizem gtt initiated due to HR sustained above 75 x 1 hr per prior verbal orders Dr. Olevia Bowens. Will continue to monitor patient.  Santosh Petter, Arville Lime

## 2014-10-28 NOTE — Progress Notes (Addendum)
4 Days Post-Op  Subjective: He is having no pain, but no flatus either. He had some liquid stool yesterday in bed but nothing else. He isn't complaining of feeling distended.  Up in chair, comfortable, no pain on palpation.  Objective: Vital signs in last 24 hours: Temp:  [98 F (36.7 C)-98.6 F (37 C)] 98.6 F (37 C) (05/30 0452) Pulse Rate:  [63-71] 63 (05/30 0452) Resp:  [18-20] 18 (05/30 0452) BP: (115-135)/(63-79) 132/79 mmHg (05/30 0452) SpO2:  [93 %-96 %] 95 % (05/30 0452) Last BM Date: 10/27/14 240 PO Clears Pt reported to have pulled NG yesterday Afebrile, VSS K+ up to 3.9 WBC still up Film this AM: The mid transverse colon now measures 9.6 cm which is a slight increase when compared with the prior exam. The cecum measures approximately 11.7 cm which is also increased from the prior exam. No free air is seen Intake/Output from previous day: 05/29 0701 - 05/30 0700 In: 240 [P.O.:240] Out: 1645 [Urine:1645] Intake/Output this shift:    General appearance: alert, cooperative and no distress GI: distended, no BS, No BM, NO flatus  Lab Results:   Recent Labs  10/27/14 0509 10/28/14 0427  WBC 14.7* 13.2*  HGB 10.9* 10.9*  HCT 32.6* 32.3*  PLT 280 287    BMET  Recent Labs  10/27/14 0509 10/28/14 0427  NA 134* 132*  K 4.2 3.9  CL 106 101  CO2 23 25  GLUCOSE 140* 108*  BUN 26* 21*  CREATININE 1.22 1.18  CALCIUM 8.7* 8.7*   PT/INR No results for input(s): LABPROT, INR in the last 72 hours.  No results for input(s): AST, ALT, ALKPHOS, BILITOT, PROT, ALBUMIN in the last 168 hours.   Lipase  No results found for: LIPASE   Studies/Results: Dg Abd 1 View  10/28/2014   CLINICAL DATA:  Abdominal distention, followup ileus  EXAM: ABDOMEN - 1 VIEW  COMPARISON:  10/27/2014  FINDINGS: Scattered large and small bowel gas is noted. Distension of the colon is again seen. The mid transverse colon now measures 9.6 cm which is a slight increase when compared with  the prior exam. The cecum measures approximately 11.7 cm which is also increased from the prior exam. No free air is seen. No other focal abnormality is noted.  IMPRESSION: Persistent and slightly worsened colonic ileus.   Electronically Signed   By: Inez Catalina M.D.   On: 10/28/2014 08:30   Dg Abd 1 View  10/27/2014   CLINICAL DATA:  Adynamic ileus.  Recent hip/ femur surgery.  EXAM: ABDOMEN - 1 VIEW  COMPARISON:  10/26/2014 and 10/25/2014 as well as 10/24/2014, CT 10/24/2014  FINDINGS: Exam demonstrates continued air-filled colonic dilatation proximal to the splenic flexure as the transverse colon measures 8.5 cm in diameter slightly worse compared to the recent prior exam. This degree of dilatation is slightly improved compared to 10/24/2014. No free peritoneal air. Surgical clips are present over the right upper quadrant and throughout the pelvis. There are a few air-filled small bowel loops in the right abdomen. Remainder of the exam is unchanged.  IMPRESSION: Slight worsening air-filled dilatation of the colon proximal to the splenic flexure. Findings may be due to colonic ileus versus intermittent distal colonic obstruction.   Electronically Signed   By: Marin Olp M.D.   On: 10/27/2014 11:00   Dg Abd 1 View  10/26/2014   CLINICAL DATA:  Ileus  EXAM: ABDOMEN - 1 VIEW  COMPARISON:  10/25/2014  FINDINGS: Increased extent of  gaseous prominence of colon, now 7.2 cm maximal diameter but relatively featureless. No gas-filled dilated small bowel loop. Pelvic clips are noted. Left femoral nail in place. No acute osseous finding. Nasogastric tube tip terminates over the distal stomach, which is decompressed.  IMPRESSION: Relatively featureless borderline dilated gas-filled colon, with slight interval increase in diameter. Although this may be seen with ileus, this raises the question of underlying long-standing inflammatory bowel disease.   Electronically Signed   By: Conchita Paris M.D.   On: 10/26/2014  11:33    Medications: . allopurinol  300 mg Oral q morning - 10a  . erythromycin  250 mg Intravenous 3 times per day  . fluconazole (DIFLUCAN) IV  100 mg Intravenous Q24H  . insulin glargine  20 Units Subcutaneous QHS  . magnesium sulfate 1 - 4 g bolus IVPB  2 g Intravenous Once  . metoCLOPramide (REGLAN) injection  10 mg Intravenous 4 times per day  . polyethylene glycol  17 g Oral QID  . potassium chloride  40 mEq Oral BID  . rosuvastatin  10 mg Oral QPM    Assessment/Plan Abdominal distension secondary to colonic ileus/film worse today Colonoscopy, diagnostic with decompression 10/24/14, Dr. Watt Climes Closed hip fx s/p left INTRAMEDULLARY (IM) NAIL INTERTROCHANTRIC, 10/20/14, Dr. [name] PAF with RVR now off Cardizem drip Hypertension AODM Stage III renal disease Body mass index is 32.   Plan:  Defer to GI, he does not have an acute abdomen at this point.    Documenting error:  Pt does not have Alzheimer's disease, I took this information from hospitalist note.  LOS: 9 days    Stefana Lodico 10/28/2014

## 2014-10-28 NOTE — Progress Notes (Signed)
ANTICOAGULATION CONSULT NOTE - Follow Up Consult  Pharmacy Consult for heparin Indication: atrial fibrillation  No Known Allergies  Patient Measurements: Wt= 101.6kg Ht= 5' 10'' IBW= 73kg Heparin dosing wt: 94kg  Vital Signs: Temp: 98.6 F (37 C) (05/30 0452) Temp Source: Oral (05/30 0452) BP: 132/79 mmHg (05/30 0452) Pulse Rate: 63 (05/30 0452)  Labs:  Recent Labs  10/25/14 1942  10/26/14 0305 10/27/14 0509 10/28/14 0427  HGB  --   < > 11.3* 10.9* 10.9*  HCT  --   --  33.7* 32.6* 32.3*  PLT  --   --  282 280 287  APTT 121*  --  81*  --   --   HEPARINUNFRC  --   --  0.57 0.45 0.37  CREATININE  --   --  1.32* 1.22 1.18  < > = values in this interval not displayed.  Estimated Creatinine Clearance: 62.6 mL/min (by C-G formula based on Cr of 1.18).   Medical History: Past Medical History  Diagnosis Date  . Diabetes mellitus type 2 in obese   . Diabetic neuropathy   . Hypertension   . GERD (gastroesophageal reflux disease)   . Gout   . Left knee DJD   . Right knee DJD   . Bilateral hearing loss     WEARS HEARING AIDS........RIGHT IS BETTER THEN LEFT  . Chronic renal insufficiency, stage III (moderate)     Creatinine 1.47  . Dysrhythmia     h/o A Fib  . On supplemental oxygen therapy     at night  . Ileus, postoperative 10/22/2014  . PONV (postoperative nausea and vomiting)     H/O ....BUT LAST FEW TIMES---NONE    Medications:  Scheduled:  . allopurinol  300 mg Oral q morning - 10a  . erythromycin  250 mg Intravenous 3 times per day  . fluconazole (DIFLUCAN) IV  100 mg Intravenous Q24H  . insulin glargine  20 Units Subcutaneous QHS  . magnesium sulfate 1 - 4 g bolus IVPB  2 g Intravenous Once  . metoCLOPramide (REGLAN) injection  10 mg Intravenous 4 times per day  . polyethylene glycol  17 g Oral QID  . potassium chloride  40 mEq Oral BID  . rosuvastatin  10 mg Oral QPM   Infusions:  . dextrose 5 % 1,000 mL with potassium chloride 40 mEq infusion     . diltiazem (CARDIZEM) infusion Stopped (10/27/14 3875)  . heparin 1,250 Units/hr (10/28/14 0016)  . sodium chloride 0.9 % 1,000 mL with potassium chloride 40 mEq infusion 100 mL/hr at 10/26/14 6433    Assessment: 79 yo male with afib on xarelto. He is s/p fall with hip fracture and repair on 5/22 and and in now noted with post-op ileus > NPO. There is concern for absorption of po Xarelto and this was discussed with Dr. Olevia Bowens changed to IV heparin.  Initial heparin level is 1.52 (xarelto influence) and aptt is below goal at 56.  Last dose of xarelto was5/24 at about 4pm.  Heparin drip 1250 uts/hr HL 0.45 at goal  HL and aPTT correlating - stop aPTT 5/28 HL today remains therapeutic No bleeding, CBC stable Pt feeling better - when taking pos - f/u restart Xarelto   Goal of Therapy:  HL 0.3-0.7 Monitor platelets by anticoagulation protocol: Yes   Plan:  Continue  heparin to 1250 units/hr -daily heparin level and CBC   Thanks for allowing pharmacy to be a part of this patient's care.  Zacarias Pontes  Lauralee Evener, PharmD Clinical Pharmacist, [phone]   10/28/2014 8:51 AM

## 2014-10-28 NOTE — Progress Notes (Signed)
10/28/2014 0900 Nursing note Verbal order Dr. Olevia Bowens restart Cardizem gtt once pt. HR sustains above 75. HR currently SR 58-65. Will continue to closely monitor patient and initiate drip when indicated.  Helem Reesor, Arville Lime

## 2014-10-28 NOTE — Progress Notes (Addendum)
Occupational Therapy Treatment Patient Details Name: Howard Charles MRN: [id number] DOB: 1935/11/08 Today's Date: 10/28/2014    History of present illness Pt is a 79 y.o. M s/p fall and L hip fx. Pt developed ileus post op and had NG placed.  Pt's PMH includes DM, diabetic neuropathy, gout, HTN, B TKA, B hearing loss, chronic renal insufficiency, on supplemental O2 at night, a fib, rotator cuff arthroscopy.   OT comments   Education provided in session. Pt wanting to walk.  Follow Up Recommendations  SNF;Supervision/Assistance - 24 hour    Equipment Recommendations  None recommended by OT    Recommendations for Other Services      Precautions / Restrictions Precautions Precautions: Fall Restrictions Weight Bearing Restrictions: Yes LLE Weight Bearing: Weight bearing as tolerated       Mobility Bed Mobility               General bed mobility comments: not assessed  Transfers Overall transfer level: Needs assistance   Transfers: Sit to/from Stand Sit to Stand: Max assist;+2 physical assistance         General transfer comment: Heavy assist to stand from chair. Cues for technique.     Balance  Min guard-Min assist for ambulation with RW.                                 ADL Overall ADL's : Needs assistance/impaired                     Lower Body Dressing: Minimal assistance;Sitting/lateral leans;With adaptive equipment (donning/doffing socks)   Toilet Transfer: Min guard;Minimal assistance;+2 for safety/equipment;Ambulation;RW;+2 for physical assistance;Maximal assistance (chair; Min guard-Min assist for ambulation; +2 Max A for sit to stand from chair)           Functional mobility during ADLs: Rolling walker;Minimal assistance;Min guard;+2 for safety/equipment General ADL Comments: Educated on AE and pt practiced with reacher and sockaid. Pt wanting to walk, so ambulated to door.  Educated on LB dressing technique.      Vision                      Perception     Praxis      Cognition  Awake/Alert Behavior During Therapy: WFL for tasks assessed/performed Overall Cognitive Status: History of cognitive impairments - at baseline                       Extremity/Trunk Assessment               Exercises     Shoulder Instructions       General Comments      Pertinent Vitals/ Pain       Pain Assessment: 0-10 Pain Score: 4  Pain Location: LLE Pain Descriptors / Indicators: Throbbing Pain Intervention(s): Repositioned;Monitored during session   HR stable in session.  Home Living                                          Prior Functioning/Environment              Frequency Min 2X/week     Progress Toward Goals  OT Goals(current goals can now be found in the care plan section)  Progress towards OT goals: Progressing toward goals  Acute Rehab OT Goals Patient Stated Goal: to walk OT Goal Formulation: With patient Time For Goal Achievement: 11/05/14 Potential to Achieve Goals: Good ADL Goals Pt Will Perform Lower Body Bathing: with mod assist;sit to/from stand Pt Will Perform Lower Body Dressing: with mod assist;sit to/from stand Pt Will Transfer to Toilet: with mod assist;bedside commode Additional ADL Goal #1: Pt will sit on EOB with S to perform grooming adls.  Plan Discharge plan remains appropriate    Co-evaluation                 End of Session Equipment Utilized During Treatment: Gait belt;Rolling walker   Activity Tolerance Patient tolerated treatment well (MD came in to speak with pt)   Patient Left in chair;with call bell/phone within reach;with family/visitor present;Other (comment) (MD present)   Nurse Communication          Time: 2256-7209 OT Time Calculation (min): 17 min  Charges: OT General Charges $OT Visit: 1 Procedure OT Treatments $Self Care/Home Management : 8-22 mins   Benito Mccreedy  OTR/L [phone] 10/28/2014, 2:45 PM

## 2014-10-28 NOTE — Progress Notes (Signed)
[facility] PROGRESS NOTE Interim History: 79 y.o. male  has a past medical history of Diabetes mellitus type 2 in obese; Diabetic neuropathy; Hypertension; GERD  Gout; Left knee DJD; Right knee DJD; PONV  Bilateral hearing loss; Chronic renal insufficiency, stage III (moderate); Dysrhythmia; and On supplemental oxygen therapy. Presented with fall while at the grocery store, Patient lost balance and fell hitting his HEAD and left hip he was not able to get up. EMS was called and he arrived to ER. Plain imaging showed Intertochanteric fracture of the left hip. Patient with hx of A.fib on Xarelto last dose this AM and coreg. On arrival to ER he was tachycardic with HR up to 130 with A.fib he was given a dose of diltiazem and improved with HR down to 80's currently converted to sinus rhythm with heart rate of 70. Develop Ileus, NG tube place, GI consult Colonoscopy done showed small areas of ischemia. CT abd and pelvis showed improved iLeus, lactic acid 1.0.  Assessment/Plan: Ileus, postoperative: - Cleared diet advance, No nausea, but no BM in the  last 24 hours. - Cont Reglan, change IV fluid to D5 with KCL. - Keep mag. > 2.0 and K >4.0.  Atrial fibrillation with RVR: - Cardiology was consulted he was started on IV diltiazem, now in sinus rhythm. Now off unclear reason resume - As the patient is currently nothing by mouth we'll switch him to IV heparin. - hbg remaines stable  Left closed hip fracture: - Orthopedic surgery was consulted and into her trochanteric IM nailing performed on 10/18/2014. - Physical therapy was consulted which recommended skilled nursing facility. Cont physical therapy.  Alzheimer disease: Very subtle.  Acute on Chronic kidney see stage III: - Most likely prerenal due to decreased oral intake. - Resolved with hydration.  Prostate cancer  Diabetes mellitus type 2 in obese: - decrease lantus - BG well controlled.  Essential  Hypertension - Bp  stable.  Code Status: Full Code Family Communication: d/w patient Disposition Plan: remain inpatient   Consultants:  GI  Procedures:  abd x-ray  Colonoscopy 5.26.2016: Multiple short discrete areas of ischemia in the sigmoid descending and transverse with normal bowel in between an air and fluid suctioned as above and no other obvious findings in unprepped exam  CT abd and pelvis 5.26.2016: Decompressed stomach, small bowel and colon following NG tube  insertion and decompression colonoscopy.  Antibiotics:  None  HPI/Subjective: Patient relates he is not nauseated and  abdominal resolved is not passing gas, no BM.  Objective: Filed Vitals:   10/27/14 0411 10/27/14 1300 10/27/14 1934 10/28/14 0452  BP: 129/60 115/63 135/79 132/79  Pulse: 62 64 71 63  Temp: 98.2 F (36.8 C)  98 F (36.7 C) 98.6 F (37 C)  TempSrc: Oral  Oral Oral  Resp: 20 20 18 18   Height:      Weight:      SpO2: 92% 93% 96% 95%    Intake/Output Summary (Last 24 hours) at 10/28/14 0807 Last data filed at 10/28/14 0659  Gross per 24 hour  Intake    240 ml  Output   1645 ml  Net  -1405 ml   Filed Weights   10/24/14 1442  Weight: 101.606 kg (224 lb)    Exam:  General: Alert, awake, oriented x3, in no acute distress.  HEENT: No bruits, no goiter.  Heart: Regular rate and rhythm. Lungs: Good air movement, bilateral air movement.  Abdomen: Soft, no tenderness, more distended, no bowel  sounds.  Neuro: Grossly intact, nonfocal.   Data Reviewed: Basic Metabolic Panel:  Recent Labs Lab 10/24/14 0448 10/25/14 0439 10/26/14 0305 10/26/14 1025 10/27/14 0509 10/28/14 0427  NA 137 136 139  --  134* 132*  K 3.6 4.3 4.7  --  4.2 3.9  CL 106 109 111  --  106 101  CO2 23 22 20*  --  23 25  GLUCOSE 136* 116* 86  --  140* 108*  BUN 38* 35* 30*  --  26* 21*  CREATININE 1.64* 1.42* 1.32*  --  1.22 1.18  CALCIUM 8.7* 8.4* 8.7*  --  8.7* 8.7*  MG 1.9  --   --  2.0  --   --   PHOS 2.2*  --    --   --   --   --    Liver Function Tests: No results for input(s): AST, ALT, ALKPHOS, BILITOT, PROT, ALBUMIN in the last 168 hours. No results for input(s): LIPASE, AMYLASE in the last 168 hours. No results for input(s): AMMONIA in the last 168 hours. CBC:  Recent Labs Lab 10/24/14 1902 10/25/14 0439 10/26/14 0305 10/27/14 0509 10/28/14 0427  WBC 19.1* 18.5* 19.1* 14.7* 13.2*  NEUTROABS 16.2*  --   --   --   --   HGB 11.9* 11.6* 11.3* 10.9* 10.9*  HCT 34.8* 34.2* 33.7* 32.6* 32.3*  MCV 92.3 92.2 92.1 92.6 91.8  PLT 237 246 282 280 287   Cardiac Enzymes: No results for input(s): CKTOTAL, CKMB, CKMBINDEX, TROPONINI in the last 168 hours. BNP (last 3 results) No results for input(s): BNP in the last 8760 hours.  ProBNP (last 3 results) No results for input(s): PROBNP in the last 8760 hours.  CBG:  Recent Labs Lab 10/27/14 1152 10/27/14 1625 10/27/14 2035 10/28/14 0041 10/28/14 0403  GLUCAP 110* 180* 147* 118* 108*    Recent Results (from the past 240 hour(s))  Urine culture     Status: None   Collection Time: 10/20/14  1:34 AM  Result Value Ref Range Status   Specimen Description URINE, CATHETERIZED  Final   Special Requests NONE  Final   Colony Count NO GROWTH Performed at [facility]   Final   Culture NO GROWTH Performed at [facility]   Final   Report Status 10/21/2014 FINAL  Final  MRSA PCR Screening     Status: None   Collection Time: 10/20/14  4:20 AM  Result Value Ref Range Status   MRSA by PCR NEGATIVE NEGATIVE Final    Comment:        The GeneXpert MRSA Assay (FDA approved for NASAL specimens only), is one component of a comprehensive MRSA colonization surveillance program. It is not intended to diagnose MRSA infection nor to guide or monitor treatment for MRSA infections.   Clostridium Difficile by PCR     Status: None   Collection Time: 10/26/14  8:40 AM  Result Value Ref Range Status   C difficile by pcr NEGATIVE  NEGATIVE Final     Studies: Dg Abd 1 View  10/27/2014   CLINICAL DATA:  Adynamic ileus.  Recent hip/ femur surgery.  EXAM: ABDOMEN - 1 VIEW  COMPARISON:  10/26/2014 and 10/25/2014 as well as 10/24/2014, CT 10/24/2014  FINDINGS: Exam demonstrates continued air-filled colonic dilatation proximal to the splenic flexure as the transverse colon measures 8.5 cm in diameter slightly worse compared to the recent prior exam. This degree of dilatation is slightly improved compared to 10/24/2014. No  free peritoneal air. Surgical clips are present over the right upper quadrant and throughout the pelvis. There are a few air-filled small bowel loops in the right abdomen. Remainder of the exam is unchanged.  IMPRESSION: Slight worsening air-filled dilatation of the colon proximal to the splenic flexure. Findings may be due to colonic ileus versus intermittent distal colonic obstruction.   Electronically Signed   By: Marin Olp M.D.   On: 10/27/2014 11:00   Dg Abd 1 View  10/26/2014   CLINICAL DATA:  Ileus  EXAM: ABDOMEN - 1 VIEW  COMPARISON:  10/25/2014  FINDINGS: Increased extent of gaseous prominence of colon, now 7.2 cm maximal diameter but relatively featureless. No gas-filled dilated small bowel loop. Pelvic clips are noted. Left femoral nail in place. No acute osseous finding. Nasogastric tube tip terminates over the distal stomach, which is decompressed.  IMPRESSION: Relatively featureless borderline dilated gas-filled colon, with slight interval increase in diameter. Although this may be seen with ileus, this raises the question of underlying long-standing inflammatory bowel disease.   Electronically Signed   By: Conchita Paris M.D.   On: 10/26/2014 11:33    Scheduled Meds: . allopurinol  300 mg Oral q morning - 10a  . erythromycin  250 mg Intravenous 3 times per day  . fluconazole (DIFLUCAN) IV  100 mg Intravenous Q24H  . insulin glargine  40 Units Subcutaneous QHS  . metoCLOPramide (REGLAN) injection   10 mg Intravenous 4 times per day  . metoprolol  5 mg Intravenous 4 times per day  . polyethylene glycol  17 g Oral QID  . potassium chloride  40 mEq Oral BID  . rosuvastatin  10 mg Oral QPM   Continuous Infusions: . sodium chloride    . dextrose 5 % 1,000 mL with sodium bicarbonate 100 mEq infusion 75 mL/hr at 10/28/14 0014  . diltiazem (CARDIZEM) infusion Stopped (10/27/14 9373)  . heparin 1,250 Units/hr (10/28/14 0016)  . sodium chloride 0.9 % 1,000 mL with potassium chloride 40 mEq infusion 100 mL/hr at 10/26/14 0504    Time Spent: 25 min   Charlynne Cousins  [facility] Pager [phone]. If 7PM-7AM, please contact night-coverage at [email or web address], password [facility] 10/28/2014, 8:07 AM  LOS: 9 days

## 2014-10-28 NOTE — Progress Notes (Signed)
Pt had 4 runs of V-tach at 0504, pt denies any discomfort at this time, NP Kathline Magic paged and notified,ordered a blood Magnesium level, v/s stable, will continue to monitor. [name], Howard Charles Efe

## 2014-10-28 NOTE — Progress Notes (Signed)
Patient is basically stable on clear liquid diet. Although his official x-ray report suggests some increase in his transverse colonic thickening, on my review, I think that is equivocal, certainly nothing dramatic. Clinically, the patient is unchanged. No nausea, vomiting, severe pain, or any bowel movements or flatulence.  His potassium is slightly lower today, at 3.9, despite ongoing high-dose oral supplementation.  On exam, the abdomen has quiet bowel sounds and persistent tympany in the epigastric region. The patient is not in any distress, but seems to be becoming understandably frustrated with his slow progress from the GI tract standpoint.  Impression: Colonic ileus, lingering, status post decompressive procedure several days ago  Plan: Continue current management. Monitor potassium, monitor radiographic appearance of the colon. Lengthy discussion with patient and family regarding the protracted nature of ileus but the fact that this almost always resolves in due time, which could be weeks.. At present, I see no need to withhold clear liquids; in fact, these may actually help stimulate intestinal motility. In fact, I will consider advancing to a low-residue diet tomorrow if his x-ray does not look worse and if he has no progression of symptoms.  My signature

## 2014-10-28 NOTE — Progress Notes (Signed)
10/28/2014 1500 Confirmed with Dr. Cristina Gong ok for pt. To continue clear liquid diet and Po intake. Pt. Updated on plan of care.  Delton Stelle, Arville Lime

## 2014-10-29 ENCOUNTER — Inpatient Hospital Stay (HOSPITAL_COMMUNITY): Payer: Medicare Other

## 2014-10-29 LAB — GLUCOSE, CAPILLARY
GLUCOSE-CAPILLARY: 82 mg/dL (ref 65–99)
GLUCOSE-CAPILLARY: 174 mg/dL — AB (ref 65–99)
GLUCOSE-CAPILLARY: 135 mg/dL — AB (ref 65–99)
Glucose-Capillary: 128 mg/dL — ABNORMAL HIGH (ref 65–99)
Glucose-Capillary: 128 mg/dL — ABNORMAL HIGH (ref 65–99)
Glucose-Capillary: 163 mg/dL — ABNORMAL HIGH (ref 65–99)
Glucose-Capillary: 207 mg/dL — ABNORMAL HIGH (ref 65–99)

## 2014-10-29 LAB — BASIC METABOLIC PANEL
Anion gap: 6 (ref 5–15)
BUN: 18 mg/dL (ref 6–20)
CO2: 25 mmol/L (ref 22–32)
Calcium: 8.9 mg/dL (ref 8.9–10.3)
Chloride: 102 mmol/L (ref 101–111)
Creatinine, Ser: 1.27 mg/dL — ABNORMAL HIGH (ref 0.61–1.24)
GFR, EST NON AFRICAN AMERICAN: 53 mL/min — AB (ref >60)
GLUCOSE: 124 mg/dL — AB (ref 65–99)
POTASSIUM: 4.7 mmol/L (ref 3.5–5.1)
SODIUM: 133 mmol/L — AB (ref 135–145)

## 2014-10-29 LAB — CBC
HCT: 32.8 % — ABNORMAL LOW (ref 39.0–52.0)
Hemoglobin: 11.1 g/dL — ABNORMAL LOW (ref 13.0–17.0)
MCH: 31 pg (ref 26.0–34.0)
MCHC: 33.8 g/dL (ref 30.0–36.0)
MCV: 91.6 fL (ref 78.0–100.0)
PLATELETS: 308 10*3/uL (ref 150–400)
RBC: 3.58 MIL/uL — ABNORMAL LOW (ref 4.22–5.81)
RDW: 14.6 % (ref 11.5–15.5)
WBC: 12.4 10*3/uL — ABNORMAL HIGH (ref 4.0–10.5)

## 2014-10-29 LAB — HEPARIN LEVEL (UNFRACTIONATED): Heparin Unfractionated: 0.4 IU/mL (ref 0.30–0.70)

## 2014-10-29 MED ORDER — SODIUM CHLORIDE 0.9 % IV SOLN
INTRAVENOUS | Status: DC
Start: 2014-10-29 — End: 2014-11-09
  Administered 2014-10-29 – 2014-11-09 (×3): via INTRAVENOUS

## 2014-10-29 NOTE — Progress Notes (Signed)
ANTICOAGULATION CONSULT NOTE - Follow Up Consult  Pharmacy Consult for heparin Indication: atrial fibrillation  No Known Allergies  Patient Measurements: Wt= 101.6kg Ht= 5' 10'' IBW= 73kg Heparin dosing wt: 94kg  Vital Signs: Temp: 98.2 F (36.8 C) (05/31 0419) Temp Source: Oral (05/31 0419) BP: 127/57 mmHg (05/31 0419) Pulse Rate: 63 (05/31 0419)  Labs:  Recent Labs  10/27/14 0509 10/28/14 0427 10/29/14 0329  HGB 10.9* 10.9* 11.1*  HCT 32.6* 32.3* 32.8*  PLT 280 287 308  HEPARINUNFRC 0.45 0.37 0.40  CREATININE 1.22 1.18 1.27*    Estimated Creatinine Clearance: 58.1 mL/min (by C-G formula based on Cr of 1.27).   Medical History: Past Medical History  Diagnosis Date  . Diabetes mellitus type 2 in obese   . Diabetic neuropathy   . Hypertension   . GERD (gastroesophageal reflux disease)   . Gout   . Left knee DJD   . Right knee DJD   . Bilateral hearing loss     WEARS HEARING AIDS........RIGHT IS BETTER THEN LEFT  . Chronic renal insufficiency, stage III (moderate)     Creatinine 1.47  . Dysrhythmia     h/o A Fib  . On supplemental oxygen therapy     at night  . Ileus, postoperative 10/22/2014  . PONV (postoperative nausea and vomiting)     H/O ....BUT LAST FEW TIMES---NONE    Medications:  Scheduled:  . allopurinol  300 mg Oral q morning - 10a  . erythromycin  250 mg Intravenous 3 times per day  . fluconazole (DIFLUCAN) IV  100 mg Intravenous Q24H  . insulin glargine  20 Units Subcutaneous QHS  . metoCLOPramide (REGLAN) injection  10 mg Intravenous 4 times per day  . polyethylene glycol  17 g Oral QID  . potassium chloride  40 mEq Oral TID  . rosuvastatin  10 mg Oral QPM   Infusions:  . sodium chloride    . diltiazem (CARDIZEM) infusion 5 mg/hr (10/28/14 1620)  . heparin 1,250 Units/hr (10/29/14 0005)    Assessment: 79 yo male with hx afib on xarelto PTA. He is s/p fall with hip fracture and repair on 5/22 and and in now noted with post-op  ileus > NPO. There is concern for absorption of po Xarelto and this was discussed with Dr. Olevia Bowens changed to IV heparin.  Last dose of xarelto was5/24 at about 4pm.   Heparin level 0.4 on 1250 units/hr  Noted advance of diet to clears with 3 BMs in the last 24 hours.  Goal of Therapy:  HL 0.3-0.7 Monitor platelets by anticoagulation protocol: Yes   Plan:  Continue Heparin at 1250 units/hr.  Follow up toleration of diet and restart Xarelto (maybe 6/1) Daily Heparin level, CBC ?LOT of Fluconazole, today is day#6 - I can't find an indication for this ?Reduce frequency of Miralax if ileus resolves - currently 4xd.   [employer], Pharm.D., BCPS Clinical Pharmacist Pager [phone] 10/29/2014 9:47 AM

## 2014-10-29 NOTE — Progress Notes (Signed)
5 Days Post-Op  Subjective: He reports 2-3 BM's since yesterday, his diet is up to full liquids.  He is in the hallway and PT is working with him.  Abd still distended but over all seems better.    Objective: Vital signs in last 24 hours: Temp:  [98.2 F (36.8 C)-99.5 F (37.5 C)] 98.2 F (36.8 C) (05/31 0419) Pulse Rate:  [63-75] 63 (05/31 0419) Resp:  [18] 18 (05/31 0419) BP: (122-127)/(57-90) 127/57 mmHg (05/31 0419) SpO2:  [94 %-98 %] 94 % (05/31 0419) Last BM Date: 10/29/14 360 PO Full liquid diet Afebrile, VSS K+ 4.7 Film better Intake/Output from previous day: 05/30 0701 - 05/31 0700 In: 360 [P.O.:360] Out: 2550 [Urine:2550] Intake/Output this shift:    General appearance: alert, cooperative and no distress GI: still large distended abdomen, but he feels better, few Bs, and he is having more stools.  Lab Results:   Recent Labs  10/28/14 0427 10/29/14 0329  WBC 13.2* 12.4*  HGB 10.9* 11.1*  HCT 32.3* 32.8*  PLT 287 308    BMET  Recent Labs  10/28/14 0427 10/29/14 0329  NA 132* 133*  K 3.9 4.7  CL 101 102  CO2 25 25  GLUCOSE 108* 124*  BUN 21* 18  CREATININE 1.18 1.27*  CALCIUM 8.7* 8.9   PT/INR No results for input(s): LABPROT, INR in the last 72 hours.  No results for input(s): AST, ALT, ALKPHOS, BILITOT, PROT, ALBUMIN in the last 168 hours.   Lipase  No results found for: LIPASE   Studies/Results: Dg Abd 1 View  10/29/2014   CLINICAL DATA:  Abdominal pain.  Ileus.  EXAM: ABDOMEN - 1 VIEW  COMPARISON:  10/28/2014  FINDINGS: Last diffuse gaseous distention of the colon is again demonstrated. Cecum measures approximately 10 cm in diameter which is slightly decreased since previous study. No definite small bowel dilatation seen. Pelvic surgical clips again seen from prior prostatectomy and iliac lymph node dissection.  IMPRESSION: Diffuse colonic distention shows mild improvement since previous study.   Electronically Signed   By: Earle Gell  M.D.   On: 10/29/2014 08:32   Dg Abd 1 View  10/28/2014   CLINICAL DATA:  Abdominal distention, followup ileus  EXAM: ABDOMEN - 1 VIEW  COMPARISON:  10/27/2014  FINDINGS: Scattered large and small bowel gas is noted. Distension of the colon is again seen. The mid transverse colon now measures 9.6 cm which is a slight increase when compared with the prior exam. The cecum measures approximately 11.7 cm which is also increased from the prior exam. No free air is seen. No other focal abnormality is noted.  IMPRESSION: Persistent and slightly worsened colonic ileus.   Electronically Signed   By: Inez Catalina M.D.   On: 10/28/2014 08:30   Dg Abd 1 View  10/27/2014   CLINICAL DATA:  Adynamic ileus.  Recent hip/ femur surgery.  EXAM: ABDOMEN - 1 VIEW  COMPARISON:  10/26/2014 and 10/25/2014 as well as 10/24/2014, CT 10/24/2014  FINDINGS: Exam demonstrates continued air-filled colonic dilatation proximal to the splenic flexure as the transverse colon measures 8.5 cm in diameter slightly worse compared to the recent prior exam. This degree of dilatation is slightly improved compared to 10/24/2014. No free peritoneal air. Surgical clips are present over the right upper quadrant and throughout the pelvis. There are a few air-filled small bowel loops in the right abdomen. Remainder of the exam is unchanged.  IMPRESSION: Slight worsening air-filled dilatation of the colon proximal to  the splenic flexure. Findings may be due to colonic ileus versus intermittent distal colonic obstruction.   Electronically Signed   By: Marin Olp M.D.   On: 10/27/2014 11:00    Medications: . allopurinol  300 mg Oral q morning - 10a  . erythromycin  250 mg Intravenous 3 times per day  . fluconazole (DIFLUCAN) IV  100 mg Intravenous Q24H  . insulin glargine  20 Units Subcutaneous QHS  . metoCLOPramide (REGLAN) injection  10 mg Intravenous 4 times per day  . polyethylene glycol  17 g Oral QID  . potassium chloride  40 mEq Oral TID   . rosuvastatin  10 mg Oral QPM   . sodium chloride    . diltiazem (CARDIZEM) infusion 5 mg/hr (10/28/14 1620)  . heparin 1,250 Units/hr (10/29/14 0005)    Assessment/Plan Abdominal distension secondary to colonic ileus Colonoscopy, diagnostic with decompression 10/24/14, Dr. Watt Climes Closed hip fx s/p left INTRAMEDULLARY (IM) NAIL INTERTROCHANTRIC, 10/20/14, Dr. [name] PAF with RVR now off Cardizem drip Hypertension AODM Stage III renal disease Alzheimer's disease Body mass index is 32. DVT: heparin     plan:  No surgical issues currently.  Call if we can assist.   LOS: 10 days    Howard Charles 10/29/2014

## 2014-10-29 NOTE — Progress Notes (Signed)
Physical Therapy Treatment Patient Details Name: Howard Charles MRN: [id number] DOB: 1935/11/03 Today's Date: 10/29/2014    History of Present Illness Pt is a 79 y/o M s/p fall and L hip fx.  Pt's PMH includes DM, diabetic neuropathy, gout, HTN, B TKA, B hearing loss, chronic renal insufficiency, on supplemental O2 at night, a fib, rotator cuff arthroscopy.    PT Comments    Pt progressing slowly towards physical therapy goals. Pt was educated on sequencing and technique for transfers and ambulation, as nursing reports extreme difficulty assisting pt to the [facility] earlier today. Pt requires increased time to complete tasks due to pain and decreased tolerance for functional activity. Continue to feel that SNF is the safest option at d/c.   Follow Up Recommendations  SNF;Supervision for mobility/OOB     Equipment Recommendations  Rolling walker with 5" wheels    Recommendations for Other Services       Precautions / Restrictions Precautions Precautions: Fall Restrictions Weight Bearing Restrictions: Yes LLE Weight Bearing: Weight bearing as tolerated    Mobility  Bed Mobility Overal bed mobility: Needs Assistance Bed Mobility: Supine to Sit     Supine to sit: Mod assist;+2 for physical assistance     General bed mobility comments: Assist w/ managing BLEs and using bed pad to bring hips to sitting EOB.  Pt w/ heavy use of bed rails and required max verbal cues during supine<>sit.    Transfers Overall transfer level: Needs assistance Equipment used: Rolling walker (2 wheeled) Transfers: Sit to/from Stand Sit to Stand: Mod assist;+2 physical assistance;From elevated surface         General transfer comment: Assist to power-up to full standing position. Increased time to gain and maintain balance prior to initiating gait training.   Ambulation/Gait Ambulation/Gait assistance: Min assist Ambulation Distance (Feet): 60 Feet Assistive device: Rolling walker (2  wheeled) Gait Pattern/deviations: Step-through pattern;Decreased stride length;Decreased dorsiflexion - left;Trunk flexed Gait velocity: Decreased Gait velocity interpretation: Below normal speed for age/gender General Gait Details: Pt was cued for increased heel strike, improved posture, and for distance. 1 seated rest break required.    Stairs            Wheelchair Mobility    Modified Rankin (Stroke Patients Only)       Balance Overall balance assessment: Needs assistance;History of Falls Sitting-balance support: Feet supported;No upper extremity supported Sitting balance-Leahy Scale: Poor     Standing balance support: Bilateral upper extremity supported Standing balance-Leahy Scale: Poor                      Cognition Arousal/Alertness: Awake/alert Behavior During Therapy: WFL for tasks assessed/performed Overall Cognitive Status: History of cognitive impairments - at baseline                      Exercises      General Comments General comments (skin integrity, edema, etc.): Encouraged active movement of the LLE as much as possible - pt continues to be unable to lift the leg independently. Pt was educated in using the RLE to help move the LLE during transfers to increase independence.       Pertinent Vitals/Pain Pain Assessment: Faces Faces Pain Scale: Hurts whole lot Pain Location: LLE with mobility Pain Descriptors / Indicators: Sharp Pain Intervention(s): Limited activity within patient's tolerance;Monitored during session;Repositioned    Home Living  Prior Function            PT Goals (current goals can now be found in the care plan section) Acute Rehab PT Goals Patient Stated Goal: to get rid of some of this pain. PT Goal Formulation: With patient Time For Goal Achievement: 10/28/14 Potential to Achieve Goals: Good Progress towards PT goals: Progressing toward goals    Frequency  Min 3X/week     PT Plan Current plan remains appropriate    Co-evaluation             End of Session Equipment Utilized During Treatment: Gait belt Activity Tolerance: Patient limited by pain;Patient limited by fatigue Patient left: in chair;with call bell/phone within reach;with family/visitor present     Time: 1022-1048 PT Time Calculation (min) (ACUTE ONLY): 26 min  Charges:  $Gait Training: 8-22 mins $Therapeutic Activity: 8-22 mins                    G Codes:      Rolinda Roan 11-20-14, 12:04 PM   Rolinda Roan, PT, DPT Acute Rehabilitation Services Pager: [phone]

## 2014-10-29 NOTE — Progress Notes (Signed)
[facility] Progress Note  Subjective: Patient complaining of hip pain no abdominal pain or nausea, tolerating clear liquid diet. Passing some stool and flatus last night but none today  Objective: Vital signs in last 24 hours: Temp:  [98.2 F (36.8 C)-99.5 F (37.5 C)] 98.2 F (36.8 C) (05/31 0419) Pulse Rate:  [63-75] 63 (05/31 0419) Resp:  [18] 18 (05/31 0419) BP: (122-127)/(57-90) 127/57 mmHg (05/31 0419) SpO2:  [94 %-98 %] 94 % (05/31 0419) Weight change:    PE: Abdomen symmetrically distended positive bowel sounds  Lab Results: Results for orders placed or performed during the hospital encounter of 10/19/14 (from the past 24 hour(s))  Glucose, capillary     Status: Abnormal   Collection Time: 10/28/14  9:16 AM  Result Value Ref Range   Glucose-Capillary 105 (H) 65 - 99 mg/dL  Glucose, capillary     Status: Abnormal   Collection Time: 10/28/14 11:57 AM  Result Value Ref Range   Glucose-Capillary 105 (H) 65 - 99 mg/dL   Comment 1 Notify RN    Comment 2 Document in Chart   Glucose, capillary     Status: Abnormal   Collection Time: 10/28/14  4:36 PM  Result Value Ref Range   Glucose-Capillary 106 (H) 65 - 99 mg/dL  Glucose, capillary     Status: Abnormal   Collection Time: 10/28/14  8:28 PM  Result Value Ref Range   Glucose-Capillary 126 (H) 65 - 99 mg/dL  Glucose, capillary     Status: Abnormal   Collection Time: 10/29/14  2:13 AM  Result Value Ref Range   Glucose-Capillary 128 (H) 65 - 99 mg/dL  CBC     Status: Abnormal   Collection Time: 10/29/14  3:29 AM  Result Value Ref Range   WBC 12.4 (H) 4.0 - 10.5 K/uL   RBC 3.58 (L) 4.22 - 5.81 MIL/uL   Hemoglobin 11.1 (L) 13.0 - 17.0 g/dL   HCT 32.8 (L) 39.0 - 52.0 %   MCV 91.6 78.0 - 100.0 fL   MCH 31.0 26.0 - 34.0 pg   MCHC 33.8 30.0 - 36.0 g/dL   RDW 14.6 11.5 - 15.5 %   Platelets 308 150 - 400 K/uL  Heparin level (unfractionated)     Status: None   Collection Time: 10/29/14  3:29 AM  Result Value  Ref Range   Heparin Unfractionated 0.40 0.30 - 0.70 IU/mL  Basic metabolic panel     Status: Abnormal   Collection Time: 10/29/14  3:29 AM  Result Value Ref Range   Sodium 133 (L) 135 - 145 mmol/L   Potassium 4.7 3.5 - 5.1 mmol/L   Chloride 102 101 - 111 mmol/L   CO2 25 22 - 32 mmol/L   Glucose, Bld 124 (H) 65 - 99 mg/dL   BUN 18 6 - 20 mg/dL   Creatinine, Ser 1.27 (H) 0.61 - 1.24 mg/dL   Calcium 8.9 8.9 - 10.3 mg/dL   GFR calc non Af Amer 53 (L) >60 mL/min   GFR calc Af Amer >60 >60 mL/min   Anion gap 6 5 - 15  Glucose, capillary     Status: Abnormal   Collection Time: 10/29/14  4:09 AM  Result Value Ref Range   Glucose-Capillary 128 (H) 65 - 99 mg/dL  Glucose, capillary     Status: Abnormal   Collection Time: 10/29/14  8:44 AM  Result Value Ref Range   Glucose-Capillary 163 (H) 65 - 99 mg/dL    Studies/Results: Dg Abd  1 View  10/29/2014   CLINICAL DATA:  Abdominal pain.  Ileus.  EXAM: ABDOMEN - 1 VIEW  COMPARISON:  10/28/2014  FINDINGS: Last diffuse gaseous distention of the colon is again demonstrated. Cecum measures approximately 10 cm in diameter which is slightly decreased since previous study. No definite small bowel dilatation seen. Pelvic surgical clips again seen from prior prostatectomy and iliac lymph node dissection.  IMPRESSION: Diffuse colonic distention shows mild improvement since previous study.   Electronically Signed   By: Earle Gell M.D.   On: 10/29/2014 08:32   Dg Abd 1 View  10/28/2014   CLINICAL DATA:  Abdominal distention, followup ileus  EXAM: ABDOMEN - 1 VIEW  COMPARISON:  10/27/2014  FINDINGS: Scattered large and small bowel gas is noted. Distension of the colon is again seen. The mid transverse colon now measures 9.6 cm which is a slight increase when compared with the prior exam. The cecum measures approximately 11.7 cm which is also increased from the prior exam. No free air is seen. No other focal abnormality is noted.  IMPRESSION: Persistent and  slightly worsened colonic ileus.   Electronically Signed   By: Inez Catalina M.D.   On: 10/28/2014 08:30   Dg Abd 1 View  10/27/2014   CLINICAL DATA:  Adynamic ileus.  Recent hip/ femur surgery.  EXAM: ABDOMEN - 1 VIEW  COMPARISON:  10/26/2014 and 10/25/2014 as well as 10/24/2014, CT 10/24/2014  FINDINGS: Exam demonstrates continued air-filled colonic dilatation proximal to the splenic flexure as the transverse colon measures 8.5 cm in diameter slightly worse compared to the recent prior exam. This degree of dilatation is slightly improved compared to 10/24/2014. No free peritoneal air. Surgical clips are present over the right upper quadrant and throughout the pelvis. There are a few air-filled small bowel loops in the right abdomen. Remainder of the exam is unchanged.  IMPRESSION: Slight worsening air-filled dilatation of the colon proximal to the splenic flexure. Findings may be due to colonic ileus versus intermittent distal colonic obstruction.   Electronically Signed   By: Marin Olp M.D.   On: 10/27/2014 11:00      Assessment: Postoperative ileus status post endoscopic decompression last week, slight improvement on today's KUB compared to 2 days ago  Plan: Continue current measures and will offer full liquid diet    Georgeanne Frankland C 10/29/2014, 9:11 AM  Pager [phone] If no answer or after 5 PM call [phone]

## 2014-10-29 NOTE — Progress Notes (Addendum)
     Subjective:  Patient reports pain as moderate.  He has been able to ambulate out in the hall, although complains of more hip pain than before, still having some abdominal bloating. He did have a bowel movement yesterday, but nothing yet today.  Objective:   VITALS:   Filed Vitals:   10/28/14 0452 10/28/14 1255 10/28/14 1934 10/29/14 0419  BP: 132/79 126/73 122/90 127/57  Pulse: 63 75 73 63  Temp: 98.6 F (37 C) 99.5 F (37.5 C) 99 F (37.2 C) 98.2 F (36.8 C)  TempSrc: Oral Oral Oral Oral  Resp: 18 18 18 18   Height:      Weight:      SpO2: 95% 98% 95% 94%    Neurologically intact Dorsiflexion/Plantar flexion intact Incision: dressing C/D/I Abdomen still bloated, distended, although soft, no rebound or guarding.  Lab Results  Component Value Date   WBC 12.4* 10/29/2014   HGB 11.1* 10/29/2014   HCT 32.8* 10/29/2014   MCV 91.6 10/29/2014   PLT 308 10/29/2014   BMET    Component Value Date/Time   NA 133* 10/29/2014 0329   K 4.7 10/29/2014 0329   CL 102 10/29/2014 0329   CO2 25 10/29/2014 0329   GLUCOSE 124* 10/29/2014 0329   BUN 18 10/29/2014 0329   CREATININE 1.27* 10/29/2014 0329   CALCIUM 8.9 10/29/2014 0329   GFRNONAA 53* 10/29/2014 0329   GFRAA >60 10/29/2014 0329     Assessment/Plan: 5 Days Post-Op   Principal Problem:   Closed left hip fracture Active Problems:   Chronic renal insufficiency, stage III (moderate)   Atrial fibrillation with RVR   Diabetes mellitus type 2 in obese   Hypertension   A-fib   Prostate cancer   Ileus, postoperative   Up with therapy Disposition per primary team. He is overall doing well from an orthopedic standpoint. I would recommend a total of 3 weeks of anticoagulation for DVT prophylaxis, and minimal narcotics if possible to minimize GI impact.   Demetrius Mahler P 10/29/2014, 10:59 AM   ADDENDUM:  PATIENT DOES NOT HAVE ALZHEIMER'S.  INCORRECTLY PULLED FORWARD FROM HISTORICAL INACCURATE NOTE.  THIS NOTE  HAS BEEN CORRECTED.

## 2014-10-29 NOTE — Progress Notes (Addendum)
[facility] PROGRESS NOTE Interim History: 79 y.o. male  has a past medical history of Diabetes mellitus type 2 in obese; Diabetic neuropathy; Hypertension; GERD  Gout; Left knee DJD; Right knee DJD; PONV  Bilateral hearing loss; Chronic renal insufficiency, stage III (moderate); Dysrhythmia; and On supplemental oxygen therapy. Presented with fall while at the grocery store, Patient lost balance and fell hitting his HEAD and left hip he was not able to get up. EMS was called and he arrived to ER. Plain imaging showed Intertochanteric fracture of the left hip. Patient with hx of A.fib on Xarelto last dose this AM and coreg. On arrival to ER he was tachycardic with HR up to 130 with A.fib he was given a dose of diltiazem and improved with HR down to 80's currently converted to sinus rhythm with heart rate of 70. Develop Ileus, NG tube place, GI consult Colonoscopy done showed small areas of ischemia. CT abd and pelvis showed improved iLeus, lactic acid 1.0.  Assessment/Plan: Ileus, postoperative: - Advance full liquid, had several BM - Cont Reglan, change IV fluid to NS inf. - Keep mag. > 2.0 and K >4.0.  Atrial fibrillation with RVR: - Cardiology was consulted he was started on IV diltiazem, now in sinus rhythm. When able to take orals can change diltiazem. - Cont IV hepatin IV heparin, probably can change to NOAC's in am. - hbg remaines stable  Left closed hip fracture: - Orthopedic surgery was consulted and into her trochanteric IM nailing performed on 10/18/2014. - Physical therapy was consulted which recommended skilled nursing facility. Cont physical therapy.  Alzheimer disease: Very subtle.  Acute on Chronic kidney see stage III: - Most likely prerenal due to decreased oral intake. - Resume IV hydration not able to keep up with needs, mild increase in Cr.  Prostate cancer  Diabetes mellitus type 2 in obese: - decrease lantus - BG well controlled.  Essential   Hypertension - Bp stable.  Code Status: Full Code Family Communication: d/w patient Disposition Plan: home in 1-2 days   Consultants:  GI  Procedures:  abd x-ray  Colonoscopy 5.26.2016: Multiple short discrete areas of ischemia in the sigmoid descending and transverse with normal bowel in between an air and fluid suctioned as above and no other obvious findings in unprepped exam  CT abd and pelvis 5.26.2016: Decompressed stomach, small bowel and colon following NG tube  insertion and decompression colonoscopy.  Antibiotics:  None  HPI/Subjective: Several BM in the last 24 hrs  Objective: Filed Vitals:   10/28/14 0452 10/28/14 1255 10/28/14 1934 10/29/14 0419  BP: 132/79 126/73 122/90 127/57  Pulse: 63 75 73 63  Temp: 98.6 F (37 C) 99.5 F (37.5 C) 99 F (37.2 C) 98.2 F (36.8 C)  TempSrc: Oral Oral Oral Oral  Resp: 18 18 18 18   Height:      Weight:      SpO2: 95% 98% 95% 94%    Intake/Output Summary (Last 24 hours) at 10/29/14 0925 Last data filed at 10/29/14 0315  Gross per 24 hour  Intake    240 ml  Output   1900 ml  Net  -1660 ml   Filed Weights   10/24/14 1442  Weight: 101.606 kg (224 lb)    Exam:  General: Alert, awake, oriented x3, in no acute distress.  HEENT: No bruits, no goiter.  Heart: Regular rate and rhythm. Lungs: Good air movement,clear Abdomen: Soft, no tenderness, still distended, no bowel sounds.  Neuro: Grossly intact,  nonfocal.   Data Reviewed: Basic Metabolic Panel:  Recent Labs Lab 10/24/14 0448 10/25/14 0439 10/26/14 0305 10/26/14 1025 10/27/14 0509 10/28/14 0427 10/29/14 0329  NA 137 136 139  --  134* 132* 133*  K 3.6 4.3 4.7  --  4.2 3.9 4.7  CL 106 109 111  --  106 101 102  CO2 23 22 20*  --  23 25 25   GLUCOSE 136* 116* 86  --  140* 108* 124*  BUN 38* 35* 30*  --  26* 21* 18  CREATININE 1.64* 1.42* 1.32*  --  1.22 1.18 1.27*  CALCIUM 8.7* 8.4* 8.7*  --  8.7* 8.7* 8.9  MG 1.9  --   --  2.0  --  1.6*  --    PHOS 2.2*  --   --   --   --   --   --    Liver Function Tests: No results for input(s): AST, ALT, ALKPHOS, BILITOT, PROT, ALBUMIN in the last 168 hours. No results for input(s): LIPASE, AMYLASE in the last 168 hours. No results for input(s): AMMONIA in the last 168 hours. CBC:  Recent Labs Lab 10/24/14 1902 10/25/14 0439 10/26/14 0305 10/27/14 0509 10/28/14 0427 10/29/14 0329  WBC 19.1* 18.5* 19.1* 14.7* 13.2* 12.4*  NEUTROABS 16.2*  --   --   --   --   --   HGB 11.9* 11.6* 11.3* 10.9* 10.9* 11.1*  HCT 34.8* 34.2* 33.7* 32.6* 32.3* 32.8*  MCV 92.3 92.2 92.1 92.6 91.8 91.6  PLT 237 246 282 280 287 308   Cardiac Enzymes: No results for input(s): CKTOTAL, CKMB, CKMBINDEX, TROPONINI in the last 168 hours. BNP (last 3 results) No results for input(s): BNP in the last 8760 hours.  ProBNP (last 3 results) No results for input(s): PROBNP in the last 8760 hours.  CBG:  Recent Labs Lab 10/28/14 1636 10/28/14 2028 10/29/14 0213 10/29/14 0409 10/29/14 0844  GLUCAP 106* 126* 128* 128* 163*    Recent Results (from the past 240 hour(s))  Urine culture     Status: None   Collection Time: 10/20/14  1:34 AM  Result Value Ref Range Status   Specimen Description URINE, CATHETERIZED  Final   Special Requests NONE  Final   Colony Count NO GROWTH Performed at [facility]   Final   Culture NO GROWTH Performed at [facility]   Final   Report Status 10/21/2014 FINAL  Final  MRSA PCR Screening     Status: None   Collection Time: 10/20/14  4:20 AM  Result Value Ref Range Status   MRSA by PCR NEGATIVE NEGATIVE Final    Comment:        The GeneXpert MRSA Assay (FDA approved for NASAL specimens only), is one component of a comprehensive MRSA colonization surveillance program. It is not intended to diagnose MRSA infection nor to guide or monitor treatment for MRSA infections.   Clostridium Difficile by PCR     Status: None   Collection Time: 10/26/14  8:40  AM  Result Value Ref Range Status   C difficile by pcr NEGATIVE NEGATIVE Final     Studies: Dg Abd 1 View  10/29/2014   CLINICAL DATA:  Abdominal pain.  Ileus.  EXAM: ABDOMEN - 1 VIEW  COMPARISON:  10/28/2014  FINDINGS: Last diffuse gaseous distention of the colon is again demonstrated. Cecum measures approximately 10 cm in diameter which is slightly decreased since previous study. No definite small bowel dilatation seen. Pelvic  surgical clips again seen from prior prostatectomy and iliac lymph node dissection.  IMPRESSION: Diffuse colonic distention shows mild improvement since previous study.   Electronically Signed   By: Earle Gell M.D.   On: 10/29/2014 08:32   Dg Abd 1 View  10/28/2014   CLINICAL DATA:  Abdominal distention, followup ileus  EXAM: ABDOMEN - 1 VIEW  COMPARISON:  10/27/2014  FINDINGS: Scattered large and small bowel gas is noted. Distension of the colon is again seen. The mid transverse colon now measures 9.6 cm which is a slight increase when compared with the prior exam. The cecum measures approximately 11.7 cm which is also increased from the prior exam. No free air is seen. No other focal abnormality is noted.  IMPRESSION: Persistent and slightly worsened colonic ileus.   Electronically Signed   By: Inez Catalina M.D.   On: 10/28/2014 08:30   Dg Abd 1 View  10/27/2014   CLINICAL DATA:  Adynamic ileus.  Recent hip/ femur surgery.  EXAM: ABDOMEN - 1 VIEW  COMPARISON:  10/26/2014 and 10/25/2014 as well as 10/24/2014, CT 10/24/2014  FINDINGS: Exam demonstrates continued air-filled colonic dilatation proximal to the splenic flexure as the transverse colon measures 8.5 cm in diameter slightly worse compared to the recent prior exam. This degree of dilatation is slightly improved compared to 10/24/2014. No free peritoneal air. Surgical clips are present over the right upper quadrant and throughout the pelvis. There are a few air-filled small bowel loops in the right abdomen. Remainder of  the exam is unchanged.  IMPRESSION: Slight worsening air-filled dilatation of the colon proximal to the splenic flexure. Findings may be due to colonic ileus versus intermittent distal colonic obstruction.   Electronically Signed   By: Marin Olp M.D.   On: 10/27/2014 11:00    Scheduled Meds: . allopurinol  300 mg Oral q morning - 10a  . erythromycin  250 mg Intravenous 3 times per day  . fluconazole (DIFLUCAN) IV  100 mg Intravenous Q24H  . insulin glargine  20 Units Subcutaneous QHS  . metoCLOPramide (REGLAN) injection  10 mg Intravenous 4 times per day  . polyethylene glycol  17 g Oral QID  . potassium chloride  40 mEq Oral TID  . rosuvastatin  10 mg Oral QPM   Continuous Infusions: . dextrose 5 % 1,000 mL with potassium chloride 40 mEq infusion 50 mL/hr at 10/28/14 1741  . diltiazem (CARDIZEM) infusion 5 mg/hr (10/28/14 1620)  . heparin 1,250 Units/hr (10/29/14 0005)    Time Spent: 25 min   Charlynne Cousins  [facility] Pager [phone]. If 7PM-7AM, please contact night-coverage at [email or web address], password [facility] 10/29/2014, 9:25 AM  LOS: 10 days      This is an addendum to all my previous note on the diagnosis of Alzheimer Disease: It was mild cognitive impermanent.

## 2014-10-29 NOTE — Care Management (Signed)
Medicare Important Message given? Yes (If Response is "NO", the following Medicare IM given date fields will be blank) Date Medicare IM given: 10/29/14 Medicare IM given by: [name]

## 2014-10-29 NOTE — Progress Notes (Signed)
UR Completed. Jessamine Barcia, RN, BSN.  [phone] 

## 2014-10-30 ENCOUNTER — Inpatient Hospital Stay (HOSPITAL_COMMUNITY): Payer: Medicare Other

## 2014-10-30 LAB — CBC
HCT: 33.4 % — ABNORMAL LOW (ref 39.0–52.0)
Hemoglobin: 11.4 g/dL — ABNORMAL LOW (ref 13.0–17.0)
MCH: 31.7 pg (ref 26.0–34.0)
MCHC: 34.1 g/dL (ref 30.0–36.0)
MCV: 92.8 fL (ref 78.0–100.0)
PLATELETS: 324 10*3/uL (ref 150–400)
RBC: 3.6 MIL/uL — AB (ref 4.22–5.81)
RDW: 14.6 % (ref 11.5–15.5)
WBC: 13.3 10*3/uL — AB (ref 4.0–10.5)

## 2014-10-30 LAB — GLUCOSE, CAPILLARY
GLUCOSE-CAPILLARY: 137 mg/dL — AB (ref 65–99)
GLUCOSE-CAPILLARY: 163 mg/dL — AB (ref 65–99)
Glucose-Capillary: 128 mg/dL — ABNORMAL HIGH (ref 65–99)
Glucose-Capillary: 163 mg/dL — ABNORMAL HIGH (ref 65–99)
Glucose-Capillary: 180 mg/dL — ABNORMAL HIGH (ref 65–99)

## 2014-10-30 LAB — BASIC METABOLIC PANEL
ANION GAP: 8 (ref 5–15)
BUN: 13 mg/dL (ref 6–20)
CO2: 23 mmol/L (ref 22–32)
CREATININE: 1.34 mg/dL — AB (ref 0.61–1.24)
Calcium: 9 mg/dL (ref 8.9–10.3)
Chloride: 102 mmol/L (ref 101–111)
GFR, EST AFRICAN AMERICAN: 57 mL/min — AB (ref >60)
GFR, EST NON AFRICAN AMERICAN: 49 mL/min — AB (ref >60)
GLUCOSE: 132 mg/dL — AB (ref 65–99)
POTASSIUM: 5.1 mmol/L (ref 3.5–5.1)
SODIUM: 133 mmol/L — AB (ref 135–145)

## 2014-10-30 LAB — HEPARIN LEVEL (UNFRACTIONATED): HEPARIN UNFRACTIONATED: 0.4 [IU]/mL (ref 0.30–0.70)

## 2014-10-30 NOTE — Care Management Note (Signed)
Case Management Note  Patient Details  Name: Howard Charles MRN: [id number] Date of Birth: [birth year]  Subjective/Objective:                    Action/Plan:   Expected Discharge Date:                  Expected Discharge Plan:  Skilled Nursing Facility  In-House Referral:  Clinical Social Work  Discharge planning Services  CM Consult  Post Acute Care Choice:    Choice offered to:     DME Arranged:    DME Agency:     HH Arranged:    [place] Agency:     Status of Service:  In process, will continue to follow  Medicare Important Message Given:  Yes Date Medicare IM Given:  10/23/14 Medicare IM give by:  Marvetta Gibbons Date Additional Medicare IM Given:    Additional Medicare Important Message give by:     If discussed at [place] of Stay Meetings, dates discussed:  10/29/2014  Additional Comments:  Delrae Sawyers, RN 10/30/2014, 9:13 AM

## 2014-10-30 NOTE — Progress Notes (Addendum)
[facility] PROGRESS NOTE Interim History: 79 y.o. male  has a past medical history of Diabetes mellitus type 2 in obese; Diabetic neuropathy; Hypertension; GERD  Gout; Left knee DJD; Right knee DJD; PONV  Bilateral hearing loss; Chronic renal insufficiency, stage III (moderate); Dysrhythmia; and On supplemental oxygen therapy. Presented with fall while at the grocery store, Patient lost balance and fell hitting his HEAD and left hip he was not able to get up. EMS was called and he arrived to ER. Plain imaging showed Intertochanteric fracture of the left hip. Patient with hx of A.fib on Xarelto last dose this AM and coreg. On arrival to ER he was tachycardic with HR up to 130 with A.fib he was given a dose of diltiazem and improved with HR down to 80's currently converted to sinus rhythm with heart rate of 70. Develop Ileus, NG tube place, GI consult Colonoscopy done showed small areas of ischemia. CT abd and pelvis showed improved iLeus, lactic acid 1.0.  HPI/Subjective: - continues to feel distended, no nausea, no vomiting, no abdominal pain   Assessment/Plan: Ileus, postoperative: - Advance full liquid, had several BMs in the past 24 hours - Cont Reglan, continue IVF - Keep mag. > 2.0 and K >4.0.  Atrial fibrillation with RVR: - Cardiology was consulted he was started on IV diltiazem, now in sinus rhythm. When able to take orals can change to po diltiazem. Remain on full liquid diet today  - Cont IV hepatin IV heparin, probably can change to NOAC's once diet is advanced - hbg remaines stable  Left closed hip fracture: - Orthopedic surgery was consulted and into her trochanteric IM nailing performed on 10/18/2014. - Physical therapy was consulted which recommended skilled nursing facility - Cont physical therapy.  Intermittent nightly confusion - stable  Acute on Chronic kidney see stage III: - Most likely prerenal due to decreased oral intake. - Resume IV hydration not  able to keep up with needs, mild increase in Cr.  Prostate cancer  Diabetes mellitus type 2 in obese: - decrease lantus - BG well controlled.  Essential  Hypertension - Bp stable.  Code Status: Full Code Family Communication: d/w patient Disposition Plan: SNF 2-3 days   Consultants:  GI  Cardiology   Procedures:  abd x-ray  Colonoscopy 5.26.2016: Multiple short discrete areas of ischemia in the sigmoid descending and transverse with normal bowel in between an air and fluid suctioned as above and no other obvious findings in unprepped exam  CT abd and pelvis 5.26.2016: Decompressed stomach, small bowel and colon following NG tube  insertion and decompression colonoscopy.  Antibiotics:  None  Objective: Filed Vitals:   10/29/14 0419 10/29/14 1318 10/29/14 2119 10/30/14 0453  BP: 127/57 130/73 127/82 145/67  Pulse: 63 71 67 70  Temp: 98.2 F (36.8 C) 98.2 F (36.8 C) 98.7 F (37.1 C) 98.4 F (36.9 C)  TempSrc: Oral Oral Oral Oral  Resp: 18 18 17 18   Height:      Weight:      SpO2: 94% 94% 95% 93%    Intake/Output Summary (Last 24 hours) at 10/30/14 1211 Last data filed at 10/30/14 0730  Gross per 24 hour  Intake    720 ml  Output   2028 ml  Net  -1308 ml   Filed Weights   10/24/14 1442  Weight: 101.606 kg (224 lb)   Exam: General: Alert, awake, oriented x3, in no acute distress.  HEENT: No bruits, no goiter.  Heart:  Regular rate and rhythm. Lungs: Good air movement,clear Abdomen: Soft, no tenderness, still distended, no bowel sounds.  Neuro: Grossly intact, nonfocal.   Data Reviewed: Basic Metabolic Panel:  Recent Labs Lab 10/24/14 0448  10/26/14 0305 10/26/14 1025 10/27/14 0509 10/28/14 0427 10/29/14 0329 10/30/14 0345  NA 137  < > 139  --  134* 132* 133* 133*  K 3.6  < > 4.7  --  4.2 3.9 4.7 5.1  CL 106  < > 111  --  106 101 102 102  CO2 23  < > 20*  --  23 25 25 23   GLUCOSE 136*  < > 86  --  140* 108* 124* 132*  BUN 38*  < >  30*  --  26* 21* 18 13  CREATININE 1.64*  < > 1.32*  --  1.22 1.18 1.27* 1.34*  CALCIUM 8.7*  < > 8.7*  --  8.7* 8.7* 8.9 9.0  MG 1.9  --   --  2.0  --  1.6*  --   --   PHOS 2.2*  --   --   --   --   --   --   --   < > = values in this interval not displayed.  CBC:  Recent Labs Lab 10/24/14 1902  10/26/14 0305 10/27/14 0509 10/28/14 0427 10/29/14 0329 10/30/14 0345  WBC 19.1*  < > 19.1* 14.7* 13.2* 12.4* 13.3*  NEUTROABS 16.2*  --   --   --   --   --   --   HGB 11.9*  < > 11.3* 10.9* 10.9* 11.1* 11.4*  HCT 34.8*  < > 33.7* 32.6* 32.3* 32.8* 33.4*  MCV 92.3  < > 92.1 92.6 91.8 91.6 92.8  PLT 237  < > 282 280 287 308 324  < > = values in this interval not displayed.  CBG:  Recent Labs Lab 10/29/14 1611 10/29/14 2104 10/30/14 0452 10/30/14 0747 10/30/14 1145  GLUCAP 174* 135* 137* 128* 163*    Recent Results (from the past 240 hour(s))  Clostridium Difficile by PCR     Status: None   Collection Time: 10/26/14  8:40 AM  Result Value Ref Range Status   C difficile by pcr NEGATIVE NEGATIVE Final     Studies: Dg Abd 1 View  10/29/2014   CLINICAL DATA:  Abdominal pain.  Ileus.  EXAM: ABDOMEN - 1 VIEW  COMPARISON:  10/28/2014  FINDINGS: Last diffuse gaseous distention of the colon is again demonstrated. Cecum measures approximately 10 cm in diameter which is slightly decreased since previous study. No definite small bowel dilatation seen. Pelvic surgical clips again seen from prior prostatectomy and iliac lymph node dissection.  IMPRESSION: Diffuse colonic distention shows mild improvement since previous study.   Electronically Signed   By: Earle Gell M.D.   On: 10/29/2014 08:32   Dg Abd Portable 1v  10/30/2014   CLINICAL DATA:  Ileus  EXAM: PORTABLE ABDOMEN - 1 VIEW  COMPARISON:  10/29/2014  FINDINGS: Diffuse gaseous distension of the: Again noted. Transverse colon measures approximately 9 cm in diameter. C gum measures approximately 10 cm in diameter. Findings are similar to  prior study.  Prior cholecystectomy. Surgical clips in the pelvis. No visible free air.  IMPRESSION: Continued diffuse colonic distention, unchanged.   Electronically Signed   By: Rolm Baptise M.D.   On: 10/30/2014 10:59    Scheduled Meds: . allopurinol  300 mg Oral q morning - 10a  . erythromycin  250 mg Intravenous 3 times per day  . fluconazole (DIFLUCAN) IV  100 mg Intravenous Q24H  . insulin glargine  20 Units Subcutaneous QHS  . metoCLOPramide (REGLAN) injection  10 mg Intravenous 4 times per day  . polyethylene glycol  17 g Oral QID  . rosuvastatin  10 mg Oral QPM   Continuous Infusions: . sodium chloride 50 mL/hr at 10/29/14 1103  . diltiazem (CARDIZEM) infusion 10 mg/hr (10/30/14 0507)  . heparin 1,250 Units/hr (10/29/14 2225)    Time Spent: 25 min   Marzetta Board  [facility] Pager [phone]. If 7PM-7AM, please contact night-coverage at [email or web address], password [facility] 10/30/2014, 12:11 PM  LOS: 11 days

## 2014-10-30 NOTE — Progress Notes (Signed)
ANTICOAGULATION CONSULT NOTE - Follow Up Consult  Pharmacy Consult for heparin Indication: atrial fibrillation  No Known Allergies  Patient Measurements: Wt= 101.6kg Ht= 5' 10'' IBW= 73kg Heparin dosing wt: 94kg  Vital Signs: Temp: 98.4 F (36.9 C) (06/01 0453) Temp Source: Oral (06/01 0453) BP: 145/67 mmHg (06/01 0453) Pulse Rate: 70 (06/01 0453)  Labs:  Recent Labs  10/28/14 0427 10/29/14 0329 10/30/14 0345  HGB 10.9* 11.1* 11.4*  HCT 32.3* 32.8* 33.4*  PLT 287 308 324  HEPARINUNFRC 0.37 0.40 0.40  CREATININE 1.18 1.27* 1.34*    Estimated Creatinine Clearance: 55.1 mL/min (by C-G formula based on Cr of 1.34).   Medical History: Past Medical History  Diagnosis Date  . Diabetes mellitus type 2 in obese   . Diabetic neuropathy   . Hypertension   . GERD (gastroesophageal reflux disease)   . Gout   . Left knee DJD   . Right knee DJD   . Bilateral hearing loss     WEARS HEARING AIDS........RIGHT IS BETTER THEN LEFT  . Chronic renal insufficiency, stage III (moderate)     Creatinine 1.47  . Dysrhythmia     h/o A Fib  . On supplemental oxygen therapy     at night  . Ileus, postoperative 10/22/2014  . PONV (postoperative nausea and vomiting)     H/O ....BUT LAST FEW TIMES---NONE    Medications:  Scheduled:  . allopurinol  300 mg Oral q morning - 10a  . erythromycin  250 mg Intravenous 3 times per day  . fluconazole (DIFLUCAN) IV  100 mg Intravenous Q24H  . insulin glargine  20 Units Subcutaneous QHS  . metoCLOPramide (REGLAN) injection  10 mg Intravenous 4 times per day  . polyethylene glycol  17 g Oral QID  . rosuvastatin  10 mg Oral QPM   Infusions:  . sodium chloride 50 mL/hr at 10/29/14 1103  . diltiazem (CARDIZEM) infusion 10 mg/hr (10/30/14 0507)  . heparin 1,250 Units/hr (10/29/14 2225)    Assessment: 79 yo male with hx afib on xarelto PTA. He is s/p fall with hip fracture and repair on 5/22 and and in now noted with post-op ileus > NPO.  There is concern for absorption of po Xarelto so pharmacy consulted to dose heparin  Heparin level at goal on 1250 units/hr  Advancing diet and has had BMs in the last 24 hours.  Goal of Therapy:  HL 0.3-0.7 Monitor platelets by anticoagulation protocol: Yes   Plan:  Continue Heparin at 1250 units/hr.  Follow up toleration of diet and restart Xarelto  Daily Heparin level, CBC ?LOT of Fluconazole, today is day#6 - I can't find an indication for this ?Reduce frequency of Miralax if ileus resolves - currently 4xd.  Thank you for allowing pharmacy to be a part of this patients care team.  Rowe Robert Pharm.D., BCPS, AQ-Cardiology Clinical Pharmacist 10/30/2014 10:26 AM Pager: [phone] Phone: [phone]

## 2014-10-30 NOTE — Progress Notes (Signed)
CSW spoke with pt wife who stated that they have decided for the pt to go to [facility] for rehab.  CSW informed facility who are able to accept pt at time of DC.  CSW will continue to follow.  Domenica Reamer, [place] Social Worker [phone]

## 2014-10-30 NOTE — Progress Notes (Signed)
[facility] Progress Note  Subjective: Patient still complaining of abdominal distention not subjectively different than yesterday. He did have a fair amount of stool and passed gas last night.  Objective: Vital signs in last 24 hours: Temp:  [98.2 F (36.8 C)-98.7 F (37.1 C)] 98.4 F (36.9 C) (06/01 0453) Pulse Rate:  [67-71] 70 (06/01 0453) Resp:  [17-18] 18 (06/01 0453) BP: (127-145)/(67-82) 145/67 mmHg (06/01 0453) SpO2:  [93 %-95 %] 93 % (06/01 0453) Weight change:    PE: Abdomen somewhat firm, symmetrically distended with decreased bowel sounds  Lab Results: Results for orders placed or performed during the hospital encounter of 10/19/14 (from the past 24 hour(s))  Glucose, capillary     Status: Abnormal   Collection Time: 10/29/14 11:53 AM  Result Value Ref Range   Glucose-Capillary 207 (H) 65 - 99 mg/dL   Comment 1 Notify RN    Comment 2 Document in Chart   Glucose, capillary     Status: Abnormal   Collection Time: 10/29/14  4:11 PM  Result Value Ref Range   Glucose-Capillary 174 (H) 65 - 99 mg/dL   Comment 1 Notify RN    Comment 2 Document in Chart   Glucose, capillary     Status: Abnormal   Collection Time: 10/29/14  9:04 PM  Result Value Ref Range   Glucose-Capillary 135 (H) 65 - 99 mg/dL   Comment 1 Notify RN    Comment 2 Document in Chart   CBC     Status: Abnormal   Collection Time: 10/30/14  3:45 AM  Result Value Ref Range   WBC 13.3 (H) 4.0 - 10.5 K/uL   RBC 3.60 (L) 4.22 - 5.81 MIL/uL   Hemoglobin 11.4 (L) 13.0 - 17.0 g/dL   HCT 33.4 (L) 39.0 - 52.0 %   MCV 92.8 78.0 - 100.0 fL   MCH 31.7 26.0 - 34.0 pg   MCHC 34.1 30.0 - 36.0 g/dL   RDW 14.6 11.5 - 15.5 %   Platelets 324 150 - 400 K/uL  Heparin level (unfractionated)     Status: None   Collection Time: 10/30/14  3:45 AM  Result Value Ref Range   Heparin Unfractionated 0.40 0.30 - 0.70 IU/mL  Basic metabolic panel     Status: Abnormal   Collection Time: 10/30/14  3:45 AM  Result  Value Ref Range   Sodium 133 (L) 135 - 145 mmol/L   Potassium 5.1 3.5 - 5.1 mmol/L   Chloride 102 101 - 111 mmol/L   CO2 23 22 - 32 mmol/L   Glucose, Bld 132 (H) 65 - 99 mg/dL   BUN 13 6 - 20 mg/dL   Creatinine, Ser 1.34 (H) 0.61 - 1.24 mg/dL   Calcium 9.0 8.9 - 10.3 mg/dL   GFR calc non Af Amer 49 (L) >60 mL/min   GFR calc Af Amer 57 (L) >60 mL/min   Anion gap 8 5 - 15  Glucose, capillary     Status: Abnormal   Collection Time: 10/30/14  4:52 AM  Result Value Ref Range   Glucose-Capillary 137 (H) 65 - 99 mg/dL   Comment 1 Notify RN    Comment 2 Document in Chart   Glucose, capillary     Status: Abnormal   Collection Time: 10/30/14  7:47 AM  Result Value Ref Range   Glucose-Capillary 128 (H) 65 - 99 mg/dL   Comment 1 Notify RN    Comment 2 Document in Chart     Studies/Results:  Dg Abd 1 View  10/29/2014   CLINICAL DATA:  Abdominal pain.  Ileus.  EXAM: ABDOMEN - 1 VIEW  COMPARISON:  10/28/2014  FINDINGS: Last diffuse gaseous distention of the colon is again demonstrated. Cecum measures approximately 10 cm in diameter which is slightly decreased since previous study. No definite small bowel dilatation seen. Pelvic surgical clips again seen from prior prostatectomy and iliac lymph node dissection.  IMPRESSION: Diffuse colonic distention shows mild improvement since previous study.   Electronically Signed   By: Earle Gell M.D.   On: 10/29/2014 08:32      Assessment: Postoperative ileus related to hip surgery with slow improvement  Plan: Continue Mira lax 4 times a day as well as full liquid diet. Further improvement on today's KUB could probably increase to low residue diet.    Cherly Erno C 10/30/2014, 10:29 AM  Pager [phone] If no answer or after 5 PM call [phone]

## 2014-10-31 ENCOUNTER — Inpatient Hospital Stay (HOSPITAL_COMMUNITY): Payer: Medicare Other

## 2014-10-31 LAB — GLUCOSE, CAPILLARY
GLUCOSE-CAPILLARY: 230 mg/dL — AB (ref 65–99)
GLUCOSE-CAPILLARY: 193 mg/dL — AB (ref 65–99)
GLUCOSE-CAPILLARY: 221 mg/dL — AB (ref 65–99)
Glucose-Capillary: 155 mg/dL — ABNORMAL HIGH (ref 65–99)
Glucose-Capillary: 155 mg/dL — ABNORMAL HIGH (ref 65–99)
Glucose-Capillary: 171 mg/dL — ABNORMAL HIGH (ref 65–99)

## 2014-10-31 LAB — BASIC METABOLIC PANEL
Anion gap: 9 (ref 5–15)
BUN: 15 mg/dL (ref 6–20)
CALCIUM: 9.1 mg/dL (ref 8.9–10.3)
CHLORIDE: 101 mmol/L (ref 101–111)
CO2: 21 mmol/L — ABNORMAL LOW (ref 22–32)
CREATININE: 1.6 mg/dL — AB (ref 0.61–1.24)
GFR calc Af Amer: 46 mL/min — ABNORMAL LOW (ref >60)
GFR, EST NON AFRICAN AMERICAN: 40 mL/min — AB (ref >60)
GLUCOSE: 151 mg/dL — AB (ref 65–99)
POTASSIUM: 4.9 mmol/L (ref 3.5–5.1)
Sodium: 131 mmol/L — ABNORMAL LOW (ref 135–145)

## 2014-10-31 LAB — CBC
HEMATOCRIT: 33.9 % — AB (ref 39.0–52.0)
Hemoglobin: 11.4 g/dL — ABNORMAL LOW (ref 13.0–17.0)
MCH: 31.8 pg (ref 26.0–34.0)
MCHC: 33.6 g/dL (ref 30.0–36.0)
MCV: 94.4 fL (ref 78.0–100.0)
PLATELETS: 380 10*3/uL (ref 150–400)
RBC: 3.59 MIL/uL — AB (ref 4.22–5.81)
RDW: 15.3 % (ref 11.5–15.5)
WBC: 18.5 10*3/uL — AB (ref 4.0–10.5)

## 2014-10-31 LAB — HEPARIN LEVEL (UNFRACTIONATED): HEPARIN UNFRACTIONATED: 0.31 [IU]/mL (ref 0.30–0.70)

## 2014-10-31 NOTE — Progress Notes (Addendum)
[facility] PROGRESS NOTE Interim History: 79 y.o. male  has a past medical history of Diabetes mellitus type 2 in obese; Diabetic neuropathy; Hypertension; GERD  Gout; Left knee DJD; Right knee DJD; PONV  Bilateral hearing loss; Chronic renal insufficiency, stage III (moderate); Dysrhythmia; and On supplemental oxygen therapy. Presented with fall while at the grocery store, Patient lost balance and fell hitting his HEAD and left hip he was not able to get up. EMS was called and he arrived to ER. Plain imaging showed Intertochanteric fracture of the left hip. Patient with hx of A.fib on Xarelto last dose this AM and coreg. On arrival to ER he was tachycardic with HR up to 130 with A.fib he was given a dose of diltiazem and improved with HR down to 80's currently converted to sinus rhythm with heart rate of 70. Develop Ileus, NG tube place, GI consult Colonoscopy done showed small areas of ischemia. CT abd and pelvis showed improved iLeus, lactic acid 1.0.  HPI/Subjective: - worsening distention, vomiting this morning - no chest pain/palpitations - wife concerned about his sleepiness after pain medications  Assessment/Plan:  Ileus, postoperative: - not improving, GI considering decompression, appreciate consult - mobilize with PT - minimize pain medications, d/c narcotics.  - Cont Reglan, continue IVF - Keep mag. > 2.0 and K >4.0.  Atrial fibrillation with RVR: - Cardiology was consulted he was started on IV diltiazem, now in sinus rhythm. When able to take orals can change to po diltiazem. Remain on full liquid diet today  - Cont IV hepatin IV heparin, probably can change to NOAC's once diet is advanced, but now his ileus is not improving  Left closed hip fracture: - Orthopedic surgery was consulted and into her trochanteric IM nailing performed on 10/18/2014. - Physical therapy was consulted which recommended skilled nursing facility - Cont physical therapy  Intermittent  nightly confusion - stable  Acute on Chronic kidney see stage III: - Most likely prerenal due to decreased oral intake. - Resume IV hydration not able to keep up with needs - Cr stable  Prostate cancer  Diabetes mellitus type 2 in obese: - decrease lantus - BG well controlled.  Essential  Hypertension - Bp stable.   Code Status: Full Code Family Communication: d/w patient and wife bedside Disposition Plan: SNF when ileus better  Consultants:  GI  Cardiology   Procedures:  abd x-ray  Colonoscopy 5.26.2016: Multiple short discrete areas of ischemia in the sigmoid descending and transverse with normal bowel in between an air and fluid suctioned as above and no other obvious findings in unprepped exam  CT abd and pelvis 5.26.2016: Decompressed stomach, small bowel and colon following NG tube  insertion and decompression colonoscopy.  Antibiotics:  None  Objective: Filed Vitals:   10/30/14 0453 10/30/14 1323 10/30/14 2026 10/31/14 0456  BP: 145/67 135/65 116/56 117/61  Pulse: 70 77 73 53  Temp: 98.4 F (36.9 C) 98.7 F (37.1 C) 97.9 F (36.6 C) 97.9 F (36.6 C)  TempSrc: Oral Oral Oral Oral  Resp: 18 19 18 18   Height:      Weight:      SpO2: 93% 96% 93% 92%    Intake/Output Summary (Last 24 hours) at 10/31/14 1233 Last data filed at 10/30/14 2226  Gross per 24 hour  Intake   2780 ml  Output    650 ml  Net   2130 ml   Filed Weights   10/24/14 1442  Weight: 101.606 kg (224  lb)   Exam: General: Alert, awake, oriented x3, in no acute distress.  HEENT: No bruits, no goiter.  Heart: Regular rate and rhythm. Lungs: Good air movement,clear Abdomen: Soft, no tenderness, still distended, no bowel sounds.  Neuro: Grossly intact, nonfocal. Skin: pale, no rashes  Data Reviewed: Basic Metabolic Panel:  Recent Labs Lab 10/26/14 1025 10/27/14 0509 10/28/14 0427 10/29/14 0329 10/30/14 0345 10/31/14 0448  NA  --  134* 132* 133* 133* 131*  K  --  4.2  3.9 4.7 5.1 4.9  CL  --  106 101 102 102 101  CO2  --  23 25 25 23  21*  GLUCOSE  --  140* 108* 124* 132* 151*  BUN  --  26* 21* 18 13 15   CREATININE  --  1.22 1.18 1.27* 1.34* 1.60*  CALCIUM  --  8.7* 8.7* 8.9 9.0 9.1  MG 2.0  --  1.6*  --   --   --     CBC:  Recent Labs Lab 10/24/14 1902  10/27/14 0509 10/28/14 0427 10/29/14 0329 10/30/14 0345 10/31/14 0448  WBC 19.1*  < > 14.7* 13.2* 12.4* 13.3* 18.5*  NEUTROABS 16.2*  --   --   --   --   --   --   HGB 11.9*  < > 10.9* 10.9* 11.1* 11.4* 11.4*  HCT 34.8*  < > 32.6* 32.3* 32.8* 33.4* 33.9*  MCV 92.3  < > 92.6 91.8 91.6 92.8 94.4  PLT 237  < > 280 287 308 324 380  < > = values in this interval not displayed.  CBG:  Recent Labs Lab 10/30/14 2022 10/31/14 0001 10/31/14 0453 10/31/14 0744 10/31/14 1116  GLUCAP 180* 155* 155* 171* 230*    Recent Results (from the past 240 hour(s))  Clostridium Difficile by PCR     Status: None   Collection Time: 10/26/14  8:40 AM  Result Value Ref Range Status   C difficile by pcr NEGATIVE NEGATIVE Final     Studies: Dg Abd Portable 1v  10/30/2014   CLINICAL DATA:  Ileus  EXAM: PORTABLE ABDOMEN - 1 VIEW  COMPARISON:  10/29/2014  FINDINGS: Diffuse gaseous distension of the: Again noted. Transverse colon measures approximately 9 cm in diameter. C gum measures approximately 10 cm in diameter. Findings are similar to prior study.  Prior cholecystectomy. Surgical clips in the pelvis. No visible free air.  IMPRESSION: Continued diffuse colonic distention, unchanged.   Electronically Signed   By: Rolm Baptise M.D.   On: 10/30/2014 10:59    Scheduled Meds: . allopurinol  300 mg Oral q morning - 10a  . erythromycin  250 mg Intravenous 3 times per day  . fluconazole (DIFLUCAN) IV  100 mg Intravenous Q24H  . insulin glargine  20 Units Subcutaneous QHS  . metoCLOPramide (REGLAN) injection  10 mg Intravenous 4 times per day  . polyethylene glycol  17 g Oral QID  . rosuvastatin  10 mg Oral QPM     Continuous Infusions: . sodium chloride 50 mL/hr at 10/31/14 1034  . diltiazem (CARDIZEM) infusion 10 mg/hr (10/31/14 0736)  . heparin 1,250 Units/hr (10/30/14 1922)    Time Spent: 25 min   Marzetta Board  [facility] Pager [phone]. If 7PM-7AM, please contact night-coverage at [email or web address], password [email or web address] 10/31/2014, 12:33 PM  LOS: 12 days

## 2014-10-31 NOTE — Progress Notes (Signed)
PATIENT CONTINUES TO HAVE NAUSEA AND VOMITING. VERY SMALL AMOUNT OF BROWN, STRINGY EMESIS. MEDICATED WITH PRN PHENERGAN.  CURRENTLY OUT BED TO CHAIR. STATES THAT HE IS COMFORTABLE FOR NOW AND DOES NOT WANT TO GET BACK IN BED.  CALL BELL AT SIDE.

## 2014-10-31 NOTE — Progress Notes (Signed)
Occupational Therapy Treatment Patient Details Name: Howard Charles MRN: [id number] DOB: 1935/08/26 Today's Date: 10/31/2014    History of present illness Pt is a 79 y/o M s/p fall and L hip fx.  Pt's PMH includes DM, diabetic neuropathy, gout, HTN, B TKA, B hearing loss, chronic renal insufficiency, on supplemental O2 at night, a fib, rotator cuff arthroscopy.   OT comments  Pt is limited by pain and making slow progress. He required the use of the Clarise Cruz Plus to perform a Total A transfer from bed to recliner and was unable to stand without lift equipment. Pt continues to require assist for ADLs and will benefit from Acute OT per POC.    Follow Up Recommendations  SNF;Supervision/Assistance - 24 hour    Equipment Recommendations  None recommended by OT    Recommendations for Other Services      Precautions / Restrictions Precautions Precautions: Fall Restrictions Weight Bearing Restrictions: Yes LLE Weight Bearing: Weight bearing as tolerated       Mobility Bed Mobility Overal bed mobility: Needs Assistance Bed Mobility: Rolling;Sidelying to Sit Rolling: Mod assist Sidelying to sit: Mod assist       General bed mobility comments: Pt required assist to progress LEs to EOB and use of bed pad to rotate hips. He was able to use UEs to assist with rolling and required Mod A to elevate trunk.   Transfers Overall transfer level: Needs assistance Equipment used: Ambulation equipment used Transfers: Sit to/from [facility] Sit to Stand: Total assist;+2 physical assistance Stand pivot transfers: Total assist;+2 physical assistance       General transfer comment: Pt unable to stand with total A +2 assist and required use of Sara Plus to complete transfer.    Balance Overall balance assessment: Needs assistance Sitting-balance support: Feet supported;Bilateral upper extremity supported Sitting balance-Leahy Scale: Poor                              ADL Overall ADL's : Needs assistance/impaired             Lower Body Bathing: Total assistance;+2 for physical assistance;Sit to/from stand       Lower Body Dressing: Maximal assistance;+2 for physical assistance;Sit to/from stand   Toilet Transfer: Total assistance;+2 for physical assistance;RW Toilet Transfer Details (indicate cue type and reason): Pt unable to stand with total A and required use of Clarise Cruz Plus to stand for transfer.            General ADL Comments: Pt wife requesting pt to get OOB and to chair. Pt limited with mobility and required assist for bed mobility. He was unable to stand with +2 assist and required use of Clarise Cruz Plus to stand and transfer.                 Cognition   Behavior During Therapy: WFL for tasks assessed/performed Overall Cognitive Status: History of cognitive impairments - at baseline                                    Pertinent Vitals/ Pain       Pain Assessment: Faces Faces Pain Scale: Hurts even more Pain Location: LLE when touched/mobility Pain Descriptors / Indicators: Grimacing;Guarding Pain Intervention(s): Limited activity within patient's tolerance;Monitored during session;Repositioned         Frequency Min 2X/week  Progress Toward Goals  OT Goals(current goals can now be found in the care plan section)  Progress towards OT goals: Not progressing toward goals - comment     Plan Discharge plan remains appropriate       End of Session Equipment Utilized During Treatment: Other (comment) Clarise Cruz Plus)   Activity Tolerance Patient limited by pain   Patient Left in chair;with call bell/phone within reach;with family/visitor present;with nursing/sitter in room   Nurse Communication Mobility status;Need for lift equipment (use Clarise Cruz Plus)        Time: 4128-7867 OT Time Calculation (min): 28 min  Charges: OT General Charges $OT Visit: 1 Procedure OT Treatments $Self Care/Home Management :  8-22 mins $Therapeutic Activity: 8-22 mins  Juluis Rainier 10/31/2014, 5:52 PM  Secundino Ginger Lynetta Mare, OTR/L Occupational Therapist [phone] (pager)

## 2014-10-31 NOTE — Progress Notes (Signed)
Physical Therapy Treatment Patient Details Name: Howard Charles MRN: [id number] DOB: 01/03/1936 Today's Date: 10/31/2014    History of Present Illness Pt is a 79 y/o M s/p fall and L hip fx.  Pt's PMH includes DM, diabetic neuropathy, gout, HTN, B TKA, B hearing loss, chronic renal insufficiency, on supplemental O2 at night, a fib, rotator cuff arthroscopy.    PT Comments    Pt not able to progress towards physical therapy goals this session. Once sitting up on EOB, pt had significant vomiting episode. After he received anti-nausea medication attempted transfer bed>chair, however pt was not able to power-up to full stand or take steps. Attempted x3 times to achieve standing and was not successful. Pt began having liquid stool during sit>stand and pt was returned to the bed for peri-care and linen change.   Prior to session, discussed pt case with RN who states that pt and wife are not satisfied with the amount of therapy that the patient is currently receiving. This therapist has discussed with the patient and wife in previous sessions that PT will see him at a frequency of 3x/week. Discussed with the unit Nursing Director the set frequency of therapy, discharge plan of SNF, current level of mobility, and need for staff assist for OOB between therapy sessions. Recommended that if nursing staff cannot assist pt out of bed with the RW, then use of the Stedy or Clarise Cruz Plus would be appropriate. Offered nursing staff to observe therapy session for mobility education.  Also discussed with OT who states will attempt to alternate PT and OT to maximize the days of therapy pt is receiving.   During session, this therapist educated the pt and wife that PT will see him on a Monday, Wednesday, Friday schedule, with the expectation that pt will be getting OOB with nursing staff between therapy days and on weekend days. All questions were answered to pt and wife's satisfaction, and they appeared agreeable to this  plan.   Follow Up Recommendations  SNF;Supervision for mobility/OOB     Equipment Recommendations  Rolling walker with 5" wheels    Recommendations for Other Services       Precautions / Restrictions Precautions Precautions: Fall Restrictions Weight Bearing Restrictions: Yes LLE Weight Bearing: Weight bearing as tolerated    Mobility  Bed Mobility Overal bed mobility: Needs Assistance Bed Mobility: Rolling;Sidelying to Sit;Sit to Supine Rolling: Mod assist Sidelying to sit: Mod assist   Sit to supine: Total assist;+2 for physical assistance   General bed mobility comments: Pt was able to transition to EOB with mod assist for LE movement and trunk support as pt elevated to full sitting position. With transfer back to supine, pt required total assist and direct, specific cueing for sequencing and technique. Medication after vomiting episode may have had an effect on pt's participation.   Transfers Overall transfer level: Needs assistance Equipment used: Rolling walker (2 wheeled) Transfers: Sit to/from Stand Sit to Stand: Total assist;+2 physical assistance         General transfer comment: Attempted sit<>stand x3 at EOB and pt was not able to achieve full standing. Pt began having full liquid diarrhea and pt was returned to supine for peri-care.   Ambulation/Gait             General Gait Details: Pt was unable to physically take steps at this time.    Stairs            Wheelchair Mobility    Modified Rankin (Stroke  Patients Only)       Balance Overall balance assessment: Needs assistance;History of Falls Sitting-balance support: Feet supported;Bilateral upper extremity supported Sitting balance-Leahy Scale: Poor     Standing balance support: Bilateral upper extremity supported;During functional activity Standing balance-Leahy Scale: Zero                      Cognition Arousal/Alertness: Awake/alert Behavior During Therapy: WFL for  tasks assessed/performed Overall Cognitive Status: History of cognitive impairments - at baseline                      Exercises      General Comments General comments (skin integrity, edema, etc.): Pt had major vomiting episode once sitting on EOB. RN notified and gave pt anti-nausea medication. RN told this therapist that pt should not try to eat or drink anything until she touched base with the MD. Therapist informed pt and wife.       Pertinent Vitals/Pain Pain Assessment: Faces Faces Pain Scale: Hurts even more Pain Location: LLE with mobility Pain Descriptors / Indicators: Operative site guarding;Guarding;Grimacing Pain Intervention(s): Limited activity within patient's tolerance;Monitored during session;Repositioned    Home Living                      Prior Function            PT Goals (current goals can now be found in the care plan section) Acute Rehab PT Goals Patient Stated Goal: to get rid of some of this pain. PT Goal Formulation: With patient Time For Goal Achievement: 11/07/14 Potential to Achieve Goals: Good Progress towards PT goals: Progressing toward goals    Frequency  Min 3X/week    PT Plan Current plan remains appropriate    Co-evaluation             End of Session Equipment Utilized During Treatment: Gait belt Activity Tolerance: Patient limited by pain;Treatment limited secondary to medical complications (Comment) (vomiting) Patient left: in bed;with call bell/phone within reach;with family/visitor present     Time: 1537-9432 PT Time Calculation (min) (ACUTE ONLY): 47 min  Charges:  $Therapeutic Activity: 38-52 mins                    G Codes:      Rolinda Roan 11-01-2014, 1:59 PM   Rolinda Roan, PT, DPT Acute Rehabilitation Services Pager: [phone]

## 2014-10-31 NOTE — Progress Notes (Addendum)
Pt now requesting the GI procedure for bowel decompression. He states anything the GI doc needs he wants done. Will page gastroenterology.   Will continue to monitor.   Notified attending Dr. Kerrie Pleasure, Nickola Major

## 2014-10-31 NOTE — Progress Notes (Signed)
Pt vomited x1 dark brown bile looking vomit. Large amount. PRN zofran administered.   Will continue to monitor.   Earlie Lou

## 2014-10-31 NOTE — Progress Notes (Signed)
Wife insisted that patient sit up in the chair. RN with two techs and PT still on the floor were able to get patient up using the sara lift. Pt very anxious and exhibited signs of nausea during process. Pt did not help move at all. Pt appeared fearful of moving. Pt now sitting in chair resting comfortably. Pt belly still very distended and he states he is very uncomfortable from the gas.   Will continue to monitor.   Howard Charles

## 2014-10-31 NOTE — Progress Notes (Signed)
Spoke with Dr. Cruzita Lederer. He stated that he could not do anything this evening and the RN needed to contact GI. He ordered to keep him NPO at midnight in anticipation for GI procedure tomorrow.   Will continue to monitor.   Earlie Lou

## 2014-10-31 NOTE — Care Management Note (Signed)
Case Management Note  Patient Details  Name: CASMIR AUGUSTE MRN: [id number] Date of Birth: 12-27-1935  Subjective/Objective:                    Action/Plan:   Expected Discharge Date:                  Expected Discharge Plan:  Skilled Nursing Facility  In-House Referral:  Clinical Social Work  Discharge planning Services  CM Consult  Post Acute Care Choice:    Choice offered to:     DME Arranged:    DME Agency:     HH Arranged:    [place] Agency:     Status of Service:  In process, will continue to follow  Medicare Important Message Given:  Yes Date Medicare IM Given:  10/23/14 Medicare IM give by:  Marvetta Gibbons Date Additional Medicare IM Given:  10/31/14 Additional Medicare Important Message give by:  [name]  If discussed at Long Length of Stay Meetings, dates discussed:  10/31/14  Additional Comments:  [name], RN 10/31/2014, 3:47 PM

## 2014-10-31 NOTE — Progress Notes (Signed)
ANTICOAGULATION CONSULT NOTE - Follow Up Consult  Pharmacy Consult for heparin Indication: atrial fibrillation  No Known Allergies  Patient Measurements: Wt= 101.6kg Ht= 5' 10'' IBW= 73kg Heparin dosing wt: 94kg  Vital Signs: Temp: 97.9 F (36.6 C) (06/02 0456) Temp Source: Oral (06/02 0456) BP: 117/61 mmHg (06/02 0456) Pulse Rate: 53 (06/02 0456)  Labs:  Recent Labs  10/29/14 0329 10/30/14 0345 10/31/14 0448  HGB 11.1* 11.4* 11.4*  HCT 32.8* 33.4* 33.9*  PLT 308 324 380  HEPARINUNFRC 0.40 0.40 0.31  CREATININE 1.27* 1.34* 1.60*    Estimated Creatinine Clearance: 46.2 mL/min (by C-G formula based on Cr of 1.6).   Medical History: Past Medical History  Diagnosis Date  . Diabetes mellitus type 2 in obese   . Diabetic neuropathy   . Hypertension   . GERD (gastroesophageal reflux disease)   . Gout   . Left knee DJD   . Right knee DJD   . Bilateral hearing loss     WEARS HEARING AIDS........RIGHT IS BETTER THEN LEFT  . Chronic renal insufficiency, stage III (moderate)     Creatinine 1.47  . Dysrhythmia     h/o A Fib  . On supplemental oxygen therapy     at night  . Ileus, postoperative 10/22/2014  . PONV (postoperative nausea and vomiting)     H/O ....BUT LAST FEW TIMES---NONE    Medications:  Scheduled:  . allopurinol  300 mg Oral q morning - 10a  . erythromycin  250 mg Intravenous 3 times per day  . fluconazole (DIFLUCAN) IV  100 mg Intravenous Q24H  . insulin glargine  20 Units Subcutaneous QHS  . metoCLOPramide (REGLAN) injection  10 mg Intravenous 4 times per day  . polyethylene glycol  17 g Oral QID  . rosuvastatin  10 mg Oral QPM   Infusions:  . sodium chloride 50 mL/hr at 10/29/14 1103  . diltiazem (CARDIZEM) infusion 10 mg/hr (10/31/14 0736)  . heparin 1,250 Units/hr (10/30/14 1922)    Assessment: 79 yo male with hx afib on xarelto PTA. He is s/p fall with hip fracture and repair on 5/22 and and in now noted with post-op ileus > NPO.  There is concern for absorption of po Xarelto so pharmacy consulted to dose heparin  Heparin level at goal on 1250 units/hr, CBC stable, no bleeding noted  Goal of Therapy:  HL 0.3-0.7 Monitor platelets by anticoagulation protocol: Yes   Plan:  Continue Heparin at 1250 units/hr.  Follow up toleration of diet and restart Xarelto  Daily Heparin level, CBC ?LOT of Fluconazole, today is day#7 - I can't find an indication for this ?Reduce frequency of Miralax if ileus resolves - currently 4xd.  Thank you for allowing pharmacy to be a part of this patients care team.  Rowe Robert Pharm.D., BCPS, AQ-Cardiology Clinical Pharmacist 10/31/2014 9:50 AM Pager: [phone] Phone: [phone]

## 2014-10-31 NOTE — Progress Notes (Addendum)
[facility] Progress Note  Subjective: Patient feels a little more distended today, tolerating diet no nausea no stools today but 2 or 3 yesterday  Objective: Vital signs in last 24 hours: Temp:  [97.9 F (36.6 C)-98.7 F (37.1 C)] 97.9 F (36.6 C) (06/02 0456) Pulse Rate:  [53-77] 53 (06/02 0456) Resp:  [18-19] 18 (06/02 0456) BP: (116-135)/(56-65) 117/61 mmHg (06/02 0456) SpO2:  [92 %-96 %] 92 % (06/02 0456) Weight change:    PE: Abdomen probably more distended today.  Lab Results: Results for orders placed or performed during the hospital encounter of 10/19/14 (from the past 24 hour(s))  Glucose, capillary     Status: Abnormal   Collection Time: 10/30/14 11:45 AM  Result Value Ref Range   Glucose-Capillary 163 (H) 65 - 99 mg/dL  Glucose, capillary     Status: Abnormal   Collection Time: 10/30/14  4:08 PM  Result Value Ref Range   Glucose-Capillary 163 (H) 65 - 99 mg/dL   Comment 1 Notify RN    Comment 2 Document in Chart   Glucose, capillary     Status: Abnormal   Collection Time: 10/30/14  8:22 PM  Result Value Ref Range   Glucose-Capillary 180 (H) 65 - 99 mg/dL   Comment 1 Notify RN    Comment 2 Document in Chart   Glucose, capillary     Status: Abnormal   Collection Time: 10/31/14 12:01 AM  Result Value Ref Range   Glucose-Capillary 155 (H) 65 - 99 mg/dL   Comment 1 Notify RN    Comment 2 Document in Chart   CBC     Status: Abnormal   Collection Time: 10/31/14  4:48 AM  Result Value Ref Range   WBC 18.5 (H) 4.0 - 10.5 K/uL   RBC 3.59 (L) 4.22 - 5.81 MIL/uL   Hemoglobin 11.4 (L) 13.0 - 17.0 g/dL   HCT 33.9 (L) 39.0 - 52.0 %   MCV 94.4 78.0 - 100.0 fL   MCH 31.8 26.0 - 34.0 pg   MCHC 33.6 30.0 - 36.0 g/dL   RDW 15.3 11.5 - 15.5 %   Platelets 380 150 - 400 K/uL  Heparin level (unfractionated)     Status: None   Collection Time: 10/31/14  4:48 AM  Result Value Ref Range   Heparin Unfractionated 0.31 0.30 - 0.70 IU/mL  Basic metabolic panel      Status: Abnormal   Collection Time: 10/31/14  4:48 AM  Result Value Ref Range   Sodium 131 (L) 135 - 145 mmol/L   Potassium 4.9 3.5 - 5.1 mmol/L   Chloride 101 101 - 111 mmol/L   CO2 21 (L) 22 - 32 mmol/L   Glucose, Bld 151 (H) 65 - 99 mg/dL   BUN 15 6 - 20 mg/dL   Creatinine, Ser 1.60 (H) 0.61 - 1.24 mg/dL   Calcium 9.1 8.9 - 10.3 mg/dL   GFR calc non Af Amer 40 (L) >60 mL/min   GFR calc Af Amer 46 (L) >60 mL/min   Anion gap 9 5 - 15  Glucose, capillary     Status: Abnormal   Collection Time: 10/31/14  4:53 AM  Result Value Ref Range   Glucose-Capillary 155 (H) 65 - 99 mg/dL   Comment 1 Notify RN    Comment 2 Document in Chart   Glucose, capillary     Status: Abnormal   Collection Time: 10/31/14  7:44 AM  Result Value Ref Range   Glucose-Capillary 171 (H) 65 -  99 mg/dL   Comment 1 Notify RN     Studies/Results: Dg Abd Portable 1v  10/30/2014   CLINICAL DATA:  Ileus  EXAM: PORTABLE ABDOMEN - 1 VIEW  COMPARISON:  10/29/2014  FINDINGS: Diffuse gaseous distension of the: Again noted. Transverse colon measures approximately 9 cm in diameter. C gum measures approximately 10 cm in diameter. Findings are similar to prior study.  Prior cholecystectomy. Surgical clips in the pelvis. No visible free air.  IMPRESSION: Continued diffuse colonic distention, unchanged.   Electronically Signed   By: Rolm Baptise M.D.   On: 10/30/2014 10:59      Assessment: Postoperative ileus status post endoscopic decompression with recurrence of colonic ileus  Plan: Discussed repeat decompressive colonoscopy versus rectal tube versus neostigmine. Patient states that he has had the most improvement one physical therapy walks him which they have not done so in the last 2 days. He would like to try that before any of the above measures Continue Mira lax, Reglan and he is also on erythromycin    Rhylan Gross C 10/31/2014, 9:06 AM  Pager [phone] If no answer or after 5 PM call [phone]

## 2014-11-01 ENCOUNTER — Inpatient Hospital Stay (HOSPITAL_COMMUNITY): Payer: Medicare Other

## 2014-11-01 ENCOUNTER — Encounter (HOSPITAL_COMMUNITY): Payer: Self-pay

## 2014-11-01 ENCOUNTER — Encounter (HOSPITAL_COMMUNITY)
Admission: EM | Disposition: A | Discharge status: Skilled Nursing Facility | Payer: Self-pay | Source: Home / Self Care | Attending: Internal Medicine

## 2014-11-01 HISTORY — PX: COLONOSCOPY: SHX5424

## 2014-11-01 LAB — CBC
HCT: 32 % — ABNORMAL LOW (ref 39.0–52.0)
Hemoglobin: 10.5 g/dL — ABNORMAL LOW (ref 13.0–17.0)
MCH: 30.7 pg (ref 26.0–34.0)
MCHC: 32.8 g/dL (ref 30.0–36.0)
MCV: 93.6 fL (ref 78.0–100.0)
Platelets: 427 10*3/uL — ABNORMAL HIGH (ref 150–400)
RBC: 3.42 MIL/uL — ABNORMAL LOW (ref 4.22–5.81)
RDW: 15.4 % (ref 11.5–15.5)
WBC: 21.1 10*3/uL — AB (ref 4.0–10.5)

## 2014-11-01 LAB — GLUCOSE, CAPILLARY
GLUCOSE-CAPILLARY: 179 mg/dL — AB (ref 65–99)
Glucose-Capillary: 186 mg/dL — ABNORMAL HIGH (ref 65–99)
Glucose-Capillary: 172 mg/dL — ABNORMAL HIGH (ref 65–99)
Glucose-Capillary: 156 mg/dL — ABNORMAL HIGH (ref 65–99)
Glucose-Capillary: 144 mg/dL — ABNORMAL HIGH (ref 65–99)
Glucose-Capillary: 138 mg/dL — ABNORMAL HIGH (ref 65–99)

## 2014-11-01 LAB — HEPARIN LEVEL (UNFRACTIONATED)
Heparin Unfractionated: 1.54 IU/mL — ABNORMAL HIGH (ref 0.30–0.70)
Heparin Unfractionated: 0.12 IU/mL — ABNORMAL LOW (ref 0.30–0.70)

## 2014-11-01 SURGERY — COLONOSCOPY
Anesthesia: Moderate Sedation

## 2014-11-01 MED ORDER — FENTANYL CITRATE (PF) 100 MCG/2ML IJ SOLN
INTRAMUSCULAR | Status: AC
  Filled 2014-11-01: qty 2

## 2014-11-01 MED ORDER — HEPARIN (PORCINE) IN NACL 100-0.45 UNIT/ML-% IJ SOLN
1450.0000 [IU]/h | INTRAMUSCULAR | Status: DC
  Administered 2014-11-01: 1200 [IU]/h via INTRAVENOUS
  Administered 2014-11-01: 1000 [IU]/h via INTRAVENOUS
  Administered 2014-11-02 – 2014-11-04 (×2): 1300 [IU]/h via INTRAVENOUS
  Administered 2014-11-04 – 2014-11-07 (×4): 1450 [IU]/h via INTRAVENOUS
  Filled 2014-11-01 (×10): qty 250

## 2014-11-01 MED ORDER — SODIUM CHLORIDE 0.9 % IV SOLN: INTRAVENOUS | Status: DC

## 2014-11-01 MED ORDER — DIPHENHYDRAMINE HCL 50 MG/ML IJ SOLN
INTRAMUSCULAR | Status: AC
  Filled 2014-11-01: qty 1

## 2014-11-01 MED ORDER — MIDAZOLAM HCL 5 MG/ML IJ SOLN
INTRAMUSCULAR | Status: AC
  Filled 2014-11-01: qty 2

## 2014-11-01 NOTE — H&P (Signed)
  79 y/o male s/p hip surgery w/ h/o DM, A fib on heparin infusion w/ persistent colonic ileus for past 10 days, approx 9 days s/p decompressive partial colonoscopy now w/ recurr distension and emesis.  Abd films show no free air, but progressive colonic (and now also SB) distension.  Pt had emesis x 2 in xray.  We feel that the patient needs a repeat attempt at colonoscopic decompression, even though the majority of his hair is more proximal. The patient and his wife are in favor of this.  I reviewed the risks of the procedure with the patient, including the possibility of perforation.  We will attempt to do the procedure with no sedation if possible, and if we do sedation, avoiding opiates which might exacerbate the colonic ileus problem. We will try to keep the patient position to minimize risk of aspiration in the event that he vomits. We will be using CO2 insufflation for more rapid resorption.  At this time, the patient is not in acute distress but it is noted that his white count is more elevated. He is afebrile. He is not tachycardic or toxic in appearance. Bowel sounds are present. The abdomen is distended and firm, but nontender.  Impression: Progressive colonic ileus  Plan proceed to colonoscopic decompression as described above.  Cleotis Nipper, M.D. Pager [phone] If no answer or after 5 PM call [phone]

## 2014-11-01 NOTE — Progress Notes (Signed)
Pt back from ENDO to room; pt alert and verbally responsive in bed with spouse at bedside. IV intact and infusing; VSS; will closely monitor per orders and protocol. Francis Gaines Jan Walters RN.

## 2014-11-01 NOTE — Care Management Note (Signed)
Case Management Note  Patient Details  Name: BRYAN GOIN MRN: [id number] Date of Birth: 04-Nov-1935  Subjective/Objective:                    Action/Plan:   Expected Discharge Date:                  Expected Discharge Plan:  Skilled Nursing Facility  In-House Referral:  Clinical Social Work  Discharge planning Services  CM Consult  Post Acute Care Choice:    Choice offered to:     DME Arranged:    DME Agency:     HH Arranged:    [place] Agency:     Status of Service:  In process, will continue to follow  Medicare Important Message Given: Yes Medicare IM give by: [name] Date Given: 11/01/14  Date Additional Medicare IM Given:  10/31/14 Additional Medicare Important Message give by:  [name]  If discussed at Long Length of Stay Meetings, dates discussed:  10/31/14  Additional Comments:  [name], RN 11/01/2014, 11:06 AM

## 2014-11-01 NOTE — Progress Notes (Signed)
ANTICOAGULATION CONSULT NOTE - Follow Up Consult  Pharmacy Consult for heparin Indication: atrial fibrillation  No Known Allergies  Patient Measurements: Wt= 101.6kg Ht= 5' 10'' IBW= 73kg Heparin dosing wt: 94kg  Vital Signs: Temp: 99 F (37.2 C) (06/03 0400) Temp Source: Oral (06/03 0400) BP: 112/63 mmHg (06/03 0400) Pulse Rate: 79 (06/03 0400)  Labs:  Recent Labs  10/30/14 0345 10/31/14 0448 11/01/14 0628  HGB 11.4* 11.4* 10.5*  HCT 33.4* 33.9* 32.0*  PLT 324 380 427*  HEPARINUNFRC 0.40 0.31 1.54*  CREATININE 1.34* 1.60*  --     Estimated Creatinine Clearance: 46.2 mL/min (by C-G formula based on Cr of 1.6).   Medical History: Past Medical History  Diagnosis Date  . Diabetes mellitus type 2 in obese   . Diabetic neuropathy   . Hypertension   . GERD (gastroesophageal reflux disease)   . Gout   . Left knee DJD   . Right knee DJD   . Bilateral hearing loss     WEARS HEARING AIDS........RIGHT IS BETTER THEN LEFT  . Chronic renal insufficiency, stage III (moderate)     Creatinine 1.47  . Dysrhythmia     h/o A Fib  . On supplemental oxygen therapy     at night  . Ileus, postoperative 10/22/2014  . PONV (postoperative nausea and vomiting)     H/O ....BUT LAST FEW TIMES---NONE    Medications:  Scheduled:  . allopurinol  300 mg Oral q morning - 10a  . erythromycin  250 mg Intravenous 3 times per day  . insulin glargine  20 Units Subcutaneous QHS  . metoCLOPramide (REGLAN) injection  10 mg Intravenous 4 times per day  . polyethylene glycol  17 g Oral QID  . rosuvastatin  10 mg Oral QPM   Infusions:  . sodium chloride 50 mL/hr at 10/31/14 1034  . diltiazem (CARDIZEM) infusion 10 mg/hr (11/01/14 0033)  . heparin      Assessment: 79 yo male with hx afib on xarelto PTA. He is s/p fall with hip fracture and repair on 5/22 and and in now noted with post-op ileus > NPO. There is concern for absorption of po Xarelto so pharmacy consulted to dose  heparin  Heparin level above goal on 1250 units/hr, CBC stable, no bleeding noted, but clinically patient is worse this day.  Goal of Therapy:  HL 0.3-0.7 Monitor platelets by anticoagulation protocol: Yes   Plan:  Hold heparin for 1h and then resume heparin at 1000 units/hr Check heparin level 8h after ggt starts Daily Heparin level, CBC  Thank you for allowing pharmacy to be a part of this patients care team.  Rowe Robert Pharm.D., BCPS, AQ-Cardiology Clinical Pharmacist 11/01/2014 9:42 AM Pager: [phone] Phone: [phone]

## 2014-11-01 NOTE — Progress Notes (Signed)
ANTICOAGULATION CONSULT NOTE - Follow Up Consult  Pharmacy Consult for Heparin Indication: atrial fibrillation  No Known Allergies  Patient Measurements: Height: 5\' 10"  (177.8 cm) Weight: 224 lb (101.606 kg) IBW/kg (Calculated) : 73 Heparin Dosing Weight: 94 kg  Vital Signs: Temp: 98.3 F (36.8 C) (06/03 2109) Temp Source: Oral (06/03 2109) BP: 132/52 mmHg (06/03 2109) Pulse Rate: 85 (06/03 2109)  Labs:  Recent Labs  10/30/14 0345 10/31/14 0448 11/01/14 0628 11/01/14 2000  HGB 11.4* 11.4* 10.5*  --   HCT 33.4* 33.9* 32.0*  --   PLT 324 380 427*  --   HEPARINUNFRC 0.40 0.31 1.54* 0.12*  CREATININE 1.34* 1.60*  --   --     Estimated Creatinine Clearance: 46.2 mL/min (by C-G formula based on Cr of 1.6).   Medications:  Heparin @ 1000 units/hr (10 ml/hr)  Assessment: 79 yo male with hx afib on xarelto PTA. He is s/p fall with hip fracture and repair on 5/22 and and in now noted with post-op ileus > NPO. There is concern for absorption of po Xarelto so pharmacy consulted to dose heparin  Heparin level this evening is SUBtherapeutic after a rate decrease earlier today (HL 0.12 << 1.54, goal of 0.3-0.7) however the level was drawn ~2 hours early. It is also possible the level earlier today could have been erroneous since the patient had been stable on that rate for several days. No issues with the drip or bleeding noted per RN report. Will increase back up this evening.   Goal of Therapy:  Heparin level 0.3-0.7 units/ml Monitor platelets by anticoagulation protocol: Yes   Plan:  1. Increase heparin to 1200 units/hr (12 ml/hr) 2. Will continue to monitor for any signs/symptoms of bleeding and will follow up with heparin level in 8 hours   Alycia Rossetti, PharmD, BCPS Clinical Pharmacist Pager: [phone] 11/01/2014 10:09 PM

## 2014-11-01 NOTE — Progress Notes (Signed)
PT Cancellation Note  Patient Details Name: Howard Charles MRN: [id number] DOB: [birth year]   Cancelled Treatment:    Reason Eval/Treat Not Completed: Spoke with pt this morning, who states that he is continuing to have vomiting and is feeling worse than yesterday. Asks that PT hold at this time. Per RN, pt is about to go down to x-ray. Will check back as schedule allows for pt's ability to participate. If not able to work with PT today, will follow-up on Monday.    Rolinda Roan 11/01/2014, 10:07 AM   Rolinda Roan, PT, DPT Acute Rehabilitation Services Pager: [phone]

## 2014-11-01 NOTE — Progress Notes (Signed)
Patient just completed decompressive colonoscopy, which appeared to be quite successful.   I was able to reach the proximal ascending colon. The patient passed a large amount of flatus and I was able to decompress the dilated proximal segments of the colon.   I did not see any evidence of mucosal ischemia today, in contrast to 9 days ago on his prior exam when Dr. Watt Climes did note areas consistent with ischemia.  I am ordering an NG tube since he has had vomiting and there was some small bowel gas on today's x-rays, and I have also ordered a rectal tube and repeat labs tomorrow. His potassium is fine today but he might need supplementation by tomorrow.  The patient is on a diltiazem drip. Diltiazem does seem to have constipating effects which might be contributing to this patient's colonic atony. I don't know whether it is possible for him to do without that medication, in view of his atrial fibrillation.  Dr. Paulita Fujita is rounding on the patient for [place] this weekend.  Please call us if you want to discuss his case or if you have any questions or we can be of further assistance as we go along.  Cleotis Nipper, M.D. Pager [phone] If no answer or after 5 PM call [phone]

## 2014-11-01 NOTE — Progress Notes (Signed)
Subjective: Abdominal distention worsening. Having some nausea and vomiting.  Objective: Vital signs in last 24 hours: Temp:  [98.7 F (37.1 C)-99 F (37.2 C)] 99 F (37.2 C) (06/03 0400) Pulse Rate:  [79-84] 79 (06/03 0400) Resp:  [19-20] 20 (06/03 0400) BP: (112-123)/(63-68) 112/63 mmHg (06/03 0400) SpO2:  [92 %-93 %] 93 % (06/03 0400) Weight change:  Last BM Date: 10/20/14  PE: GEN:  Somewhat tachypneic-appearing. ABD:  Significant distention and tympany; scant high-pitched bowel sounds; no peritonitis  Lab Results: CBC    Component Value Date/Time   WBC 21.1* 11/01/2014 0628   RBC 3.42* 11/01/2014 0628   HGB 10.5* 11/01/2014 0628   HCT 32.0* 11/01/2014 0628   PLT 427* 11/01/2014 0628   MCV 93.6 11/01/2014 0628   MCH 30.7 11/01/2014 0628   MCHC 32.8 11/01/2014 0628   RDW 15.4 11/01/2014 0628   LYMPHSABS 1.6 10/24/2014 1902   MONOABS 1.3* 10/24/2014 1902   EOSABS 0.0 10/24/2014 1902   BASOSABS 0.0 10/24/2014 1902   CMP     Component Value Date/Time   NA 131* 10/31/2014 0448   K 4.9 10/31/2014 0448   CL 101 10/31/2014 0448   CO2 21* 10/31/2014 0448   GLUCOSE 151* 10/31/2014 0448   BUN 15 10/31/2014 0448   CREATININE 1.60* 10/31/2014 0448   CALCIUM 9.1 10/31/2014 0448   PROT 6.8 05/14/2013 1006   ALBUMIN 3.5 05/14/2013 1006   AST 30 05/14/2013 1006   ALT 33 05/14/2013 1006   ALKPHOS 105 05/14/2013 1006   BILITOT 0.7 05/14/2013 1006   GFRNONAA 40* 10/31/2014 0448   GFRAA 46* 10/31/2014 0448   Studies/Results: Dg Abd Portable 1v  10/31/2014   CLINICAL DATA:  Follow-up ileus  EXAM: PORTABLE ABDOMEN - 1 VIEW  COMPARISON:  None.  FINDINGS: Persistent gaseous colonic distension. Some gaseous distended small bowel loops.  IMPRESSION: Persistent colonic and small bowel gaseous distension. Findings suspicious for significant ileus   Electronically Signed   By: Lahoma Crocker M.D.   On: 10/31/2014 13:19   Dg Abd Portable 1v  10/30/2014   CLINICAL DATA:  Ileus  EXAM:  PORTABLE ABDOMEN - 1 VIEW  COMPARISON:  10/29/2014  FINDINGS: Diffuse gaseous distension of the: Again noted. Transverse colon measures approximately 9 cm in diameter. C gum measures approximately 10 cm in diameter. Findings are similar to prior study.  Prior cholecystectomy. Surgical clips in the pelvis. No visible free air.  IMPRESSION: Continued diffuse colonic distention, unchanged.   Electronically Signed   By: Rolm Baptise M.D.   On: 10/30/2014 10:59   Assessment:  1.  Post-operative ileus.  Persistent interval increase in abdominal distention complicated by some shortness-of-breath.  Worsening leukocytosis.    Plan:  1.  Decompressive colonoscopy today by Dr. Cristina Gong. 2.  Pending success from #1 above, may very likely have nursing place rectal foley post-procedurally. 3.  Case discussed in detail with patient's wife, CeCe, and Dr. Cristina Gong.   Landry Dyke 11/01/2014, 10:36 AM   Pager [phone] If no answer or after 5 PM call [phone]

## 2014-11-01 NOTE — Progress Notes (Addendum)
[facility] PROGRESS NOTE Interim History: 79 y.o. male  has a past medical history of Diabetes mellitus type 2 in obese; Diabetic neuropathy; Hypertension; GERD  Gout; Left knee DJD; Right knee DJD; PONV  Bilateral hearing loss; Chronic renal insufficiency, stage III (moderate); Dysrhythmia; and On supplemental oxygen therapy. Presented with fall while at the grocery store, Patient lost balance and fell hitting his HEAD and left hip he was not able to get up. EMS was called and he arrived to ER. Plain imaging showed Intertochanteric fracture of the left hip. Patient with hx of A.fib on Xarelto last dose this AM and coreg. On arrival to ER he was tachycardic with HR up to 130 with A.fib he was given a dose of diltiazem and improved with HR down to 80's currently converted to sinus rhythm with heart rate of 70. Develop Ileus, NG tube place, GI consult Colonoscopy done showed small areas of ischemia. CT abd and pelvis showed improved iLeus, lactic acid 1.0.  HPI/Subjective: - very uncomfortable this morning, vomiting  Assessment/Plan:  Ileus, postoperative: - worsening, to have decompression per GI today  - mobilize with PT - discontinued narcotics 6/2, pain controlled without  - Cont Reglan, continue IVF - Keep mag. > 2.0 and K >4.0.  Atrial fibrillation with RVR: - Cardiology was consulted he was started on IV diltiazem, now in sinus rhythm. When able to take orals can change to po diltiazem. Remain on full liquid diet today  - Cont IV hepatin IV heparin, probably can change to NOAC's once diet is advanced, but now his ileus is not improving  Left closed hip fracture: - Orthopedic surgery was consulted and into her trochanteric IM nailing performed on 10/18/2014. - Physical therapy was consulted which recommended skilled nursing facility - Cont physical therapy  Intermittent nightly confusion - stable  Acute on Chronic kidney see stage III: - Most likely prerenal due to  decreased oral intake. - Resume IV hydration not able to keep up with needs - Cr stable  Prostate cancer  Diabetes mellitus type 2 in obese: - decrease lantus - BG well controlled.  Essential  Hypertension - Bp stable.   Code Status: Full Code Family Communication: d/w patient and wife bedside Disposition Plan: SNF when ileus better  Consultants:  GI  Cardiology   Procedures:  abd x-ray  Colonoscopy 5.26.2016: Multiple short discrete areas of ischemia in the sigmoid descending and transverse with normal bowel in between an air and fluid suctioned as above and no other obvious findings in unprepped exam  CT abd and pelvis 5.26.2016: Decompressed stomach, small bowel and colon following NG tube  insertion and decompression colonoscopy.  Antibiotics:  None  Objective: Filed Vitals:   10/31/14 0456 10/31/14 1430 10/31/14 1958 11/01/14 0400  BP: 117/61 118/68 123/65 112/63  Pulse: 53 84 80 79  Temp: 97.9 F (36.6 C) 98.7 F (37.1 C) 98.7 F (37.1 C) 99 F (37.2 C)  TempSrc: Oral Oral Oral Oral  Resp: 18 20 19 20   Height:      Weight:      SpO2: 92% 93% 92% 93%    Intake/Output Summary (Last 24 hours) at 11/01/14 0728 Last data filed at 11/01/14 4008  Gross per 24 hour  Intake 3395.83 ml  Output    903 ml  Net 2492.83 ml   Filed Weights   10/24/14 1442  Weight: 101.606 kg (224 lb)   Exam: General: uncomfortable, vomiting, oriented x3 HEENT: No bruits, no goiter.  Heart: Regular rate and rhythm. Lungs: Good air movement,clear Abdomen: Soft, no tenderness, still distended, no bowel sounds.  Neuro: Grossly intact, nonfocal. Skin: pale, no rashes  Data Reviewed: Basic Metabolic Panel:  Recent Labs Lab 10/26/14 1025 10/27/14 0509 10/28/14 0427 10/29/14 0329 10/30/14 0345 10/31/14 0448  NA  --  134* 132* 133* 133* 131*  K  --  4.2 3.9 4.7 5.1 4.9  CL  --  106 101 102 102 101  CO2  --  23 25 25 23  21*  GLUCOSE  --  140* 108* 124* 132* 151*    BUN  --  26* 21* 18 13 15   CREATININE  --  1.22 1.18 1.27* 1.34* 1.60*  CALCIUM  --  8.7* 8.7* 8.9 9.0 9.1  MG 2.0  --  1.6*  --   --   --     CBC:  Recent Labs Lab 10/28/14 0427 10/29/14 0329 10/30/14 0345 10/31/14 0448 11/01/14 0628  WBC 13.2* 12.4* 13.3* 18.5* 21.1*  HGB 10.9* 11.1* 11.4* 11.4* 10.5*  HCT 32.3* 32.8* 33.4* 33.9* 32.0*  MCV 91.8 91.6 92.8 94.4 93.6  PLT 287 308 324 380 427*    CBG:  Recent Labs Lab 10/31/14 1116 10/31/14 1613 10/31/14 2139 11/01/14 0301 11/01/14 0544  GLUCAP 230* 193* 221* 179* 186*    Recent Results (from the past 240 hour(s))  Clostridium Difficile by PCR     Status: None   Collection Time: 10/26/14  8:40 AM  Result Value Ref Range Status   C difficile by pcr NEGATIVE NEGATIVE Final     Studies: Dg Abd Portable 1v  10/31/2014   CLINICAL DATA:  Follow-up ileus  EXAM: PORTABLE ABDOMEN - 1 VIEW  COMPARISON:  None.  FINDINGS: Persistent gaseous colonic distension. Some gaseous distended small bowel loops.  IMPRESSION: Persistent colonic and small bowel gaseous distension. Findings suspicious for significant ileus   Electronically Signed   By: Lahoma Crocker M.D.   On: 10/31/2014 13:19   Dg Abd Portable 1v  10/30/2014   CLINICAL DATA:  Ileus  EXAM: PORTABLE ABDOMEN - 1 VIEW  COMPARISON:  10/29/2014  FINDINGS: Diffuse gaseous distension of the: Again noted. Transverse colon measures approximately 9 cm in diameter. C gum measures approximately 10 cm in diameter. Findings are similar to prior study.  Prior cholecystectomy. Surgical clips in the pelvis. No visible free air.  IMPRESSION: Continued diffuse colonic distention, unchanged.   Electronically Signed   By: Rolm Baptise M.D.   On: 10/30/2014 10:59    Scheduled Meds: . allopurinol  300 mg Oral q morning - 10a  . erythromycin  250 mg Intravenous 3 times per day  . fluconazole (DIFLUCAN) IV  100 mg Intravenous Q24H  . insulin glargine  20 Units Subcutaneous QHS  . metoCLOPramide  (REGLAN) injection  10 mg Intravenous 4 times per day  . polyethylene glycol  17 g Oral QID  . rosuvastatin  10 mg Oral QPM   Continuous Infusions: . sodium chloride 50 mL/hr at 10/31/14 1034  . diltiazem (CARDIZEM) infusion 10 mg/hr (11/01/14 0033)  . heparin 1,250 Units/hr (10/31/14 1546)    Marzetta Board  [facility] Pager [phone]. If 7PM-7AM, please contact night-coverage at [email or web address], password [facility] 11/01/2014, 7:28 AM  LOS: 13 days

## 2014-11-01 NOTE — Progress Notes (Signed)
Noted that patient's blood sugars have been less than 180 mg/dl. Recommend adding Novolog SENSITIVE correction scale TID & HS when eating and while in the hospital.  Current Lantus order for 20 units at HS may need to be adjusted during hospital stay. Patient takes Lantus 52 units daily at home. Will continue to follow while in hospital. Harvel Ricks RN BSN CDE

## 2014-11-01 NOTE — Progress Notes (Signed)
NG tube inserted to pt right nare set to intermittent low suctioning; rectal tube inserted and to gravity drainage. Pt in bed comfortably with spouse at bedside and call light within reach. Will closely monitor pt. Francis Gaines Lanaya Bennis RN.

## 2014-11-01 NOTE — Op Note (Signed)
[facility] [place], [address]   COLONOSCOPY PROCEDURE REPORT  PATIENT: Howard Charles, Howard Charles  MR#: [id number] BIRTHDATE: 1935/10/23 , 79  yrs. old GENDER: male ENDOSCOPIST: Ronald Lobo, MD REFERRED BY:  Dr. Deland Pretty PROCEDURE DATE:  Nov 15, 2014 PROCEDURE:   Colonoscopy (for decompression) ASA CLASS:   III INDICATIONS: persistent colonic atony with distention, progressive elevation of white count, colonic mucosal ischemia noted on previous decompression 9 days ago MEDICATIONS: none (no sedation administered)  DESCRIPTION OF PROCEDURE:   After the risks and benefits and of the procedure were explained, informed consent was obtained. the patient was brought from x-ray, where it had been noted that he did not have free air but there was progression in his colonic distention, to the [facility] cone endoscopy unit. Timeout was performed. It was elected not to use sedation for this procedure, and to use the pediatric colonoscope to optimize comfort.  Digital exam showed what appeared to be a flat prostate bed and no masses, no anal stenosis. some liquid stool was present.  The Pentax Ped Colon [id number]  pediatric video colonoscope was introduced through the anus and advanced to the proximal descending colon, with the ileocecal valve visualized in the distance, but not reached.      .  Some external abdominal compression was used to help control looping and facilitate advancement.  The quality of the prep was actually quite good. There was a moderate amount of liquid stool that couldn't be suctioned out, but no solid stool was present.      .  The instrument was then slowly withdrawn as the colon was fully examined. Estimated blood loss is zero unless otherwise noted in this procedure report.   In the left colon, there was a small patch of erythema without really any significant granularity or exudate; this could represent resolving ischemic  colitis.  There also appeared to be mucosal edema in the rectosigmoid region, characterized by a sort of cobblestone appearance to the mucosa. There were no areas of frank inflammation, and I did not see any polyps, masses, diverticulosis, or vascular ectasia.  In the mid colon, I encountered segments of the colon that were clearly very dilated. These were successfully decompressed by suctioning, and at one point, the patient expelled a large amount of flatus.  During the course of the procedure, he felt progressively more comfortable, and he tolerated the procedure well even despite the absence of sedation. Palpation indicated significantly softer abdomen at the conclusion of the procedure, and there was only mild tympany present, compared to prior to the procedure.  Retroflexion was not performed.  No biopsies were obtained.  The scope was then withdrawn from the patient and the procedure completed.  WITHDRAWAL TIME: not relevant for a therapeutic procedure like this  COMPLICATIONS: There were no immediate complications.   ENDOSCOPIC IMPRESSION: 1. Successful decompression of the colon 2. No evidence of mucosal ischemia or inflammation, other than some minimal erythema possibly representative of resolving ischemia  RECOMMENDATIONS: 1. Check x-ray now and again tomorrow morning. 2. Rectal tube 3. NG tube in view of recurrent vomiting.  REPEAT EXAM:  cc:  _______________________________ eSignedRonald Lobo, MD 11/15/2014 11:50 AM   CPT CODES: ICD CODES:  The ICD and CPT codes recommended by this software are interpretations from the data that the clinical staff has captured with the software.  The verification of the translation of this report to the ICD and CPT codes and modifiers is the  sole responsibility of the health care institution and practicing physician where this report was generated.  [employer] will not be held responsible  for the validity of the ICD and CPT codes included on this report.  AMA assumes no liability for data contained or not contained herein. CPT is a Designer, television/film set of the [employer].   PATIENT NAME:  Howard Charles, Howard Charles MR#: [id number]

## 2014-11-01 NOTE — Progress Notes (Signed)
Pt remains stable during shift; 341ml green/bile looking fluid output in NG tube and 691ml brown fluid with strong odor output from rectal tube. Pt sleeping comfortably in bed with call light within reach. Reported off to incoming RN. Francis Gaines Hristopher Missildine RN.

## 2014-11-02 LAB — GLUCOSE, CAPILLARY
GLUCOSE-CAPILLARY: 112 mg/dL — AB (ref 65–99)
GLUCOSE-CAPILLARY: 119 mg/dL — AB (ref 65–99)
GLUCOSE-CAPILLARY: 132 mg/dL — AB (ref 65–99)
GLUCOSE-CAPILLARY: 141 mg/dL — AB (ref 65–99)
Glucose-Capillary: 126 mg/dL — ABNORMAL HIGH (ref 65–99)
Glucose-Capillary: 117 mg/dL — ABNORMAL HIGH (ref 65–99)

## 2014-11-02 LAB — COMPREHENSIVE METABOLIC PANEL
ALK PHOS: 108 U/L (ref 38–126)
ALT: 53 U/L (ref 17–63)
AST: 29 U/L (ref 15–41)
Albumin: 2.3 g/dL — ABNORMAL LOW (ref 3.5–5.0)
Anion gap: 10 (ref 5–15)
BUN: 20 mg/dL (ref 6–20)
CO2: 23 mmol/L (ref 22–32)
Calcium: 8.8 mg/dL — ABNORMAL LOW (ref 8.9–10.3)
Chloride: 99 mmol/L — ABNORMAL LOW (ref 101–111)
Creatinine, Ser: 1.8 mg/dL — ABNORMAL HIGH (ref 0.61–1.24)
GFR, EST AFRICAN AMERICAN: 40 mL/min — AB (ref >60)
GFR, EST NON AFRICAN AMERICAN: 35 mL/min — AB (ref >60)
Glucose, Bld: 142 mg/dL — ABNORMAL HIGH (ref 65–99)
POTASSIUM: 3.9 mmol/L (ref 3.5–5.1)
Sodium: 132 mmol/L — ABNORMAL LOW (ref 135–145)
TOTAL PROTEIN: 5 g/dL — AB (ref 6.5–8.1)
Total Bilirubin: 1.1 mg/dL (ref 0.3–1.2)

## 2014-11-02 LAB — HEPARIN LEVEL (UNFRACTIONATED)
Heparin Unfractionated: 0.26 IU/mL — ABNORMAL LOW (ref 0.30–0.70)
Heparin Unfractionated: 0.53 IU/mL (ref 0.30–0.70)

## 2014-11-02 LAB — CBC
HCT: 29.4 % — ABNORMAL LOW (ref 39.0–52.0)
Hemoglobin: 9.9 g/dL — ABNORMAL LOW (ref 13.0–17.0)
MCH: 31.4 pg (ref 26.0–34.0)
MCHC: 33.7 g/dL (ref 30.0–36.0)
MCV: 93.3 fL (ref 78.0–100.0)
PLATELETS: 390 10*3/uL (ref 150–400)
RBC: 3.15 MIL/uL — ABNORMAL LOW (ref 4.22–5.81)
RDW: 15.3 % (ref 11.5–15.5)
WBC: 17.3 10*3/uL — ABNORMAL HIGH (ref 4.0–10.5)

## 2014-11-02 LAB — MAGNESIUM: MAGNESIUM: 1.7 mg/dL (ref 1.7–2.4)

## 2014-11-02 LAB — PHOSPHORUS: Phosphorus: 3.6 mg/dL (ref 2.5–4.6)

## 2014-11-02 NOTE — Progress Notes (Signed)
ANTICOAGULATION CONSULT NOTE - Follow Up Consult  Pharmacy Consult for heparin Indication: atrial fibrillation   Labs:  Recent Labs  10/31/14 0448 11/01/14 0628 11/01/14 2000 11/02/14 0347 11/02/14 0625  HGB 11.4* 10.5*  --  9.9*  --   HCT 33.9* 32.0*  --  29.4*  --   PLT 380 427*  --  390  --   HEPARINUNFRC 0.31 1.54* 0.12*  --  0.26*  CREATININE 1.60*  --   --  1.80*  --      Assessment: 79yo male remains subtherapeutic on heparin after rate increase, close to goal.  Goal of Therapy:  Heparin level 0.3-0.7 units/ml   Plan:  Will increase heparin gtt by 1 unit/kg/hr to 1300 units/hr and check level in [place], PharmD, BCPS  11/02/2014,7:37 AM

## 2014-11-02 NOTE — Progress Notes (Signed)
Subjective: Less distended. Feels much better after colonoscopic decompression and nasogastric tube placement.  Objective: Vital signs in last 24 hours: Temp:  [98.2 F (36.8 C)-98.9 F (37.2 C)] 98.2 F (36.8 C) (06/04 0400) Pulse Rate:  [67-85] 67 (06/04 0400) Resp:  [18-21] 18 (06/04 0400) BP: (105-132)/(49-55) 105/49 mmHg (06/04 0400) SpO2:  [90 %-93 %] 90 % (06/04 0400) Weight change:  Last BM Date: 11/02/14  PE: GEN:  NGT tube in place, draining dark bilious effluent.  Deconditioned-appearing.  Not acutely toxic-appearing ABD:  Distended, but improved compared to yesterday, scant bowel sounds  Lab Results: CBC    Component Value Date/Time   WBC 17.3* 11/02/2014 0347   RBC 3.15* 11/02/2014 0347   HGB 9.9* 11/02/2014 0347   HCT 29.4* 11/02/2014 0347   PLT 390 11/02/2014 0347   MCV 93.3 11/02/2014 0347   MCH 31.4 11/02/2014 0347   MCHC 33.7 11/02/2014 0347   RDW 15.3 11/02/2014 0347   LYMPHSABS 1.6 10/24/2014 1902   MONOABS 1.3* 10/24/2014 1902   EOSABS 0.0 10/24/2014 1902   BASOSABS 0.0 10/24/2014 1902   CMP     Component Value Date/Time   NA 132* 11/02/2014 0347   K 3.9 11/02/2014 0347   CL 99* 11/02/2014 0347   CO2 23 11/02/2014 0347   GLUCOSE 142* 11/02/2014 0347   BUN 20 11/02/2014 0347   CREATININE 1.80* 11/02/2014 0347   CALCIUM 8.8* 11/02/2014 0347   PROT 5.0* 11/02/2014 0347   ALBUMIN 2.3* 11/02/2014 0347   AST 29 11/02/2014 0347   ALT 53 11/02/2014 0347   ALKPHOS 108 11/02/2014 0347   BILITOT 1.1 11/02/2014 0347   GFRNONAA 35* 11/02/2014 0347   GFRAA 40* 11/02/2014 0347   Assessment:  1.  Small and large bowel ileus, s/p hip surgery.  Improved after nasogastric tube placement and colonoscopic decompression yesterday. 2.  Abdominal distention, due to #1 above. 3.  Feeding difficulties. 4.  Deconditioning.  Plan:  1.  Continue nasogastric tube.  Continue rectal foley tube as well, which was placed after colonoscopy. 2.  Increase  ambulation, mobilize, as his deconditioning, hip surgery status, and overall medical condition allows. 3.  Watch potassium.  Minimize use of narcotics. 4.  Repeat abdominal xray tomorrow morning; if improved, or at least not worsening, might consider clamping NGT tomorrow and try sips of clear liquids. 5.  Case discussed with patient and his wife. 6.  Will follow.   Landry Dyke 11/02/2014, 12:45 PM   Pager [phone] If no answer or after 5 PM call [phone]

## 2014-11-02 NOTE — Progress Notes (Signed)
ANTICOAGULATION CONSULT NOTE - Follow Up Consult  Pharmacy Consult for heparin Indication: atrial fibrillation  Labs:  Recent Labs  10/31/14 0448 11/01/14 0628 11/01/14 2000 11/02/14 0347 11/02/14 0625 11/02/14 1635  HGB 11.4* 10.5*  --  9.9*  --   --   HCT 33.9* 32.0*  --  29.4*  --   --   PLT 380 427*  --  390  --   --   HEPARINUNFRC 0.31 1.54* 0.12*  --  0.26* 0.53  CREATININE 1.60*  --   --  1.80*  --   --    Assessment: 79yo male on Xarelto prior to admit for Afib now on IV heparin at 1300 units/hr.  He is therapeutic with HL at 0.53 and no noted bleeding complications.  Goal of Therapy:  Heparin level 0.3-0.7 units/ml   Plan:  Will continue heparin gtt at 1300 units/hr and check in AM. CBC in AM  Rober Minion, PharmD., MS Clinical Pharmacist Pager:  [phone] Thank you for allowing pharmacy to be part of this patients care team. 11/02/2014,5:35 PM

## 2014-11-02 NOTE — Progress Notes (Addendum)
[facility] PROGRESS NOTE Interim History: 79 y.o. male  has a past medical history of Diabetes mellitus type 2 in obese; Diabetic neuropathy; Hypertension; GERD  Gout; Left knee DJD; Right knee DJD; PONV  Bilateral hearing loss; Chronic renal insufficiency, stage III (moderate); Dysrhythmia; and On supplemental oxygen therapy. Presented with fall while at the grocery store, Patient lost balance and fell hitting his HEAD and left hip he was not able to get up. EMS was called and he arrived to ER. Plain imaging showed Intertochanteric fracture of the left hip. Patient with hx of A.fib on Xarelto last dose this AM and coreg. On arrival to ER he was tachycardic with HR up to 130 with A.fib he was given a dose of diltiazem and improved with HR down to 80's currently converted to sinus rhythm with heart rate of 70. Develop Ileus, NG tube place, GI consult Colonoscopy done showed small areas of ischemia. CT abd and pelvis showed improved iLeus, lactic acid 1.0.  HPI/Subjective: - he is feeling a lot better today, NG and rectal tube in place  Assessment/Plan:  Ileus, postoperative: - worsening, s/p decompression 6/3 - mobilize with PT - discontinued narcotics 6/2, pain controlled without  - Cont Reglan, continue IVF - Keep mag. > 2.0 and K >4.0. - 600-700 mL output via NG today alone  Atrial fibrillation with RVR: - Cardiology was consulted he was started on IV diltiazem, now in sinus rhythm. When able to take orals can change to po diltiazem. Remain on full liquid diet today  - Cont IV hepatin IV heparin, probably can change to NOAC's once diet is advanced, but now his ileus is not improving  Left closed hip fracture: - Orthopedic surgery was consulted and into her trochanteric IM nailing performed on 10/18/2014. - Physical therapy was consulted which recommended skilled nursing facility - Cont physical therapy  Intermittent nightly confusion - stable  Acute on Chronic kidney  see stage III: - Most likely prerenal due to decreased oral intake. - Resume IV hydration not able to keep up with needs - Cr stable  Prostate cancer  Diabetes mellitus type 2 in obese: - decrease lantus - BG well controlled.  Essential  Hypertension - Bp stable.   Code Status: Full Code Family Communication: d/w patient and wife bedside Disposition Plan: SNF when ileus better  Consultants:  GI  Cardiology   Procedures:  abd x-ray  Colonoscopy 5.26.2016: Multiple short discrete areas of ischemia in the sigmoid descending and transverse with normal bowel in between an air and fluid suctioned as above and no other obvious findings in unprepped exam  CT abd and pelvis 5.26.2016: Decompressed stomach, small bowel and colon following NG tube  insertion and decompression colonoscopy.  Antibiotics:  None  Objective: Filed Vitals:   11/01/14 1135 11/01/14 1400 11/01/14 2109 11/02/14 0400  BP: 146/93 116/55 132/52 105/49  Pulse: 91 75 85 67  Temp:  98.9 F (37.2 C) 98.3 F (36.8 C) 98.2 F (36.8 C)  TempSrc:  Oral Oral Oral  Resp: 23 20 21 18   Height:      Weight:      SpO2: 95% 93% 93% 90%    Intake/Output Summary (Last 24 hours) at 11/02/14 1023 Last data filed at 11/02/14 0846  Gross per 24 hour  Intake      0 ml  Output   2650 ml  Net  -2650 ml   Filed Weights   10/24/14 1442  Weight: 101.606 kg (224 lb)  Exam: General: uncomfortable, vomiting, oriented x3 HEENT: No bruits, no goiter.  Heart: Regular rate and rhythm. Lungs: Good air movement,clear Abdomen: Soft, no tenderness, still distended, no bowel sounds.  Neuro: Grossly intact, nonfocal. Skin: pale, no rashes  Data Reviewed: Basic Metabolic Panel:  Recent Labs Lab 10/26/14 1025  10/28/14 0427 10/29/14 0329 10/30/14 0345 10/31/14 0448 11/02/14 0347  NA  --   < > 132* 133* 133* 131* 132*  K  --   < > 3.9 4.7 5.1 4.9 3.9  CL  --   < > 101 102 102 101 99*  CO2  --   < > 25 25 23   21* 23  GLUCOSE  --   < > 108* 124* 132* 151* 142*  BUN  --   < > 21* 18 13 15 20   CREATININE  --   < > 1.18 1.27* 1.34* 1.60* 1.80*  CALCIUM  --   < > 8.7* 8.9 9.0 9.1 8.8*  MG 2.0  --  1.6*  --   --   --  1.7  PHOS  --   --   --   --   --   --  3.6  < > = values in this interval not displayed.  CBC:  Recent Labs Lab 10/29/14 0329 10/30/14 0345 10/31/14 0448 11/01/14 0628 11/02/14 0347  WBC 12.4* 13.3* 18.5* 21.1* 17.3*  HGB 11.1* 11.4* 11.4* 10.5* 9.9*  HCT 32.8* 33.4* 33.9* 32.0* 29.4*  MCV 91.6 92.8 94.4 93.6 93.3  PLT 308 324 380 427* 390    CBG:  Recent Labs Lab 11/01/14 1658 11/01/14 2106 11/02/14 0014 11/02/14 0438 11/02/14 0807  GLUCAP 144* 138* 141* 126* 112*    Recent Results (from the past 240 hour(s))  Clostridium Difficile by PCR     Status: None   Collection Time: 10/26/14  8:40 AM  Result Value Ref Range Status   C difficile by pcr NEGATIVE NEGATIVE Final     Studies: Dg Abd 1 View  11/01/2014   CLINICAL DATA:  Post decompression of the colon.  EXAM: ABDOMEN - 1 VIEW  COMPARISON:  11/01/2014  FINDINGS: Decreasing gaseous distention of the colon since prior study. Continued small bowel dilatation. Prior cholecystectomy. No free air organomegaly.  IMPRESSION: Decreasing gaseous distention of the colon with continued moderate gaseous distension of small bowel.   Electronically Signed   By: Rolm Baptise M.D.   On: 11/01/2014 12:38   Dg Abd 2 Views  11/01/2014   CLINICAL DATA:  Abdominal distention, vomiting, status post hip placement last week.  EXAM: ABDOMEN - 2 VIEW  COMPARISON:  10/31/2014  FINDINGS: There is diffuse gaseous distention of large and small bowel. Cecum is approximately 13-14 cm in diameter. This is larger than prior study. The cecum was not well visualized or measurable previously. Proximal transverse colon measures 11-12 cm in diameter, compared with 10 cm previously.  No free air. No organomegaly. Prior cholecystectomy. Surgical clips in  the pelvis. No acute bony abnormality.  IMPRESSION: Diffuse marked gaseous distention of large and small bowel, likely worsening since prior study. No free air.   Electronically Signed   By: Rolm Baptise M.D.   On: 11/01/2014 10:49   Dg Abd Portable 1v  10/31/2014   CLINICAL DATA:  Follow-up ileus  EXAM: PORTABLE ABDOMEN - 1 VIEW  COMPARISON:  None.  FINDINGS: Persistent gaseous colonic distension. Some gaseous distended small bowel loops.  IMPRESSION: Persistent colonic and small bowel gaseous distension. Findings suspicious  for significant ileus   Electronically Signed   By: Lahoma Crocker M.D.   On: 10/31/2014 13:19    Scheduled Meds: . allopurinol  300 mg Oral q morning - 10a  . erythromycin  250 mg Intravenous 3 times per day  . insulin glargine  20 Units Subcutaneous QHS  . metoCLOPramide (REGLAN) injection  10 mg Intravenous 4 times per day  . polyethylene glycol  17 g Oral QID  . rosuvastatin  10 mg Oral QPM   Continuous Infusions: . sodium chloride 50 mL/hr at 10/31/14 1034  . diltiazem (CARDIZEM) infusion 10 mg/hr (11/02/14 0723)  . heparin 1,300 Units/hr (11/02/14 0854)   Time spent: 25 minutes  Marzetta Board  [facility] Pager [phone]. If 7PM-7AM, please contact night-coverage at [email or web address], password [facility] 11/02/2014, 10:23 AM  LOS: 14 days

## 2014-11-03 ENCOUNTER — Inpatient Hospital Stay (HOSPITAL_COMMUNITY): Payer: Medicare Other

## 2014-11-03 LAB — HEPARIN LEVEL (UNFRACTIONATED): HEPARIN UNFRACTIONATED: 0.4 [IU]/mL (ref 0.30–0.70)

## 2014-11-03 LAB — BASIC METABOLIC PANEL
Anion gap: 11 (ref 5–15)
BUN: 17 mg/dL (ref 6–20)
CALCIUM: 8.8 mg/dL — AB (ref 8.9–10.3)
CHLORIDE: 100 mmol/L — AB (ref 101–111)
CO2: 23 mmol/L (ref 22–32)
Creatinine, Ser: 1.55 mg/dL — ABNORMAL HIGH (ref 0.61–1.24)
GFR calc Af Amer: 48 mL/min — ABNORMAL LOW (ref >60)
GFR, EST NON AFRICAN AMERICAN: 41 mL/min — AB (ref >60)
GLUCOSE: 103 mg/dL — AB (ref 65–99)
POTASSIUM: 3.4 mmol/L — AB (ref 3.5–5.1)
Sodium: 134 mmol/L — ABNORMAL LOW (ref 135–145)

## 2014-11-03 LAB — GLUCOSE, CAPILLARY
GLUCOSE-CAPILLARY: 113 mg/dL — AB (ref 65–99)
GLUCOSE-CAPILLARY: 108 mg/dL — AB (ref 65–99)
Glucose-Capillary: 100 mg/dL — ABNORMAL HIGH (ref 65–99)
Glucose-Capillary: 121 mg/dL — ABNORMAL HIGH (ref 65–99)
Glucose-Capillary: 98 mg/dL (ref 65–99)

## 2014-11-03 LAB — CBC
HEMATOCRIT: 29 % — AB (ref 39.0–52.0)
Hemoglobin: 9.7 g/dL — ABNORMAL LOW (ref 13.0–17.0)
MCH: 31.5 pg (ref 26.0–34.0)
MCHC: 33.4 g/dL (ref 30.0–36.0)
MCV: 94.2 fL (ref 78.0–100.0)
PLATELETS: 385 10*3/uL (ref 150–400)
RBC: 3.08 MIL/uL — ABNORMAL LOW (ref 4.22–5.81)
RDW: 15.4 % (ref 11.5–15.5)
WBC: 16.2 10*3/uL — AB (ref 4.0–10.5)

## 2014-11-03 NOTE — Progress Notes (Addendum)
[facility] PROGRESS NOTE Interim History: 79 y.o. male  has a past medical history of Diabetes mellitus type 2 in obese; Diabetic neuropathy; Hypertension; GERD  Gout; Left knee DJD; Right knee DJD; PONV  Bilateral hearing loss; Chronic renal insufficiency, stage III (moderate); Dysrhythmia; and On supplemental oxygen therapy. Presented with fall while at the grocery store, Patient lost balance and fell hitting his HEAD and left hip he was not able to get up. EMS was called and he arrived to ER. Plain imaging showed Intertochanteric fracture of the left hip. Patient with hx of A.fib on Xarelto last dose this AM and coreg. On arrival to ER he was tachycardic with HR up to 130 with A.fib he was given a dose of diltiazem and improved with HR down to 80's currently converted to sinus rhythm with heart rate of 70. Develop Ileus, NG tube place, GI consult Colonoscopy done showed small areas of ischemia. CT abd and pelvis showed improved iLeus, lactic acid 1.0.  HPI/Subjective: - continues to feel well - complains of intermittent hallucinations overnight and per RN has some confusion - no chest pain, shortness of breath, no abdominal pain, nausea or vomiting.   Assessment/Plan:  Ileus, postoperative - worsening, s/p decompression 6/3 - mobilize with PT - discontinued narcotics 6/2, pain controlled without  - Cont Reglan, continue IVF, erythromycin - Keep mag. > 2.0 and K >4.0. - repeat abdominal film with persistent gaseous distention of small bowel loops, significant bowel wall thickening of cecum and ascending colon   Atrial fibrillation with RVR: - Cardiology was consulted he was started on IV diltiazem, rate controlled. When able to take orals can change to po diltiazem. Remain on full liquid diet today  - Cont IV hepatin IV heparin, probably can change to NOAC's once diet is advanced, but now his ileus is not improving  Left closed hip fracture: - Orthopedic surgery was  consulted and into her trochanteric IM nailing performed on 10/18/2014. - Physical therapy was consulted which recommended skilled nursing facility - Cont physical therapy  Intermittent nightly confusion - stable - discussed with patient and wife that it appears that in 2008 Alzheimer was added to his diagnosis. I currently have no data to support that and will need outpatient follow up.   Acute on Chronic kidney see stage III: - Most likely prerenal due to decreased oral intake. - Resume IV hydration not able to keep up with needs - Cr stable  Prostate cancer  Diabetes mellitus type 2 in obese: - decrease lantus - BG well controlled.  Essential  Hypertension - Bp stable.   Code Status: Full Code Family Communication: d/w patient and wife bedside Disposition Plan: SNF when ileus better  Consultants:  GI  Cardiology   Procedures:  abd x-ray  Colonoscopy 5.26.2016: Multiple short discrete areas of ischemia in the sigmoid descending and transverse with normal bowel in between an air and fluid suctioned as above and no other obvious findings in unprepped exam  CT abd and pelvis 5.26.2016: Decompressed stomach, small bowel and colon following NG tube  insertion and decompression colonoscopy.  Antibiotics:  None  Objective: Filed Vitals:   11/02/14 0400 11/02/14 1307 11/02/14 1955 11/03/14 0425  BP: 105/49 104/49 131/58 132/58  Pulse: 67 71 78 77  Temp: 98.2 F (36.8 C) 98.7 F (37.1 C) 98.6 F (37 C) 98 F (36.7 C)  TempSrc: Oral Oral Oral Oral  Resp: 18 19 16 16   Height:      Weight:  SpO2: 90% 94% 93% 93%    Intake/Output Summary (Last 24 hours) at 11/03/14 1107 Last data filed at 11/03/14 0834  Gross per 24 hour  Intake    475 ml  Output    650 ml  Net   -175 ml   Filed Weights   10/24/14 1442  Weight: 101.606 kg (224 lb)   Exam: General: NAD HEENT: No bruits, no goiter.  Heart: Regular rate and rhythm. Lungs: Good air  movement,clear Abdomen: Soft, no tenderness, still distended, no bowel sounds.  Neuro: Grossly intact, nonfocal. Skin: pale, no rashes  Data Reviewed: Basic Metabolic Panel:  Recent Labs Lab 10/28/14 0427 10/29/14 0329 10/30/14 0345 10/31/14 0448 11/02/14 0347 11/03/14 0600  NA 132* 133* 133* 131* 132* 134*  K 3.9 4.7 5.1 4.9 3.9 3.4*  CL 101 102 102 101 99* 100*  CO2 25 25 23  21* 23 23  GLUCOSE 108* 124* 132* 151* 142* 103*  BUN 21* 18 13 15 20 17   CREATININE 1.18 1.27* 1.34* 1.60* 1.80* 1.55*  CALCIUM 8.7* 8.9 9.0 9.1 8.8* 8.8*  MG 1.6*  --   --   --  1.7  --   PHOS  --   --   --   --  3.6  --     CBC:  Recent Labs Lab 10/30/14 0345 10/31/14 0448 11/01/14 0628 11/02/14 0347 11/03/14 0600  WBC 13.3* 18.5* 21.1* 17.3* 16.2*  HGB 11.4* 11.4* 10.5* 9.9* 9.7*  HCT 33.4* 33.9* 32.0* 29.4* 29.0*  MCV 92.8 94.4 93.6 93.3 94.2  PLT 324 380 427* 390 385    CBG:  Recent Labs Lab 11/02/14 1119 11/02/14 1613 11/02/14 1959 11/03/14 0005 11/03/14 0422  GLUCAP 132* 117* 119* 100* 113*    Recent Results (from the past 240 hour(s))  Clostridium Difficile by PCR     Status: None   Collection Time: 10/26/14  8:40 AM  Result Value Ref Range Status   C difficile by pcr NEGATIVE NEGATIVE Final     Studies: Dg Abd 1 View  11/01/2014   CLINICAL DATA:  Post decompression of the colon.  EXAM: ABDOMEN - 1 VIEW  COMPARISON:  11/01/2014  FINDINGS: Decreasing gaseous distention of the colon since prior study. Continued small bowel dilatation. Prior cholecystectomy. No free air organomegaly.  IMPRESSION: Decreasing gaseous distention of the colon with continued moderate gaseous distension of small bowel.   Electronically Signed   By: Rolm Baptise M.D.   On: 11/01/2014 12:38   Dg Abd 2 Views  11/03/2014   CLINICAL DATA:  Abdominal distention, bowel obstruction, nasogastric tube, post LEFT hip surgery, history diabetes, hypertension, GERD  EXAM: ABDOMEN - 2 VIEW  COMPARISON:   11/01/2014  FINDINGS: Unknown position of tip of nasogastric tube, question coiled within a distended stomach versus beyond ligament of Treitz in proximal jejunum.  Persistent significant gaseous distention of small bowel loops.  Paucity of colonic gas.  Significant bowel wall thickening of the cecum and ascending colon high suspicious for colitis.  No free intraperitoneal air.  Mild distention of the urinary bladder.  Catheter projects over inferior bladder.  BILATERAL pelvic surgical clips.  Bones demineralized with orthopedic harbor noted at the proximal LEFT femur.  IMPRESSION: Significant bowel wall thickening of cecum and ascending colon highly suspicious for colitis.  Persistent gaseous distention of small bowel loops which could be related to obstruction or ileus.   Electronically Signed   By: Lavonia Dana M.D.   On: 11/03/2014 10:47  Scheduled Meds: . allopurinol  300 mg Oral q morning - 10a  . erythromycin  250 mg Intravenous 3 times per day  . insulin glargine  20 Units Subcutaneous QHS  . metoCLOPramide (REGLAN) injection  10 mg Intravenous 4 times per day  . polyethylene glycol  17 g Oral QID  . rosuvastatin  10 mg Oral QPM   Continuous Infusions: . sodium chloride 50 mL/hr at 10/31/14 1034  . diltiazem (CARDIZEM) infusion 10 mg/hr (11/03/14 0229)  . heparin 1,300 Units/hr (11/02/14 1712)   Time spent: 25 minutes, > 50% bedside in family discussions with patient/wife  Marzetta Board  [facility] Pager [phone]. If 7PM-7AM, please contact night-coverage at [email or web address], password [facility] 11/03/2014, 11:07 AM  LOS: 15 days

## 2014-11-03 NOTE — Progress Notes (Signed)
Subjective: Less abdominal distention. Nasogastric tube draining well over 1 liter today of dark brown fluid.  Objective: Vital signs in last 24 hours: Temp:  [98 F (36.7 C)-98.6 F (37 C)] 98.1 F (36.7 C) (06/05 1423) Pulse Rate:  [77-92] 92 (06/05 1423) Resp:  [16-17] 17 (06/05 1423) BP: (105-132)/(58-70) 119/70 mmHg (06/05 1423) SpO2:  [93 %-96 %] 96 % (06/05 1423) Weight change:  Last BM Date: 11/02/14  PE: GEN:  Chronically ill-appearing but NAD ABD:  Mild distention but very much improved progressively over the past couple days; non-tender without peritonitis.  Lab Results: CBC    Component Value Date/Time   WBC 16.2* 11/03/2014 0600   RBC 3.08* 11/03/2014 0600   HGB 9.7* 11/03/2014 0600   HCT 29.0* 11/03/2014 0600   PLT 385 11/03/2014 0600   MCV 94.2 11/03/2014 0600   MCH 31.5 11/03/2014 0600   MCHC 33.4 11/03/2014 0600   RDW 15.4 11/03/2014 0600   LYMPHSABS 1.6 10/24/2014 1902   MONOABS 1.3* 10/24/2014 1902   EOSABS 0.0 10/24/2014 1902   BASOSABS 0.0 10/24/2014 1902   CMP     Component Value Date/Time   NA 134* 11/03/2014 0600   K 3.4* 11/03/2014 0600   CL 100* 11/03/2014 0600   CO2 23 11/03/2014 0600   GLUCOSE 103* 11/03/2014 0600   BUN 17 11/03/2014 0600   CREATININE 1.55* 11/03/2014 0600   CALCIUM 8.8* 11/03/2014 0600   PROT 5.0* 11/02/2014 0347   ALBUMIN 2.3* 11/02/2014 0347   AST 29 11/02/2014 0347   ALT 53 11/02/2014 0347   ALKPHOS 108 11/02/2014 0347   BILITOT 1.1 11/02/2014 0347   GFRNONAA 41* 11/03/2014 0600   GFRAA 48* 11/03/2014 0600   Abdominal xray, which I have personally reviewed:  Suspicious for colitis in proximal colon; no colonic dilatation; dilated loops of small bowel.  Assessment:  1.  Colonic ileus, much improved after decompressive colonoscopy couple days ago. 2.  Possible ascending colitis on abdominal xray; no colitis seen on colonoscopy couple days ago and no abdominal pain to suggest ischemic colitis; suspect this  might be intramural edema from profound protein calorie malnutrition and hypoalbuminemia. 3.  Dilated small bowel loops consistent with small bowel ileus, significant drainage with nasogastric tube decompression.  Plan:  1.  Continue NGT suction for another day. 2.  Recheck abdominal xray tomorrow. 3.  Pending abdominal xray, might clamp NGT and start sips clear liquids tomorrow; might be able to remove rectal tube as well. 4.  Will follow; case discussed at length with patient's wife who is at the bedside.   Landry Dyke 11/03/2014, 3:22 PM   Pager [phone] If no answer or after 5 PM call [phone]

## 2014-11-03 NOTE — Progress Notes (Signed)
ANTICOAGULATION CONSULT NOTE - Follow Up Consult  Pharmacy Consult for Heparin Indication: atrial fibrillation  No Known Allergies  Patient Measurements: Height: 5\' 10"  (177.8 cm) Weight: 224 lb (101.606 kg) IBW/kg (Calculated) : 73 Heparin Dosing Weight: 94 kg  Vital Signs: Temp: 98 F (36.7 C) (06/05 0425) Temp Source: Oral (06/05 0425) BP: 132/58 mmHg (06/05 0425) Pulse Rate: 77 (06/05 0425)  Labs:  Recent Labs  11/01/14 0628  11/02/14 0347 11/02/14 0625 11/02/14 1635 11/03/14 0600  HGB 10.5*  --  9.9*  --   --  9.7*  HCT 32.0*  --  29.4*  --   --  29.0*  PLT 427*  --  390  --   --  385  HEPARINUNFRC 1.54*  < >  --  0.26* 0.53 0.40  CREATININE  --   --  1.80*  --   --  1.55*  < > = values in this interval not displayed.  Estimated Creatinine Clearance: 47.6 mL/min (by C-G formula based on Cr of 1.55).  Assessment: 79 yo male with hx afib on xarelto PTA. He is s/p fall with hip fracture and repair on 5/22 and and in now noted with post-op ileus > NPO.  IV heparin started and today he remains therapeutic with HL of 0.4 on IV heparin rate of 1300 units/hr.  His CBC and platelets are stable and there is no noted bleeding complications.  Goal of Therapy:  Heparin level 0.3-0.7 units/ml Monitor platelets by anticoagulation protocol: Yes   Plan:  - Continue IV heparin at 1300 units/hr (13 ml/hr) - Will continue to monitor for any signs/symptoms of bleeding and will follow up with heparin level and CBC daily  Rober Minion, PharmD., MS Clinical Pharmacist Pager:  [phone] Thank you for allowing pharmacy to be part of this patients care team. 11/03/2014 10:36 AM

## 2014-11-03 NOTE — Progress Notes (Signed)
No change in d/c plan. Per MD note- plan d/c when ileus is better.  Current d/c plan is for d/c to [facility] per weekend report.  CSW services will continue to monitor.  Lorie Phenix. Pauline Good, [place]

## 2014-11-03 NOTE — Progress Notes (Signed)
Pt. appeared A&Ox4 during initial assessment. During the night Pt. became confused, and talking in his sleep. Pt. Was not aggitated or combative, just showed signs of confusion. 1:34 AM Shirley Muscat

## 2014-11-04 ENCOUNTER — Inpatient Hospital Stay (HOSPITAL_COMMUNITY): Payer: Medicare Other

## 2014-11-04 ENCOUNTER — Encounter (HOSPITAL_COMMUNITY): Payer: Self-pay | Admitting: Gastroenterology

## 2014-11-04 DIAGNOSIS — K567 Ileus, unspecified: Secondary | ICD-10-CM | POA: Insufficient documentation

## 2014-11-04 LAB — CBC
HEMATOCRIT: 33.9 % — AB (ref 39.0–52.0)
HEMOGLOBIN: 11.2 g/dL — AB (ref 13.0–17.0)
MCH: 30.9 pg (ref 26.0–34.0)
MCHC: 33 g/dL (ref 30.0–36.0)
MCV: 93.4 fL (ref 78.0–100.0)
Platelets: 432 10*3/uL — ABNORMAL HIGH (ref 150–400)
RBC: 3.63 MIL/uL — ABNORMAL LOW (ref 4.22–5.81)
RDW: 15.2 % (ref 11.5–15.5)
WBC: 14.7 10*3/uL — AB (ref 4.0–10.5)

## 2014-11-04 LAB — BASIC METABOLIC PANEL
ANION GAP: 16 — AB (ref 5–15)
BUN: 14 mg/dL (ref 6–20)
CHLORIDE: 93 mmol/L — AB (ref 101–111)
CO2: 20 mmol/L — AB (ref 22–32)
Calcium: 8.4 mg/dL — ABNORMAL LOW (ref 8.9–10.3)
Creatinine, Ser: 1.32 mg/dL — ABNORMAL HIGH (ref 0.61–1.24)
GFR calc Af Amer: 58 mL/min — ABNORMAL LOW (ref >60)
GFR, EST NON AFRICAN AMERICAN: 50 mL/min — AB (ref >60)
Glucose, Bld: 376 mg/dL — ABNORMAL HIGH (ref 65–99)
POTASSIUM: 2.8 mmol/L — AB (ref 3.5–5.1)
SODIUM: 129 mmol/L — AB (ref 135–145)

## 2014-11-04 LAB — HEPARIN LEVEL (UNFRACTIONATED)
HEPARIN UNFRACTIONATED: 0.5 [IU]/mL (ref 0.30–0.70)
Heparin Unfractionated: 0.29 IU/mL — ABNORMAL LOW (ref 0.30–0.70)

## 2014-11-04 LAB — GLUCOSE, CAPILLARY
Glucose-Capillary: 90 mg/dL (ref 65–99)
Glucose-Capillary: 81 mg/dL (ref 65–99)
Glucose-Capillary: 74 mg/dL (ref 65–99)
Glucose-Capillary: 137 mg/dL — ABNORMAL HIGH (ref 65–99)
Glucose-Capillary: 115 mg/dL — ABNORMAL HIGH (ref 65–99)
Glucose-Capillary: 120 mg/dL — ABNORMAL HIGH (ref 65–99)

## 2014-11-04 LAB — MAGNESIUM: Magnesium: 1.6 mg/dL — ABNORMAL LOW (ref 1.7–2.4)

## 2014-11-04 MED ORDER — AMIODARONE HCL IN DEXTROSE 360-4.14 MG/200ML-% IV SOLN
30.0000 mg/h | INTRAVENOUS | Status: DC
  Administered 2014-11-04 – 2014-11-08 (×8): 30 mg/h via INTRAVENOUS
  Filled 2014-11-04 (×15): qty 200

## 2014-11-04 MED ORDER — IOHEXOL 300 MG/ML  SOLN
100.0000 mL | Freq: Once | INTRAMUSCULAR | Status: AC | PRN
  Administered 2014-11-04: 100 mL via INTRAVENOUS

## 2014-11-04 MED ORDER — POTASSIUM CHLORIDE 10 MEQ/100ML IV SOLN
10.0000 meq | INTRAVENOUS | Status: AC
  Administered 2014-11-05 (×2): 10 meq via INTRAVENOUS
  Filled 2014-11-04 (×2): qty 100

## 2014-11-04 MED ORDER — AMIODARONE HCL IN DEXTROSE 360-4.14 MG/200ML-% IV SOLN
60.0000 mg/h | INTRAVENOUS | Status: AC
  Administered 2014-11-04 (×2): 60 mg/h via INTRAVENOUS
  Filled 2014-11-04: qty 200

## 2014-11-04 MED ORDER — POTASSIUM CHLORIDE 10 MEQ/100ML IV SOLN
10.0000 meq | INTRAVENOUS | Status: AC
  Administered 2014-11-04 (×4): 10 meq via INTRAVENOUS
  Filled 2014-11-04 (×6): qty 100

## 2014-11-04 MED ORDER — DEXTROSE-NACL 5-0.45 % IV SOLN
INTRAVENOUS | Status: DC
  Administered 2014-11-04 – 2014-11-12 (×6): via INTRAVENOUS

## 2014-11-04 MED ORDER — AMIODARONE LOAD VIA INFUSION
150.0000 mg | Freq: Once | INTRAVENOUS | Status: AC
  Administered 2014-11-04: 150 mg via INTRAVENOUS
  Filled 2014-11-04: qty 83.34

## 2014-11-04 NOTE — Progress Notes (Signed)
  K 2.8.  KCL x6 runs.  Add Mg level.  Repeat labs in AM.  Correen Bubolz, ANP-BC

## 2014-11-04 NOTE — Progress Notes (Signed)
UR Completed. Jameriah Trotti, RN, BSN.  [phone] 

## 2014-11-04 NOTE — Care Management Note (Signed)
Case Management Note  Patient Details  Name: Howard Charles MRN: [id number] Date of Birth: 1935-06-18  Subjective/Objective:    Pt admitted with Closed Hip Fracture, s/p hip fracture repair pt developed ileus.                 Action/Plan:  Pt has undergone multiple decompression colonoscopies with minimal improvement.  CM will continue to monitor for disposition needs   Expected Discharge Date:                  Expected Discharge Plan:  [place]  In-House Referral:  Clinical Social Work  Discharge planning Services  CM Consult  Post Acute Care Choice:    Choice offered to:     DME Arranged:    DME Agency:     HH Arranged:    [place] Agency:     Status of Service:  In process, will continue to follow  Medicare Important Message Given: Yes Medicare IM give by: [name] Date Given: 11/01/14  Date Additional Medicare IM Given:  10/31/14 Additional Medicare Important Message give by:  [name]  If discussed at Long Length of Stay Meetings, dates discussed:  10/31/14  Additional Comments:  [name], RN 11/04/2014, 9:55 AM

## 2014-11-04 NOTE — Progress Notes (Signed)
Patient ID: Howard Charles, male   DOB: [birth year], 79 y.o.   MRN: [id number]     [address] [place], [place] [address]    Phone: [phone] FAX: [phone]     Subjective: Pt started vomiting and became distended.  He not sure when but it has been several days.  His films today shows dilated small bowel loops and concerns for a SBO.  We have therefore been asked to re-evaluate.  He feels better now with NGT which is on continuous suction.  1475m out yesterday and 300 noted today which is bilious.  He denies nausea or vomiting.    Objective:  Vital signs:  Filed Vitals:   11/03/14 1200 11/03/14 1423 11/03/14 1953 11/04/14 0401  BP: 105/69 119/70 120/62 137/61  Pulse:  92 88 106  Temp:  98.1 F (36.7 C) 98.6 F (37 C) 98.1 F (36.7 C)  TempSrc:  Oral Oral Oral  Resp:  _0 Height:      Weight:      SpO2:  96% 97% 91%    Last BM Date: 11/02/14  Intake/Output   Yesterday:  06/05 0701 - 06/06 0700 In: 0  Out: 23329[Urine:650; Emesis/NG output:1425; Stool:400] This shift:  Total I/O In: 0  Out: 700 [Emesis/NG output:300; Stool:400]  Physical Exam: General: Pt awake/alert/oriented x4 in no acute distress Abdomen: Soft.  distended.  Reducible umbilical hernia.  No evidence of peritonitis.  No incarcerated hernias.    Problem List:   Principal Problem:   Closed left hip fracture Active Problems:   Alzheimer's disease   Diabetes mellitus type 2 in obese   Hypertension   Chronic renal insufficiency, stage III (moderate)   A-fib   Prostate cancer   Atrial fibrillation with RVR   Ileus, postoperative    Results:   Labs: Results for orders placed or performed during the hospital encounter of 10/19/14 (from the past 48 hour(s))  Glucose, capillary     Status: Abnormal   Collection Time: 11/02/14  4:13 PM  Result Value Ref Range   Glucose-Capillary 117 (H) 65 - 99 mg/dL  Heparin level  (unfractionated)     Status: None   Collection Time: 11/02/14  4:35 PM  Result Value Ref Range   Heparin Unfractionated 0.53 0.30 - 0.70 IU/mL    Comment:        IF HEPARIN RESULTS ARE BELOW EXPECTED VALUES, AND PATIENT DOSAGE HAS BEEN CONFIRMED, SUGGEST FOLLOW UP TESTING OF ANTITHROMBIN III LEVELS.   Glucose, capillary     Status: Abnormal   Collection Time: 11/02/14  7:59 PM  Result Value Ref Range   Glucose-Capillary 119 (H) 65 - 99 mg/dL  Glucose, capillary     Status: Abnormal   Collection Time: 11/03/14 12:05 AM  Result Value Ref Range   Glucose-Capillary 100 (H) 65 - 99 mg/dL  Glucose, capillary     Status: Abnormal   Collection Time: 11/03/14  4:22 AM  Result Value Ref Range   Glucose-Capillary 113 (H) 65 - 99 mg/dL  Heparin level (unfractionated)     Status: None   Collection Time: 11/03/14  6:00 AM  Result Value Ref Range   Heparin Unfractionated 0.40 0.30 - 0.70 IU/mL    Comment:        IF HEPARIN RESULTS ARE BELOW EXPECTED VALUES, AND PATIENT DOSAGE HAS BEEN CONFIRMED, SUGGEST FOLLOW UP TESTING OF ANTITHROMBIN III LEVELS.  CBC     Status: Abnormal   Collection Time: 11/03/14  6:00 AM  Result Value Ref Range   WBC 16.2 (H) 4.0 - 10.5 K/uL   RBC 3.08 (L) 4.22 - 5.81 MIL/uL   Hemoglobin 9.7 (L) 13.0 - 17.0 g/dL   HCT 29.0 (L) 39.0 - 52.0 %   MCV 94.2 78.0 - 100.0 fL   MCH 31.5 26.0 - 34.0 pg   MCHC 33.4 30.0 - 36.0 g/dL   RDW 15.4 11.5 - 15.5 %   Platelets 385 150 - 400 K/uL  Basic metabolic panel     Status: Abnormal   Collection Time: 11/03/14  6:00 AM  Result Value Ref Range   Sodium 134 (L) 135 - 145 mmol/L   Potassium 3.4 (L) 3.5 - 5.1 mmol/L   Chloride 100 (L) 101 - 111 mmol/L   CO2 23 22 - 32 mmol/L   Glucose, Bld 103 (H) 65 - 99 mg/dL   BUN 17 6 - 20 mg/dL   Creatinine, Ser 1.55 (H) 0.61 - 1.24 mg/dL   Calcium 8.8 (L) 8.9 - 10.3 mg/dL   GFR calc non Af Amer 41 (L) >60 mL/min   GFR calc Af Amer 48 (L) >60 mL/min    Comment: (NOTE) The eGFR  has been calculated using the CKD EPI equation. This calculation has not been validated in all clinical situations. eGFR's persistently <60 mL/min signify possible Chronic Kidney Disease.    Anion gap 11 5 - 15  Glucose, capillary     Status: Abnormal   Collection Time: 11/03/14 11:21 AM  Result Value Ref Range   Glucose-Capillary 121 (H) 65 - 99 mg/dL  Glucose, capillary     Status: None   Collection Time: 11/03/14  4:01 PM  Result Value Ref Range   Glucose-Capillary 98 65 - 99 mg/dL  Glucose, capillary     Status: Abnormal   Collection Time: 11/03/14  7:50 PM  Result Value Ref Range   Glucose-Capillary 108 (H) 65 - 99 mg/dL  Glucose, capillary     Status: None   Collection Time: 11/04/14  1:18 AM  Result Value Ref Range   Glucose-Capillary 90 65 - 99 mg/dL  Glucose, capillary     Status: None   Collection Time: 11/04/14  3:20 AM  Result Value Ref Range   Glucose-Capillary 81 65 - 99 mg/dL  Heparin level (unfractionated)     Status: Abnormal   Collection Time: 11/04/14  4:27 AM  Result Value Ref Range   Heparin Unfractionated 0.29 (L) 0.30 - 0.70 IU/mL    Comment:        IF HEPARIN RESULTS ARE BELOW EXPECTED VALUES, AND PATIENT DOSAGE HAS BEEN CONFIRMED, SUGGEST FOLLOW UP TESTING OF ANTITHROMBIN III LEVELS.   CBC     Status: Abnormal   Collection Time: 11/04/14  4:27 AM  Result Value Ref Range   WBC 14.7 (H) 4.0 - 10.5 K/uL   RBC 3.63 (L) 4.22 - 5.81 MIL/uL   Hemoglobin 11.2 (L) 13.0 - 17.0 g/dL   HCT 33.9 (L) 39.0 - 52.0 %   MCV 93.4 78.0 - 100.0 fL   MCH 30.9 26.0 - 34.0 pg   MCHC 33.0 30.0 - 36.0 g/dL   RDW 15.2 11.5 - 15.5 %   Platelets 432 (H) 150 - 400 K/uL  Glucose, capillary     Status: None   Collection Time: 11/04/14  8:51 AM  Result Value Ref Range   Glucose-Capillary 74 65 - 99  mg/dL   Comment 1 Notify RN     Imaging / Studies: Dg Abd 2 Views  11/04/2014   CLINICAL DATA:  Abdominal distention.  EXAM: ABDOMEN - 2 VIEW  COMPARISON:  11/03/2014,  11/01/2014.  FINDINGS: NG tube tip below hemidiaphragms. Persistent prominent small bowel distention with a paucity of intra colonic gas. These findings are consistent with small bowel obstruction. No free air identified. Surgical clips in the pelvis. Postsurgical changes left hip. Degenerative changes lumbar spine right hip.  IMPRESSION: Persistent severely distended small bowel with paucity of intra colonic bowel gas. These findings are consistent with small bowel obstruction. NG tube tip noted below the hemidiaphragms.   Electronically Signed   By: Marcello Moores  Register   On: 11/04/2014 08:29   Dg Abd 2 Views  11/03/2014   CLINICAL DATA:  Abdominal distention, bowel obstruction, nasogastric tube, post LEFT hip surgery, history diabetes, hypertension, GERD  EXAM: ABDOMEN - 2 VIEW  COMPARISON:  11/01/2014  FINDINGS: Unknown position of tip of nasogastric tube, question coiled within a distended stomach versus beyond ligament of Treitz in proximal jejunum.  Persistent significant gaseous distention of small bowel loops.  Paucity of colonic gas.  Significant bowel wall thickening of the cecum and ascending colon high suspicious for colitis.  No free intraperitoneal air.  Mild distention of the urinary bladder.  Catheter projects over inferior bladder.  BILATERAL pelvic surgical clips.  Bones demineralized with orthopedic harbor noted at the proximal LEFT femur.  IMPRESSION: Significant bowel wall thickening of cecum and ascending colon highly suspicious for colitis.  Persistent gaseous distention of small bowel loops which could be related to obstruction or ileus.   Electronically Signed   By: Lavonia Dana M.D.   On: 11/03/2014 10:47    Medications / Allergies:  Scheduled Meds: . allopurinol  300 mg Oral q morning - 10a  . erythromycin  250 mg Intravenous 3 times per day  . insulin glargine  20 Units Subcutaneous QHS  . metoCLOPramide (REGLAN) injection  10 mg Intravenous 4 times per day  . polyethylene glycol   17 g Oral QID  . rosuvastatin  10 mg Oral QPM   Continuous Infusions: . sodium chloride 50 mL/hr at 10/31/14 1034  . amiodarone 60 mg/hr (11/04/14 1058)   Followed by  . amiodarone    . dextrose 5 % and 0.45% NaCl 50 mL/hr at 11/04/14 1040  . heparin 1,450 Units/hr (11/04/14 0636)   PRN Meds:.acetaminophen **OR** acetaminophen, alum & mag hydroxide-simeth, menthol-cetylpyridinium **OR** phenol, ondansetron **OR** ondansetron (ZOFRAN) IV, promethazine  Antibiotics: Anti-infectives    Start     Dose/Rate Route Frequency Ordered Stop   10/24/14 1400  erythromycin 250 mg in sodium chloride 0.9 % 100 mL IVPB     250 mg 100 mL/hr over 60 Minutes Intravenous 3 times per day 10/24/14 1239     10/23/14 1100  fluconazole (DIFLUCAN) IVPB 100 mg  Status:  Discontinued     100 mg 50 mL/hr over 60 Minutes Intravenous Every 24 hours 10/23/14 1043 11/01/14 0909   10/20/14 1230  ceFAZolin (ANCEF) IVPB 2 g/50 mL premix     2 g 100 mL/hr over 30 Minutes Intravenous Every 6 hours 10/20/14 1114 10/20/14 1912        Assessment/Plan S/p decompressive colonoscopy x2 colsed hip Fx s/p IM nail  PAF Abdominal pain, nausea, vomiting SBO versus ileus Will proceed with a CT of abdomen and pelvis for further evaluation.  NPO, NGT to LWIS instead of continuous  Repeat BMP prior to CT.  Also need to check K and supplement as needed.  May need to consider parenteral nutrition if he does not open up soon Would hold off on miralax  Erby Pian, [facility] Surgery Pager [phone]) For consults and floor pages call [phone])  11/04/2014 11:33 AM

## 2014-11-04 NOTE — Progress Notes (Signed)
Physical Therapy Treatment Patient Details Name: Howard Charles MRN: [id number] DOB: 1935-09-14 Today's Date: 11/04/2014    History of Present Illness Pt is a 79 y/o M s/p fall and L hip fx.  Pt's PMH includes DM, diabetic neuropathy, gout, HTN, B TKA, B hearing loss, chronic renal insufficiency, on supplemental O2 at night, a fib, rotator cuff arthroscopy.    PT Comments    Pt has undergone decompression on 6/3 with NG tube replaced and new rectal tube since last session. Clarise Cruz Plus was utilized for standing activity x3 to increase tolerance for functional activity. Noted that per ortho note pt was +1 assist for mobility, however with therapy pt continues to require +2 assist for safe transfers and ambulation. Pt very fatigued after x3 sit<>stands with use of Sara lift. Will continue to follow and progress as able per POC.   Follow Up Recommendations  SNF;Supervision for mobility/OOB     Equipment Recommendations  Rolling walker with 5" wheels    Recommendations for Other Services       Precautions / Restrictions Precautions Precautions: Fall Precaution Comments: NG tube, rectal tube Restrictions Weight Bearing Restrictions: Yes LLE Weight Bearing: Weight bearing as tolerated    Mobility  Bed Mobility Overal bed mobility: Needs Assistance Bed Mobility: Rolling;Sidelying to Sit Rolling: Mod assist Sidelying to sit: Mod assist       General bed mobility comments: Pt required assist to progress LEs to EOB and use of bed pad to rotate hips. He was able to use UEs to assist with rolling and required Mod A to elevate trunk.   Transfers Overall transfer level: Needs assistance Equipment used: Ambulation equipment used Transfers: Sit to/from Stand Sit to Stand: Total assist         General transfer comment: Clarise Cruz Plus was utilized for sit<>stand x3 this session. First attempt pt was able to hold standing for ~1 minute, second attempt ~30 seconds, and third attempt ~20  seconds.   Ambulation/Gait       Gait Pattern/deviations: Step-through pattern;Decreased stride length;Trunk flexed Gait velocity: Decreased Gait velocity interpretation: Below normal speed for age/gender General Gait Details: Unable this session. Pt very weak and also hooked up to wall suction at this time through NG tube.    Stairs            Wheelchair Mobility    Modified Rankin (Stroke Patients Only)       Balance Overall balance assessment: Needs assistance;History of Falls Sitting-balance support: Bilateral upper extremity supported;Feet supported Sitting balance-Leahy Scale: Poor                              Cognition Arousal/Alertness: Awake/alert Behavior During Therapy: WFL for tasks assessed/performed Overall Cognitive Status: History of cognitive impairments - at baseline                      Exercises Total Joint Exercises Ankle Circles/Pumps: 15 reps Quad Sets: 10 reps Short Arc Quad: 10 reps Hip ABduction/ADduction: 10 reps Hip Abduction/Adduction Limitations: RLE used to assist LLE for ROM. Overall limited range demonstrated.     General Comments        Pertinent Vitals/Pain Pain Assessment: Faces Faces Pain Scale: Hurts even more Pain Location: LLE with movement, neck pain (pt states he may have strained it with transfer last time he got up over the weekend).  Pain Descriptors / Indicators: Aching Pain Intervention(s): Limited activity within patient's  tolerance;Monitored during session;Repositioned;Heat applied    Home Living                      Prior Function            PT Goals (current goals can now be found in the care plan section) Acute Rehab PT Goals Patient Stated Goal: Decrease neck pain PT Goal Formulation: With patient Time For Goal Achievement: 11/07/14 Potential to Achieve Goals: Good Progress towards PT goals: Progressing toward goals    Frequency  Min 3X/week    PT Plan Current  plan remains appropriate    Co-evaluation             End of Session Equipment Utilized During Treatment: Gait belt Activity Tolerance: Patient limited by fatigue Patient left: in chair;with call bell/phone within reach     Time: 0959-1032 PT Time Calculation (min) (ACUTE ONLY): 33 min  Charges:  $Therapeutic Exercise: 8-22 mins $Therapeutic Activity: 8-22 mins                    G Codes:      Rolinda Roan 12-02-2014, 11:36 AM   Rolinda Roan, PT, DPT Acute Rehabilitation Services Pager: [phone]

## 2014-11-04 NOTE — Progress Notes (Signed)
ANTICOAGULATION CONSULT NOTE - Follow Up Consult  Pharmacy Consult for Heparin Indication: atrial fibrillation  No Known Allergies  Patient Measurements: Height: 5\' 10"  (177.8 cm) Weight: 224 lb (101.606 kg) IBW/kg (Calculated) : 73 Heparin Dosing Weight: 94 kg  Vital Signs: Temp: 98.1 F (36.7 C) (06/06 0401) Temp Source: Oral (06/06 0401) BP: 137/61 mmHg (06/06 0401) Pulse Rate: 106 (06/06 0401)  Labs:  Recent Labs  11/02/14 0347  11/02/14 1635 11/03/14 0600 11/04/14 0427  HGB 9.9*  --   --  9.7* 11.2*  HCT 29.4*  --   --  29.0* 33.9*  PLT 390  --   --  385 432*  HEPARINUNFRC  --   < > 0.53 0.40 0.29*  CREATININE 1.80*  --   --  1.55*  --   < > = values in this interval not displayed.  Estimated Creatinine Clearance: 47.6 mL/min (by C-G formula based on Cr of 1.55).  Assessment: 79 yo male with hx afib on xarelto PTA. He is s/p fall with hip fracture and repair on 5/22 and and in now noted with post-op ileus > NPO.  IV heparin continues - heparin level slightly subtherapeutic at 0.29 on 1300 units/hr. CBC stable. No bleeding noted.  Goal of Therapy:  Heparin level 0.3-0.7 units/ml Monitor platelets by anticoagulation protocol: Yes   Plan:  - Increase heparin to 1450 units/hr - Will f/u 8 hour heparin level - Will continue to monitor for any signs/symptoms of bleeding   Sherlon Handing, PharmD, BCPS Clinical pharmacist, pager [phone] you for allowing pharmacy to be part of this patients care team. 11/04/2014 5:56 AM

## 2014-11-04 NOTE — Progress Notes (Signed)
Patient ID: Howard Charles, male   DOB: Aug 26, 1935, 79 y.o.   MRN: [id number] I saw this patient both 11/02/2014 and 11/03/2014 from an orthopedic standpoint.  His surgical wounds were well approximated.  Mild drainage on the upper bandage.  Both dressing were changed on 11/03/2014.  On Saturday he got from bed to chair with the assistance of his nurse and me.  On Sunday he walked to his hospital room door and back.  Supervision while ambulating but have to rest 3 or 4 times due to fatigue.  He was 1 + assist to get from bed to sitting and sitting to standing.  Sunday he was able to stand and use the urinal and did pass gas causing the rectal tube to dislodge.  The nurse reinserted the tube without difficulty.  I recommend continuing physical and occupational therapy in the hospital as well as at skilled nursing.  His wife was present with both of my visits.  Verlin Uher A. Kaleen Mask Physician Assistant [facility] [phone]  11/04/2014, 9:20 AM

## 2014-11-04 NOTE — Progress Notes (Signed)
Subjective: Less rectal output. Continued significant nasogastric tube output Some generalized abdominal pain.  Objective: Vital signs in last 24 hours: Temp:  [98.1 F (36.7 C)-98.6 F (37 C)] 98.1 F (36.7 C) 11-23-22 0401) Pulse Rate:  [88-106] 106 2022/11/23 0401) Resp:  [17-18] 18 2022/11/23 0401) BP: (119-137)/(61-70) 137/61 mmHg 11/23/22 0401) SpO2:  [91 %-97 %] 91 % 11/23/22 0401) Weight change:  Last BM Date: 11/02/14  PE: GEN:  Deconditioned but NAD HEENT:  NGT with continued voluminous bilious output ABD:  Protuberant, distended and tympanic (improved cf. Yesterday)  Lab Results: CBC    Component Value Date/Time   WBC 14.7* 11/23/14 0427   RBC 3.63* 2014-11-23 0427   HGB 11.2* Nov 23, 2014 0427   HCT 33.9* 23-Nov-2014 0427   PLT 432* 11/23/2014 0427   MCV 93.4 23-Nov-2014 0427   MCH 30.9 2014/11/23 0427   MCHC 33.0 11-23-2014 0427   RDW 15.2 11-23-2014 0427   LYMPHSABS 1.6 10/24/2014 1902   MONOABS 1.3* 10/24/2014 1902   EOSABS 0.0 10/24/2014 1902   BASOSABS 0.0 10/24/2014 1902   CMP     Component Value Date/Time   NA 134* 11/03/2014 0600   K 3.4* 11/03/2014 0600   CL 100* 11/03/2014 0600   CO2 23 11/03/2014 0600   GLUCOSE 103* 11/03/2014 0600   BUN 17 11/03/2014 0600   CREATININE 1.55* 11/03/2014 0600   CALCIUM 8.8* 11/03/2014 0600   PROT 5.0* 11/02/2014 0347   ALBUMIN 2.3* 11/02/2014 0347   AST 29 11/02/2014 0347   ALT 53 11/02/2014 0347   ALKPHOS 108 11/02/2014 0347   BILITOT 1.1 11/02/2014 0347   GFRNONAA 41* 11/03/2014 0600   GFRAA 48* 11/03/2014 0600   Studies/Results: Dg Abd 2 Views  11/23/2014   CLINICAL DATA:  Abdominal distention.  EXAM: ABDOMEN - 2 VIEW  COMPARISON:  11/03/2014, 11/01/2014.  FINDINGS: NG tube tip below hemidiaphragms. Persistent prominent small bowel distention with a paucity of intra colonic gas. These findings are consistent with small bowel obstruction. No free air identified. Surgical clips in the pelvis. Postsurgical changes  left hip. Degenerative changes lumbar spine right hip.  IMPRESSION: Persistent severely distended small bowel with paucity of intra colonic bowel gas. These findings are consistent with small bowel obstruction. NG tube tip noted below the hemidiaphragms.   Electronically Signed   By: Marcello Moores  Register   On: 2014/11/23 08:29   Dg Abd 2 Views  11/03/2014   CLINICAL DATA:  Abdominal distention, bowel obstruction, nasogastric tube, post LEFT hip surgery, history diabetes, hypertension, GERD  EXAM: ABDOMEN - 2 VIEW  COMPARISON:  11/01/2014  FINDINGS: Unknown position of tip of nasogastric tube, question coiled within a distended stomach versus beyond ligament of Treitz in proximal jejunum.  Persistent significant gaseous distention of small bowel loops.  Paucity of colonic gas.  Significant bowel wall thickening of the cecum and ascending colon high suspicious for colitis.  No free intraperitoneal air.  Mild distention of the urinary bladder.  Catheter projects over inferior bladder.  BILATERAL pelvic surgical clips.  Bones demineralized with orthopedic harbor noted at the proximal LEFT femur.  IMPRESSION: Significant bowel wall thickening of cecum and ascending colon highly suspicious for colitis.  Persistent gaseous distention of small bowel loops which could be related to obstruction or ileus.   Electronically Signed   By: Lavonia Dana M.D.   On: 11/03/2014 10:47   Assessment:  1. Colonic ileus, much improved after decompressive colonoscopy couple days ago. 2. Possible ascending colitis on abdominal xray;  no colitis seen on colonoscopy couple days ago and no abdominal pain to suggest ischemic colitis; suspect this might be intramural edema from profound protein calorie malnutrition and hypoalbuminemia. 3. Dilated small bowel loops, pSBO versus small bowel ileus; significant drainage with nasogastric tube decompression.  Plan:  1.  Keep NGT to suction. 2.  Will remove rectal tube (colon is not  distended). 3.  Increase ambulation/mobility as tolerated. 4.  Surgical consultation appreciated; CT scan to be ordered. 5.  Will follow.   Landry Dyke 11/04/2014, 12:06 PM   Pager [phone] If no answer or after 5 PM call [phone]

## 2014-11-04 NOTE — Progress Notes (Signed)
ANTICOAGULATION CONSULT NOTE - Follow Up Consult  Pharmacy Consult for Heparin Indication: atrial fibrillation  No Known Allergies  Patient Measurements: Height: 5\' 10"  (177.8 cm) Weight: 224 lb (101.606 kg) IBW/kg (Calculated) : 73 Heparin Dosing Weight: 94 kg  Vital Signs: Temp: 98 F (36.7 C) (06/06 1349) Temp Source: Oral (06/06 1349) BP: 125/65 mmHg (06/06 1349) Pulse Rate: 95 (06/06 1349)  Labs:  Recent Labs  11/02/14 0347  11/03/14 0600 11/04/14 0427 11/04/14 1425  HGB 9.9*  --  9.7* 11.2*  --   HCT 29.4*  --  29.0* 33.9*  --   PLT 390  --  385 432*  --   HEPARINUNFRC  --   < > 0.40 0.29* 0.50  CREATININE 1.80*  --  1.55*  --  1.32*  < > = values in this interval not displayed.  Estimated Creatinine Clearance: 55.9 mL/min (by C-G formula based on Cr of 1.32).  Assessment: 79 yo male with hx afib on xarelto PTA. He is s/p fall with hip fracture and repair on 5/22 and and in now noted with post-op ileus > NPO.  IV heparin continues - heparin at goal on on 1450 units/hr. CBC stable. No bleeding noted.  Goal of Therapy:  Heparin level 0.3-0.7 units/ml Monitor platelets by anticoagulation protocol: Yes   Plan:  - Continue heparin at 1450 units/hr - Daily heparin level and cbc - Will continue to monitor for any signs/symptoms of bleeding   Thank you for allowing pharmacy to be a part of this patients care team.  Rowe Robert Pharm.D., BCPS, AQ-Cardiology Clinical Pharmacist 11/04/2014 3:25 PM Pager: [phone] Phone: [phone]

## 2014-11-04 NOTE — Progress Notes (Addendum)
[facility] PROGRESS NOTE Interim History: 79 y.o. male  has a past medical history of Diabetes mellitus type 2 in obese; Diabetic neuropathy; Hypertension; GERD  Gout; Left knee DJD; Right knee DJD; PONV  Bilateral hearing loss; Chronic renal insufficiency, stage III (moderate); Dysrhythmia; and On supplemental oxygen therapy. Presented with fall while at the grocery store, Patient lost balance and fell hitting his HEAD and left hip he was not able to get up. EMS was called and he arrived to ER. Plain imaging showed Intertochanteric fracture of the left hip. Patient with hx of A.fib on Xarelto last dose this AM and coreg. On arrival to ER he was tachycardic with HR up to 130 with A.fib he was given a dose of diltiazem and improved with HR down to 80's currently converted to sinus rhythm with heart rate of 70. Develop Ileus, NG tube place, GI consult Colonoscopy done showed small areas of ischemia. CT abd and pelvis showed improved iLeus, lactic acid 1.0.  HPI/Subjective: - sleeping this morning initially, feeling about the same  Assessment/Plan:  Ileus, postoperative - worsening, s/p decompression 6/3 - mobilize with PT - discontinued narcotics 6/2, pain controlled without  - Cont Reglan, continue IVF, erythromycin - Keep mag. > 2.0 and K >4.0. - repeat abdominal film with persistent gaseous distention of small bowel loops, significant bowel wall thickening of cecum and ascending colon  - diltiazem may be causing a prolonged ileus, I discussed with dr. Einar Gip this morning, will stop diltiazem and start Amiodarone - general surgery re-consulted by GI today   Atrial fibrillation with RVR: - alternates sinus with fib/flutter - amiodarone starting 6/6, stop cardizem - continue telemetry   Left closed hip fracture: - Orthopedic surgery was consulted, s/p trochanteric IM nailing performed on 10/18/2014. - Physical therapy was consulted which recommended skilled nursing  facility - Cont physical therapy  Intermittent nightly confusion - stable   Acute on Chronic kidney see stage III: - Most likely prerenal due to decreased oral intake. - Resume IV hydration not able to keep up with needs - Cr stable  Prostate cancer  Diabetes mellitus type 2 in obese: - decrease lantus - BG well controlled.  Essential  Hypertension - Bp stable.   Code Status: Full Code Family Communication: no family bedside this morning Disposition Plan: SNF when ileus better  Consultants:  GI  Cardiology   General surgery  Procedures:  abd x-ray  Colonoscopy 5.26.2016: Multiple short discrete areas of ischemia in the sigmoid descending and transverse with normal bowel in between an air and fluid suctioned as above and no other obvious findings in unprepped exam  CT abd and pelvis 5.26.2016: Decompressed stomach, small bowel and colon following NG tube  insertion and decompression colonoscopy.  Antibiotics:  None  Objective: Filed Vitals:   11/03/14 1200 11/03/14 1423 11/03/14 1953 11/04/14 0401  BP: 105/69 119/70 120/62 137/61  Pulse:  92 88 106  Temp:  98.1 F (36.7 C) 98.6 F (37 C) 98.1 F (36.7 C)  TempSrc:  Oral Oral Oral  Resp:  17 18 18   Height:      Weight:      SpO2:  96% 97% 91%    Intake/Output Summary (Last 24 hours) at 11/04/14 1100 Last data filed at 11/04/14 0930  Gross per 24 hour  Intake      0 ml  Output   3175 ml  Net  -3175 ml   Filed Weights   10/24/14 1442  Weight: 101.606  kg (224 lb)   Exam: General: NAD HEENT: No bruits, no goiter.  Heart: Regular rate and rhythm. Lungs: Good air movement,clear Abdomen: Soft, no tenderness, no bowel sounds.   Data Reviewed: Basic Metabolic Panel:  Recent Labs Lab 10/29/14 0329 10/30/14 0345 10/31/14 0448 11/02/14 0347 11/03/14 0600  NA 133* 133* 131* 132* 134*  K 4.7 5.1 4.9 3.9 3.4*  CL 102 102 101 99* 100*  CO2 25 23 21* 23 23  GLUCOSE 124* 132* 151* 142* 103*   BUN 18 13 15 20 17   CREATININE 1.27* 1.34* 1.60* 1.80* 1.55*  CALCIUM 8.9 9.0 9.1 8.8* 8.8*  MG  --   --   --  1.7  --   PHOS  --   --   --  3.6  --     CBC:  Recent Labs Lab 10/31/14 0448 11/01/14 0628 11/02/14 0347 11/03/14 0600 11/04/14 0427  WBC 18.5* 21.1* 17.3* 16.2* 14.7*  HGB 11.4* 10.5* 9.9* 9.7* 11.2*  HCT 33.9* 32.0* 29.4* 29.0* 33.9*  MCV 94.4 93.6 93.3 94.2 93.4  PLT 380 427* 390 385 432*    CBG:  Recent Labs Lab 11/03/14 1601 11/03/14 1950 11/04/14 0118 11/04/14 0320 11/04/14 0851  GLUCAP 98 108* 90 81 74    Recent Results (from the past 240 hour(s))  Clostridium Difficile by PCR     Status: None   Collection Time: 10/26/14  8:40 AM  Result Value Ref Range Status   C difficile by pcr NEGATIVE NEGATIVE Final     Studies: Dg Abd 2 Views  11/04/2014   CLINICAL DATA:  Abdominal distention.  EXAM: ABDOMEN - 2 VIEW  COMPARISON:  11/03/2014, 11/01/2014.  FINDINGS: NG tube tip below hemidiaphragms. Persistent prominent small bowel distention with a paucity of intra colonic gas. These findings are consistent with small bowel obstruction. No free air identified. Surgical clips in the pelvis. Postsurgical changes left hip. Degenerative changes lumbar spine right hip.  IMPRESSION: Persistent severely distended small bowel with paucity of intra colonic bowel gas. These findings are consistent with small bowel obstruction. NG tube tip noted below the hemidiaphragms.   Electronically Signed   By: Marcello Moores  Register   On: 11/04/2014 08:29   Dg Abd 2 Views  11/03/2014   CLINICAL DATA:  Abdominal distention, bowel obstruction, nasogastric tube, post LEFT hip surgery, history diabetes, hypertension, GERD  EXAM: ABDOMEN - 2 VIEW  COMPARISON:  11/01/2014  FINDINGS: Unknown position of tip of nasogastric tube, question coiled within a distended stomach versus beyond ligament of Treitz in proximal jejunum.  Persistent significant gaseous distention of small bowel loops.  Paucity  of colonic gas.  Significant bowel wall thickening of the cecum and ascending colon high suspicious for colitis.  No free intraperitoneal air.  Mild distention of the urinary bladder.  Catheter projects over inferior bladder.  BILATERAL pelvic surgical clips.  Bones demineralized with orthopedic harbor noted at the proximal LEFT femur.  IMPRESSION: Significant bowel wall thickening of cecum and ascending colon highly suspicious for colitis.  Persistent gaseous distention of small bowel loops which could be related to obstruction or ileus.   Electronically Signed   By: Lavonia Dana M.D.   On: 11/03/2014 10:47    Scheduled Meds: . allopurinol  300 mg Oral q morning - 10a  . erythromycin  250 mg Intravenous 3 times per day  . insulin glargine  20 Units Subcutaneous QHS  . metoCLOPramide (REGLAN) injection  10 mg Intravenous 4 times per day  .  polyethylene glycol  17 g Oral QID  . rosuvastatin  10 mg Oral QPM   Continuous Infusions: . sodium chloride 50 mL/hr at 10/31/14 1034  . amiodarone 60 mg/hr (11/04/14 1058)   Followed by  . amiodarone    . dextrose 5 % and 0.45% NaCl 50 mL/hr at 11/04/14 1040  . heparin 1,450 Units/hr (11/04/14 0636)   Time spent: 25 minutes  Marzetta Board  [facility] Pager [phone]. If 7PM-7AM, please contact night-coverage at [email or web address], password [facility] 11/04/2014, 11:00 AM  LOS: 16 days

## 2014-11-05 ENCOUNTER — Inpatient Hospital Stay (HOSPITAL_COMMUNITY): Payer: Medicare Other

## 2014-11-05 LAB — HEPARIN LEVEL (UNFRACTIONATED): HEPARIN UNFRACTIONATED: 0.49 [IU]/mL (ref 0.30–0.70)

## 2014-11-05 LAB — CBC
HCT: 30.8 % — ABNORMAL LOW (ref 39.0–52.0)
Hemoglobin: 10.2 g/dL — ABNORMAL LOW (ref 13.0–17.0)
MCH: 30.4 pg (ref 26.0–34.0)
MCHC: 33.1 g/dL (ref 30.0–36.0)
MCV: 91.9 fL (ref 78.0–100.0)
PLATELETS: 392 10*3/uL (ref 150–400)
RBC: 3.35 MIL/uL — AB (ref 4.22–5.81)
RDW: 15.1 % (ref 11.5–15.5)
WBC: 13.6 10*3/uL — ABNORMAL HIGH (ref 4.0–10.5)

## 2014-11-05 LAB — GLUCOSE, CAPILLARY
GLUCOSE-CAPILLARY: 104 mg/dL — AB (ref 65–99)
GLUCOSE-CAPILLARY: 127 mg/dL — AB (ref 65–99)
GLUCOSE-CAPILLARY: 93 mg/dL (ref 65–99)
GLUCOSE-CAPILLARY: 95 mg/dL (ref 65–99)
Glucose-Capillary: 98 mg/dL (ref 65–99)
Glucose-Capillary: 92 mg/dL (ref 65–99)

## 2014-11-05 LAB — BASIC METABOLIC PANEL
Anion gap: 11 (ref 5–15)
BUN: 11 mg/dL (ref 6–20)
CHLORIDE: 100 mmol/L — AB (ref 101–111)
CO2: 21 mmol/L — AB (ref 22–32)
Calcium: 8.8 mg/dL — ABNORMAL LOW (ref 8.9–10.3)
Creatinine, Ser: 1.37 mg/dL — ABNORMAL HIGH (ref 0.61–1.24)
GFR calc Af Amer: 56 mL/min — ABNORMAL LOW (ref >60)
GFR, EST NON AFRICAN AMERICAN: 48 mL/min — AB (ref >60)
GLUCOSE: 87 mg/dL (ref 65–99)
Potassium: 3.2 mmol/L — ABNORMAL LOW (ref 3.5–5.1)
Sodium: 132 mmol/L — ABNORMAL LOW (ref 135–145)

## 2014-11-05 LAB — LIPASE, BLOOD: Lipase: 28 U/L (ref 22–51)

## 2014-11-05 LAB — MAGNESIUM: MAGNESIUM: 1.6 mg/dL — AB (ref 1.7–2.4)

## 2014-11-05 LAB — AMYLASE: Amylase: 39 U/L (ref 28–100)

## 2014-11-05 LAB — LACTIC ACID, PLASMA: Lactic Acid, Venous: 0.7 mmol/L (ref 0.5–2.0)

## 2014-11-05 MED ORDER — POTASSIUM CHLORIDE 10 MEQ/100ML IV SOLN
10.0000 meq | INTRAVENOUS | Status: DC
  Filled 2014-11-05 (×4): qty 100

## 2014-11-05 MED ORDER — MAGNESIUM SULFATE 4 GM/100ML IV SOLN
4.0000 g | Freq: Once | INTRAVENOUS | Status: AC
  Administered 2014-11-05: 4 g via INTRAVENOUS
  Filled 2014-11-05: qty 100

## 2014-11-05 MED ORDER — POTASSIUM CHLORIDE 10 MEQ/100ML IV SOLN
10.0000 meq | INTRAVENOUS | Status: AC
  Administered 2014-11-05 (×4): 10 meq via INTRAVENOUS
  Filled 2014-11-05 (×8): qty 100

## 2014-11-05 NOTE — Progress Notes (Signed)
Erythromycin dose rescheduled to 0800 per pharmacy due to delay in dose administration this morning.  Raliegh Ip RN

## 2014-11-05 NOTE — Progress Notes (Signed)
4 Days Post-Op  Subjective: Pt states he passed some flatus overnight but no BM. CT scan results noted.  Objective: Vital signs in last 24 hours: Temp:  [98 F (36.7 C)-99.1 F (37.3 C)] 98.1 F (36.7 C) (06/07 0416) Pulse Rate:  [90-95] 91 (06/07 0416) Resp:  [17-18] 18 (06/07 0416) BP: (125-146)/(65-77) 146/77 mmHg (06/07 0416) SpO2:  [94 %-95 %] 95 % (06/07 0416) Weight:  [96.389 kg (212 lb 8 oz)] 96.389 kg (212 lb 8 oz) (06/06 1600) Last BM Date: 11/02/14  Intake/Output from previous day: 06/06 0701 - 06/07 0700 In: 0  Out: 2030 [Urine:350; Emesis/NG output:480; Stool:1200] Intake/Output this shift:    General appearance: alert and cooperative GI: soft, min dist, nttp, no rebound/guarding, hypoactiveBS  Lab Results:   Recent Labs  11/04/14 0427 11/05/14 0436  WBC 14.7* 13.6*  HGB 11.2* 10.2*  HCT 33.9* 30.8*  PLT 432* 392   BMET  Recent Labs  11/04/14 1425 11/05/14 0436  NA 129* 132*  K 2.8* 3.2*  CL 93* 100*  CO2 20* 21*  GLUCOSE 376* 87  BUN 14 11  CREATININE 1.32* 1.37*  CALCIUM 8.4* 8.8*   PT/INR No results for input(s): LABPROT, INR in the last 72 hours. ABG No results for input(s): PHART, HCO3 in the last 72 hours.  Invalid input(s): PCO2, PO2  Studies/Results: Ct Abdomen Pelvis W Contrast  11/04/2014   CLINICAL DATA:  79 year old male with history of small bowel obstruction or ileus following left hip surgery. Abdominal distention.  EXAM: CT ABDOMEN AND PELVIS WITH CONTRAST  TECHNIQUE: Multidetector CT imaging of the abdomen and pelvis was performed using the standard protocol following bolus administration of intravenous contrast.  CONTRAST:  120mL OMNIPAQUE IOHEXOL 300 MG/ML  SOLN  COMPARISON:  CT the abdomen and pelvis 10/24/2014.  FINDINGS: Lower chest: Small hiatal hernia. Nasogastric tube. Scattered linear opacities in the lungs bilaterally (right greater than left), compatible with areas of subsegmental atelectasis and/or scarring.   Hepatobiliary: No cystic or solid hepatic lesions. No intra or extrahepatic biliary ductal dilatation. Status post cholecystectomy.  Pancreas: No pancreatic mass. No pancreatic ductal dilatation. No pancreatic or peripancreatic fluid or inflammatory changes.  Spleen: Unremarkable.  Adrenals/Urinary Tract: 2.9 cm well-defined low-attenuation nonenhancing lesion in the lower pole of the left kidney is compatible with a simple cyst. Right kidney is normal in appearance. Bilateral adrenal glands are normal in appearance. No hydroureteronephrosis. Urinary bladder is normal in appearance.  Stomach/Bowel: Normal appearance of the stomach. Nasogastric tube extends into the third portion of the duodenum. The mid small bowel remains mildly dilated measuring up to 4.5 cm in diameter. There are multiple air-fluid levels noted. However, there is no discrete transition point identified, and the small bowel gradually transitions to a normal caliber, and ultimately completely decompressed distal small bowel. The colon is remarkable for several areas of colonic wall thickening, most notable in the proximal descending colon, distal transverse colon and ascending colon extending to the level of the hepatic flexure, suggestive of a colitis. Normal appendix.  Vascular/Lymphatic: Atherosclerosis throughout the abdominal and pelvic vasculature, without evidence of aneurysm or dissection. No lymphadenopathy noted in the abdomen or pelvis.  Reproductive: Status post prostatectomy. Seminal vesicles are unremarkable in appearance.  Other: No significant volume of ascites.  No pneumoperitoneum.  Musculoskeletal: Status post ORIF in the left femoral neck traversing a femoral neck fracture. There are no aggressive appearing lytic or blastic lesions noted in the visualized portions of the skeleton.  IMPRESSION: 1. Although  there is recurrent dilatation of the mid small bowel with multiple air-fluid levels, the findings are favored to reflect a  regional small bowel ileus, as there is no transition point to suggest frank obstruction, and no dilatation of the more proximal small bowel (although there is a nasogastric tube in place). 2. Multifocal thickening of the colonic wall, suggestive of colitis. 3. Normal appendix. 4. Small hiatal hernia. 5. Atherosclerosis. 6. Additional incidental findings, as above.   Electronically Signed   By: Vinnie Langton M.D.   On: 11/04/2014 18:02   Dg Abd 2 Views  11/04/2014   CLINICAL DATA:  Abdominal distention.  EXAM: ABDOMEN - 2 VIEW  COMPARISON:  11/03/2014, 11/01/2014.  FINDINGS: NG tube tip below hemidiaphragms. Persistent prominent small bowel distention with a paucity of intra colonic gas. These findings are consistent with small bowel obstruction. No free air identified. Surgical clips in the pelvis. Postsurgical changes left hip. Degenerative changes lumbar spine right hip.  IMPRESSION: Persistent severely distended small bowel with paucity of intra colonic bowel gas. These findings are consistent with small bowel obstruction. NG tube tip noted below the hemidiaphragms.   Electronically Signed   By: Marcello Moores  Register   On: 11/04/2014 08:29   Dg Abd 2 Views  11/03/2014   CLINICAL DATA:  Abdominal distention, bowel obstruction, nasogastric tube, post LEFT hip surgery, history diabetes, hypertension, GERD  EXAM: ABDOMEN - 2 VIEW  COMPARISON:  11/01/2014  FINDINGS: Unknown position of tip of nasogastric tube, question coiled within a distended stomach versus beyond ligament of Treitz in proximal jejunum.  Persistent significant gaseous distention of small bowel loops.  Paucity of colonic gas.  Significant bowel wall thickening of the cecum and ascending colon high suspicious for colitis.  No free intraperitoneal air.  Mild distention of the urinary bladder.  Catheter projects over inferior bladder.  BILATERAL pelvic surgical clips.  Bones demineralized with orthopedic harbor noted at the proximal LEFT femur.   IMPRESSION: Significant bowel wall thickening of cecum and ascending colon highly suspicious for colitis.  Persistent gaseous distention of small bowel loops which could be related to obstruction or ileus.   Electronically Signed   By: Lavonia Dana M.D.   On: 11/03/2014 10:47    Anti-infectives: Anti-infectives    Start     Dose/Rate Route Frequency Ordered Stop   10/24/14 1400  erythromycin 250 mg in sodium chloride 0.9 % 100 mL IVPB     250 mg 100 mL/hr over 60 Minutes Intravenous 3 times per day 10/24/14 1239     10/23/14 1100  fluconazole (DIFLUCAN) IVPB 100 mg  Status:  Discontinued     100 mg 50 mL/hr over 60 Minutes Intravenous Every 24 hours 10/23/14 1043 11/01/14 0909   10/20/14 1230  ceFAZolin (ANCEF) IVPB 2 g/50 mL premix     2 g 100 mL/hr over 30 Minutes Intravenous Every 6 hours 10/20/14 1114 10/20/14 1912      Assessment/Plan:  S/p decompressive colonoscopy x2 Closed hip Fx s/p IM nail  PAF Abdominal pain, nausea, vomiting SB ileus CT A/P shows SB ileus. NPO, NGT to LWIS instead of continuous Aggressive Lytes supplementation May need to consider parenteral nutrition if he does not open up soon Would hold off on miralax  LOS: 17 days    Rosario Jacks., Anne Hahn 11/05/2014

## 2014-11-05 NOTE — Progress Notes (Signed)
CT negative for obstruction.  Drainage from NGT is decreasing.  Starting to pass flatus and some scant bowel movements.  Abdomen feels less distended.  Plan to keep NGT (change to LWIS), ice chips and repeat abdominal xray later this afternoon.  Will follow.

## 2014-11-05 NOTE — Care Management Note (Signed)
Case Management Note  Patient Details  Name: Howard Charles MRN: [id number] Date of Birth: 19-Jul-1935  Subjective/Objective:    Pt admitted with Closed Hip Fracture, s/p hip fracture repair pt developed ileus.                 Action/Plan:  Pt has undergone multiple decompression colonoscopies with minimal improvement.  CM will continue to monitor for disposition needs   Expected Discharge Date:                  Expected Discharge Plan:  [place]  In-House Referral:  Clinical Social Work  Discharge planning Services  CM Consult  Post Acute Care Choice:    Choice offered to:     DME Arranged:    DME Agency:     HH Arranged:    [place] Agency:     Status of Service:  In process, will continue to follow  Medicare Important Message Given: Yes Medicare IM give by: [name] Date Given: 11/01/14  Date Additional Medicare IM Given:  10/31/14 Additional Medicare Important Message give by:  [name]  If discussed at Long Length of Stay Meetings, dates discussed:  11/05/14  Additional Comments:  [name], RN 11/05/2014, 11:33 AM

## 2014-11-05 NOTE — Progress Notes (Addendum)
[facility] PROGRESS NOTE Interim History: 79 y.o. M with history of DM, HTN, GERD, DJD, A fib on Xarelto, was admitted on 5/21 after suffering a fall fall while at the grocery store. He lost balance and fell which resulted in left hip fracture. He was admitted and underwent hip repair on 10/18/2014. His post operative course has been complicated by A fib with RVR requiring cardizem drip and SDU transfer, and he has been alternating between A fib and sinus rhythm. Dr. Einar Gip with cardiology has been consulted and is following patient while hospitalized. Patient's cardizem was changed over to Amiodarone on 6/6. Postoperatively he developed also a severe ileus, which required decompression x 2, has been very slow to improve, thus his cardizem gtt was discontinued as it can contribute. Cardiology, GI and General Surgery are following.   HPI/Subjective: - no abdominal pain, passing gas - no chest pain, no shortness of breath   Assessment/Plan:  Ileus, postoperative - s/p decompression with rectal tube and NG on 5/25, subsequently improving, tubes were removed and his diet was advanced. His ileus started to get worse again and patient underwent a colonoscopy for decompression on 6/3 and rectal tube and NG. Currently his rectal tube has been removed as he appears to be improving and NG tube is still in.  - mobilize with PT - all his narcotics were discontinued on 6/2, pain controlled without - Cont Reglan, continue IVF, erythromycin - aggressive lytes repletion, Keep mag. > 2.0 and K >4.0. - repeat abdominal film with persistent gaseous distention of small bowel loops, significant bowel wall thickening of cecum and ascending colon  - diltiazem may be causing a prolonged ileus, I discussed with dr. Einar Gip, will stop diltiazem and start Amiodarone - general surgery and GI following - will likely need TPN soon if ileus not improving 24-48h  Atrial fibrillation with RVR: - alternates sinus  with fib/flutter - amiodarone started 6/6, stop cardizem - continue telemetry  - A fib with RVR intermittently this morning, patient asymptomatic  Left closed hip fracture: - Orthopedic surgery was consulted, s/p trochanteric IM nailing performed on 10/18/2014. - Physical therapy was consulted which recommended skilled nursing facility - Cont physical therapy  Intermittent nightly confusion - stable   Acute on Chronic kidney see stage III: - Most likely prerenal due to decreased oral intake. - Resume IV hydration not able to keep up with needs - Cr stable  Prostate cancer  Diabetes mellitus type 2 in obese: - lantus - BG well controlled.  Essential  Hypertension - Bp stable.   Code Status: Full Code Family Communication: discussed with his wife bedside Disposition Plan: SNF when ileus better  Consultants:  GI  Cardiology   General surgery  Procedures:  abd x-ray  Colonoscopy 5.26.2016: Multiple short discrete areas of ischemia in the sigmoid descending and transverse with normal bowel in between an air and fluid suctioned as above and no other obvious findings in unprepped exam  CT abd and pelvis 5.26.2016: Decompressed stomach, small bowel and colon following NG tube  insertion and decompression colonoscopy.  Antibiotics:  None  Objective: Filed Vitals:   11/04/14 1349 11/04/14 1600 11/04/14 2020 11/05/14 0416  BP: 125/65  139/70 146/77  Pulse: 95  90 91  Temp: 98 F (36.7 C)  99.1 F (37.3 C) 98.1 F (36.7 C)  TempSrc: Oral  Oral Oral  Resp: 17  18 18   Height:      Weight:  96.389 kg (212 lb 8 oz)  SpO2: 94%  95% 95%    Intake/Output Summary (Last 24 hours) at 11/05/14 0652 Last data filed at 11/04/14 1900  Gross per 24 hour  Intake      0 ml  Output   2030 ml  Net  -2030 ml   Filed Weights   10/24/14 1442 11/04/14 1600  Weight: 101.606 kg (224 lb) 96.389 kg (212 lb 8 oz)   Exam: General: NAD HEENT: No bruits, no goiter.  Heart:  Regular rate and rhythm. Lungs: Good air movement,clear Abdomen: Soft, no tenderness, no bowel sounds.  Skin: no rashes Neuro: non focal, alert and oriented x 3 Psych: normal mood and affect  Data Reviewed: Basic Metabolic Panel:  Recent Labs Lab 10/31/14 0448 11/02/14 0347 11/03/14 0600 11/04/14 1425 11/05/14 0436  NA 131* 132* 134* 129* 132*  K 4.9 3.9 3.4* 2.8* 3.2*  CL 101 99* 100* 93* 100*  CO2 21* 23 23 20* 21*  GLUCOSE 151* 142* 103* 376* 87  BUN 15 20 17 14 11   CREATININE 1.60* 1.80* 1.55* 1.32* 1.37*  CALCIUM 9.1 8.8* 8.8* 8.4* 8.8*  MG  --  1.7  --  1.6*  --   PHOS  --  3.6  --   --   --     CBC:  Recent Labs Lab 11/01/14 0628 11/02/14 0347 11/03/14 0600 11/04/14 0427 11/05/14 0436  WBC 21.1* 17.3* 16.2* 14.7* 13.6*  HGB 10.5* 9.9* 9.7* 11.2* 10.2*  HCT 32.0* 29.4* 29.0* 33.9* 30.8*  MCV 93.6 93.3 94.2 93.4 91.9  PLT 427* 390 385 432* 392    CBG:  Recent Labs Lab 11/04/14 1137 11/04/14 1600 11/04/14 1957 11/05/14 0051 11/05/14 0414  GLUCAP 137* 115* 120* 104* 95    Recent Results (from the past 240 hour(s))  Clostridium Difficile by PCR     Status: None   Collection Time: 10/26/14  8:40 AM  Result Value Ref Range Status   C difficile by pcr NEGATIVE NEGATIVE Final    Studies: Ct Abdomen Pelvis W Contrast 11/04/2014 1. Although there is recurrent dilatation of the mid small bowel with multiple air-fluid levels, the findings are favored to reflect a regional small bowel ileus, as there is no transition point to suggest frank obstruction, and no dilatation of the more proximal small bowel (although there is a nasogastric tube in place). 2. Multifocal thickening of the colonic wall, suggestive of colitis. 3. Normal appendix. 4. Small hiatal hernia. 5. Atherosclerosis. 6. Additional incidental findings, as above.   Dg Abd 2 Views 11/04/2014 Persistent severely distended small bowel with paucity of intra colonic bowel gas. These findings are  consistent with small bowel obstruction. NG tube tip noted below the hemidiaphragms.    Dg Abd 2 Views 11/03/2014 Significant bowel wall thickening of cecum and ascending colon highly suspicious for colitis.  Persistent gaseous distention of small bowel loops which could be related to obstruction or ileus.    Scheduled Meds: . allopurinol  300 mg Oral q morning - 10a  . erythromycin  250 mg Intravenous 3 times per day  . insulin glargine  20 Units Subcutaneous QHS  . metoCLOPramide (REGLAN) injection  10 mg Intravenous 4 times per day  . polyethylene glycol  17 g Oral QID  . potassium chloride  10 mEq Intravenous Q1 Hr x 4  . rosuvastatin  10 mg Oral QPM   Continuous Infusions: . sodium chloride 50 mL/hr at 10/31/14 1034  . amiodarone 30 mg/hr (11/04/14 2226)  . dextrose 5 %  and 0.45% NaCl 50 mL/hr at 11/04/14 1040  . heparin 1,450 Units/hr (11/04/14 2124)   Time spent: 25 minutes  Marzetta Board  [facility] Pager [phone]. If 7PM-7AM, please contact night-coverage at [email or web address], password [facility] 11/05/2014, 6:52 AM  LOS: 17 days

## 2014-11-05 NOTE — Progress Notes (Signed)
Occupational Therapy Treatment Patient Details Name: ASKIA HAZELIP MRN: [id number] DOB: 1936/01/26 Today's Date: 11/05/2014    History of present illness Pt is a 79 y.o. M s/p fall and L hip fx.  Pt's PMH includes DM, diabetic neuropathy, gout, HTN, B TKA, B hearing loss, chronic renal insufficiency, on supplemental O2 at night, a fib, rotator cuff arthroscopy.   OT comments  Pt limited due to increased HR (up to 150s in session) sitting EOB.   Follow Up Recommendations  SNF;Supervision/Assistance - 24 hour    Equipment Recommendations  None recommended by OT    Recommendations for Other Services      Precautions / Restrictions Precautions Precautions: Fall Precaution Comments: NG tube Restrictions Weight Bearing Restrictions: Yes LLE Weight Bearing: Weight bearing as tolerated       Mobility Bed Mobility Overal bed mobility: Needs Assistance Bed Mobility: Rolling;Sidelying to Sit;Sit to Sidelying Rolling: Min assist;Mod assist;Min guard Sidelying to sit: Max assist     Sit to sidelying: Min assist General bed mobility comments: Cues and assist for technique.  Transfers                 General transfer comment: not assessed    Balance  No physical assist needed once positioned well EOB.                                 ADL Overall ADL's : Needs assistance/impaired     Grooming: Wash/dry face;Applying deodorant;Sitting;Total assistance   Upper Body Bathing: Total assistance;Sitting Upper Body Bathing Details (indicate cue type and reason): OT washed armpits Lower Body Bathing: Bed level;Sit to/from stand;Moderate assistance (washed bottom and peri area )                         General ADL Comments: Pt sat EOB and not very motivated to perform ADLs. He did wash peri area and a little of buttocks. Wife washed pt's face and applied deodorant. OT washed pt's armpits. Explained it was beneficial to try to participate in ADLs.       Vision                     Perception     Praxis      Cognition  Awake/Alert Behavior During Therapy: WFL for tasks assessed/performed Overall Cognitive Status: History of cognitive impairments - at baseline                       Extremity/Trunk Assessment               Exercises     Shoulder Instructions       General Comments      Pertinent Vitals/ Pain       Pain Assessment: No/denies pain (at end of session-reported pain on face when wife was washing it)  HR up to 150s in session with little activity. Rehab tech spoke with MD and he informed for therapy to let him rest.   Home Living                                          Prior Functioning/Environment              Frequency Min 2X/week     Progress  Toward Goals  OT Goals(current goals can now be found in the care plan section)  Progress towards OT goals:  (updated goals-due today)  Acute Rehab OT Goals Patient Stated Goal: not stated OT Goal Formulation: With patient Time For Goal Achievement: 11/12/14 Potential to Achieve Goals: Good ADL Goals Pt Will Perform Lower Body Bathing: with max assist;sit to/from stand;with adaptive equipment Pt Will Perform Lower Body Dressing: with max assist;with adaptive equipment;sit to/from stand Pt Will Transfer to Toilet: with max assist;stand pivot transfer Additional ADL Goal #2: Pt will perform bed mobility at [place] assist level as precursor for ADLs. Additional ADL Goal #3: Pt will indepenently perform UE exercise to increase strength in bilateral UEs.  Plan Discharge plan remains appropriate    Co-evaluation                 End of Session     Activity Tolerance Other (comment) (increased HR)   Patient Left in bed;with call bell/phone within reach;with family/visitor present   Nurse Communication Other (comment) (increased HR)        Time: 7543-6067 OT Time Calculation (min): 18 min  Charges: OT  General Charges $OT Visit: 1 Procedure OT Treatments $Self Care/Home Management : 8-22 mins  Benito Mccreedy OTR/L [phone] 11/05/2014, 10:55 AM

## 2014-11-05 NOTE — Progress Notes (Signed)
ANTICOAGULATION CONSULT NOTE - Follow Up Consult  Pharmacy Consult for Heparin Indication: atrial fibrillation  No Known Allergies  Patient Measurements: Height: 5\' 10"  (177.8 cm) Weight: 212 lb 8 oz (96.389 kg) (wife request) IBW/kg (Calculated) : 73 Heparin Dosing Weight: 94 kg  Vital Signs: Temp: 98.1 F (36.7 C) (06/07 0416) Temp Source: Oral (06/07 0416) BP: 146/77 mmHg (06/07 0416) Pulse Rate: 91 (06/07 0416)  Labs:  Recent Labs  11/03/14 0600 11/04/14 0427 11/04/14 1425 11/05/14 0436  HGB 9.7* 11.2*  --  10.2*  HCT 29.0* 33.9*  --  30.8*  PLT 385 432*  --  392  HEPARINUNFRC 0.40 0.29* 0.50 0.49  CREATININE 1.55*  --  1.32* 1.37*    Estimated Creatinine Clearance: 52.6 mL/min (by C-G formula based on Cr of 1.37).  Assessment: 79 yo male with hx afib on xarelto PTA. He is s/p fall with hip fracture and repair on 5/22 and and in now noted with post-op ileus > NPO.  IV heparin continues - heparin at goal on on 1450 units/hr. CBC stable. No bleeding noted.  Goal of Therapy:  Heparin level 0.3-0.7 units/ml Monitor platelets by anticoagulation protocol: Yes   Plan:  - Continue heparin at 1450 units/hr - Daily heparin level and cbc - Will continue to monitor for any signs/symptoms of bleeding   Thank you for allowing pharmacy to be a part of this patients care team.  Rowe Robert Pharm.D., BCPS, AQ-Cardiology Clinical Pharmacist 11/05/2014 8:25 AM Pager: [phone] Phone: [phone]

## 2014-11-06 ENCOUNTER — Inpatient Hospital Stay (HOSPITAL_COMMUNITY): Payer: Medicare Other

## 2014-11-06 DIAGNOSIS — E669 Obesity, unspecified: Secondary | ICD-10-CM

## 2014-11-06 DIAGNOSIS — S72002A Fracture of unspecified part of neck of left femur, initial encounter for closed fracture: Secondary | ICD-10-CM

## 2014-11-06 DIAGNOSIS — K913 Postprocedural intestinal obstruction: Secondary | ICD-10-CM

## 2014-11-06 DIAGNOSIS — I4891 Unspecified atrial fibrillation: Secondary | ICD-10-CM

## 2014-11-06 DIAGNOSIS — N189 Chronic kidney disease, unspecified: Secondary | ICD-10-CM

## 2014-11-06 DIAGNOSIS — E119 Type 2 diabetes mellitus without complications: Secondary | ICD-10-CM

## 2014-11-06 LAB — GLUCOSE, CAPILLARY
GLUCOSE-CAPILLARY: 105 mg/dL — AB (ref 65–99)
GLUCOSE-CAPILLARY: 127 mg/dL — AB (ref 65–99)
Glucose-Capillary: 107 mg/dL — ABNORMAL HIGH (ref 65–99)
Glucose-Capillary: 111 mg/dL — ABNORMAL HIGH (ref 65–99)
Glucose-Capillary: 125 mg/dL — ABNORMAL HIGH (ref 65–99)
Glucose-Capillary: 133 mg/dL — ABNORMAL HIGH (ref 65–99)

## 2014-11-06 LAB — CBC
HEMATOCRIT: 31.2 % — AB (ref 39.0–52.0)
HEMOGLOBIN: 10.6 g/dL — AB (ref 13.0–17.0)
MCH: 31 pg (ref 26.0–34.0)
MCHC: 34 g/dL (ref 30.0–36.0)
MCV: 91.2 fL (ref 78.0–100.0)
Platelets: 375 10*3/uL (ref 150–400)
RBC: 3.42 MIL/uL — ABNORMAL LOW (ref 4.22–5.81)
RDW: 15 % (ref 11.5–15.5)
WBC: 10.9 10*3/uL — AB (ref 4.0–10.5)

## 2014-11-06 LAB — COMPREHENSIVE METABOLIC PANEL
ALT: 48 U/L (ref 17–63)
ANION GAP: 11 (ref 5–15)
AST: 31 U/L (ref 15–41)
Albumin: 2.2 g/dL — ABNORMAL LOW (ref 3.5–5.0)
Alkaline Phosphatase: 129 U/L — ABNORMAL HIGH (ref 38–126)
BILIRUBIN TOTAL: 1 mg/dL (ref 0.3–1.2)
BUN: 12 mg/dL (ref 6–20)
CHLORIDE: 101 mmol/L (ref 101–111)
CO2: 21 mmol/L — ABNORMAL LOW (ref 22–32)
CREATININE: 1.3 mg/dL — AB (ref 0.61–1.24)
Calcium: 8.5 mg/dL — ABNORMAL LOW (ref 8.9–10.3)
GFR calc Af Amer: 59 mL/min — ABNORMAL LOW (ref >60)
GFR calc non Af Amer: 51 mL/min — ABNORMAL LOW (ref >60)
GLUCOSE: 115 mg/dL — AB (ref 65–99)
Potassium: 3.1 mmol/L — ABNORMAL LOW (ref 3.5–5.1)
Sodium: 133 mmol/L — ABNORMAL LOW (ref 135–145)
Total Protein: 5 g/dL — ABNORMAL LOW (ref 6.5–8.1)

## 2014-11-06 LAB — MAGNESIUM: Magnesium: 2 mg/dL (ref 1.7–2.4)

## 2014-11-06 LAB — PHOSPHORUS: Phosphorus: 3.2 mg/dL (ref 2.5–4.6)

## 2014-11-06 LAB — HEPARIN LEVEL (UNFRACTIONATED): Heparin Unfractionated: 0.44 IU/mL (ref 0.30–0.70)

## 2014-11-06 MED ORDER — POTASSIUM CHLORIDE 10 MEQ/100ML IV SOLN
10.0000 meq | INTRAVENOUS | Status: AC
  Administered 2014-11-06 (×6): 10 meq via INTRAVENOUS
  Filled 2014-11-06 (×6): qty 100

## 2014-11-06 MED ORDER — INSULIN GLARGINE 100 UNIT/ML ~~LOC~~ SOLN: 20.0000 [IU] | Freq: Every day | SUBCUTANEOUS | Status: DC

## 2014-11-06 NOTE — Progress Notes (Signed)
Patient ID: Howard Charles, male   DOB: 02/04/1936, 79 y.o.   MRN: [id number]     [facility] [address] [place], [place] [address]    Phone: [phone] FAX: [phone]     Subjective: Passing flatus. No n/v.  Had a BM.  Little mobilization.    Objective:  Vital signs:  Filed Vitals:   11/05/14 0416 11/05/14 1428 11/05/14 1957 11/06/14 0416  BP: 146/77 145/71 145/72 146/88  Pulse: 91 89 92 94  Temp: 98.1 F (36.7 C) 97.5 F (36.4 C) 98.5 F (36.9 C) 98.3 F (36.8 C)  TempSrc: Oral Oral Oral Oral  Resp: 18 18 18 18   Height:      Weight:      SpO2: 95% 94% 95% 100%    Last BM Date: 11/02/14  Intake/Output   Yesterday:  06/07 0701 - 06/08 0700 In: 0  Out: 1125 [Urine:550; Emesis/NG output:575] This shift: I/O last 3 completed shifts: In: 0  Out: 1125 [Urine:550; Emesis/NG output:575]    Physical Exam: General: Pt awake/alert/oriented x4 in  no acute distress  Abdomen: Soft.  Nondistended.  Non distended.  Reducible umbilical hernia.   No evidence of peritonitis.  No incarcerated hernias.    Problem List:   Principal Problem:   Closed left hip fracture Active Problems:   Alzheimer's disease   Diabetes mellitus type 2 in obese   Hypertension   Chronic renal insufficiency, stage III (moderate)   A-fib   Prostate cancer   Atrial fibrillation with RVR   Ileus, postoperative   Ileus    Results:   Labs: Results for orders placed or performed during the hospital encounter of 10/19/14 (from the past 48 hour(s))  Glucose, capillary     Status: None   Collection Time: 11/04/14  8:51 AM  Result Value Ref Range   Glucose-Capillary 74 65 - 99 mg/dL   Comment 1 Notify RN   Glucose, capillary     Status: Abnormal   Collection Time: 11/04/14 11:37 AM  Result Value Ref Range   Glucose-Capillary 137 (H) 65 - 99 mg/dL   Comment 1 Notify RN   Heparin level (unfractionated)     Status: None    Collection Time: 11/04/14  2:25 PM  Result Value Ref Range   Heparin Unfractionated 0.50 0.30 - 0.70 IU/mL    Comment:        IF HEPARIN RESULTS ARE BELOW EXPECTED VALUES, AND PATIENT DOSAGE HAS BEEN CONFIRMED, SUGGEST FOLLOW UP TESTING OF ANTITHROMBIN III LEVELS.   Basic metabolic panel     Status: Abnormal   Collection Time: 11/04/14  2:25 PM  Result Value Ref Range   Sodium 129 (L) 135 - 145 mmol/L   Potassium 2.8 (L) 3.5 - 5.1 mmol/L   Chloride 93 (L) 101 - 111 mmol/L   CO2 20 (L) 22 - 32 mmol/L   Glucose, Bld 376 (H) 65 - 99 mg/dL   BUN 14 6 - 20 mg/dL   Creatinine, Ser 1.32 (H) 0.61 - 1.24 mg/dL   Calcium 8.4 (L) 8.9 - 10.3 mg/dL   GFR calc non Af Amer 50 (L) >60 mL/min   GFR calc Af Amer 58 (L) >60 mL/min    Comment: (NOTE) The eGFR has been calculated using the CKD EPI equation. This calculation has not been validated in all clinical situations. eGFR's persistently <60 mL/min signify possible Chronic Kidney Disease.    Anion gap  16 (H) 5 - 15  Magnesium     Status: Abnormal   Collection Time: 11/04/14  2:25 PM  Result Value Ref Range   Magnesium 1.6 (L) 1.7 - 2.4 mg/dL  Glucose, capillary     Status: Abnormal   Collection Time: 11/04/14  4:00 PM  Result Value Ref Range   Glucose-Capillary 115 (H) 65 - 99 mg/dL   Comment 1 Notify RN    Comment 2 Document in Chart   Glucose, capillary     Status: Abnormal   Collection Time: 11/04/14  7:57 PM  Result Value Ref Range   Glucose-Capillary 120 (H) 65 - 99 mg/dL   Comment 1 Notify RN    Comment 2 Document in Chart   Glucose, capillary     Status: Abnormal   Collection Time: 11/05/14 12:51 AM  Result Value Ref Range   Glucose-Capillary 104 (H) 65 - 99 mg/dL  Glucose, capillary     Status: None   Collection Time: 11/05/14  4:14 AM  Result Value Ref Range   Glucose-Capillary 95 65 - 99 mg/dL   Comment 1 Notify RN    Comment 2 Document in Chart   Heparin level (unfractionated)     Status: None   Collection Time:  11/05/14  4:36 AM  Result Value Ref Range   Heparin Unfractionated 0.49 0.30 - 0.70 IU/mL    Comment:        IF HEPARIN RESULTS ARE BELOW EXPECTED VALUES, AND PATIENT DOSAGE HAS BEEN CONFIRMED, SUGGEST FOLLOW UP TESTING OF ANTITHROMBIN III LEVELS.   CBC     Status: Abnormal   Collection Time: 11/05/14  4:36 AM  Result Value Ref Range   WBC 13.6 (H) 4.0 - 10.5 K/uL   RBC 3.35 (L) 4.22 - 5.81 MIL/uL   Hemoglobin 10.2 (L) 13.0 - 17.0 g/dL   HCT 30.8 (L) 39.0 - 52.0 %   MCV 91.9 78.0 - 100.0 fL   MCH 30.4 26.0 - 34.0 pg   MCHC 33.1 30.0 - 36.0 g/dL   RDW 15.1 11.5 - 15.5 %   Platelets 392 150 - 400 K/uL  Basic metabolic panel     Status: Abnormal   Collection Time: 11/05/14  4:36 AM  Result Value Ref Range   Sodium 132 (L) 135 - 145 mmol/L   Potassium 3.2 (L) 3.5 - 5.1 mmol/L   Chloride 100 (L) 101 - 111 mmol/L   CO2 21 (L) 22 - 32 mmol/L   Glucose, Bld 87 65 - 99 mg/dL   BUN 11 6 - 20 mg/dL   Creatinine, Ser 1.37 (H) 0.61 - 1.24 mg/dL   Calcium 8.8 (L) 8.9 - 10.3 mg/dL   GFR calc non Af Amer 48 (L) >60 mL/min   GFR calc Af Amer 56 (L) >60 mL/min    Comment: (NOTE) The eGFR has been calculated using the CKD EPI equation. This calculation has not been validated in all clinical situations. eGFR's persistently <60 mL/min signify possible Chronic Kidney Disease.    Anion gap 11 5 - 15  Magnesium     Status: Abnormal   Collection Time: 11/05/14  4:36 AM  Result Value Ref Range   Magnesium 1.6 (L) 1.7 - 2.4 mg/dL  Glucose, capillary     Status: Abnormal   Collection Time: 11/05/14  8:16 AM  Result Value Ref Range   Glucose-Capillary 127 (H) 65 - 99 mg/dL   Comment 1 Notify RN   Lactic acid, plasma  Status: None   Collection Time: 11/05/14  9:55 AM  Result Value Ref Range   Lactic Acid, Venous 0.7 0.5 - 2.0 mmol/L  Lipase, blood     Status: None   Collection Time: 11/05/14  9:55 AM  Result Value Ref Range   Lipase 28 22 - 51 U/L  Amylase     Status: None    Collection Time: 11/05/14  9:55 AM  Result Value Ref Range   Amylase 39 28 - 100 U/L  Glucose, capillary     Status: None   Collection Time: 11/05/14 11:39 AM  Result Value Ref Range   Glucose-Capillary 98 65 - 99 mg/dL   Comment 1 Notify RN   Glucose, capillary     Status: None   Collection Time: 11/05/14  4:01 PM  Result Value Ref Range   Glucose-Capillary 93 65 - 99 mg/dL   Comment 1 Notify RN    Comment 2 Document in Chart   Glucose, capillary     Status: None   Collection Time: 11/05/14  8:20 PM  Result Value Ref Range   Glucose-Capillary 92 65 - 99 mg/dL   Comment 1 Notify RN    Comment 2 Document in Chart   Glucose, capillary     Status: Abnormal   Collection Time: 11/06/14 12:19 AM  Result Value Ref Range   Glucose-Capillary 107 (H) 65 - 99 mg/dL  Glucose, capillary     Status: Abnormal   Collection Time: 11/06/14  4:01 AM  Result Value Ref Range   Glucose-Capillary 105 (H) 65 - 99 mg/dL  Heparin level (unfractionated)     Status: None   Collection Time: 11/06/14  5:00 AM  Result Value Ref Range   Heparin Unfractionated 0.44 0.30 - 0.70 IU/mL    Comment:        IF HEPARIN RESULTS ARE BELOW EXPECTED VALUES, AND PATIENT DOSAGE HAS BEEN CONFIRMED, SUGGEST FOLLOW UP TESTING OF ANTITHROMBIN III LEVELS.   CBC     Status: Abnormal   Collection Time: 11/06/14  5:00 AM  Result Value Ref Range   WBC 10.9 (H) 4.0 - 10.5 K/uL   RBC 3.42 (L) 4.22 - 5.81 MIL/uL   Hemoglobin 10.6 (L) 13.0 - 17.0 g/dL   HCT 31.2 (L) 39.0 - 52.0 %   MCV 91.2 78.0 - 100.0 fL   MCH 31.0 26.0 - 34.0 pg   MCHC 34.0 30.0 - 36.0 g/dL   RDW 15.0 11.5 - 15.5 %   Platelets 375 150 - 400 K/uL  Magnesium     Status: None   Collection Time: 11/06/14  5:00 AM  Result Value Ref Range   Magnesium 2.0 1.7 - 2.4 mg/dL  Comprehensive metabolic panel     Status: Abnormal   Collection Time: 11/06/14  5:00 AM  Result Value Ref Range   Sodium 133 (L) 135 - 145 mmol/L   Potassium 3.1 (L) 3.5 - 5.1 mmol/L    Chloride 101 101 - 111 mmol/L   CO2 21 (L) 22 - 32 mmol/L   Glucose, Bld 115 (H) 65 - 99 mg/dL   BUN 12 6 - 20 mg/dL   Creatinine, Ser 1.30 (H) 0.61 - 1.24 mg/dL   Calcium 8.5 (L) 8.9 - 10.3 mg/dL   Total Protein 5.0 (L) 6.5 - 8.1 g/dL   Albumin 2.2 (L) 3.5 - 5.0 g/dL   AST 31 15 - 41 U/L   ALT 48 17 - 63 U/L   Alkaline Phosphatase 129 (H)  38 - 126 U/L   Total Bilirubin 1.0 0.3 - 1.2 mg/dL   GFR calc non Af Amer 51 (L) >60 mL/min   GFR calc Af Amer 59 (L) >60 mL/min    Comment: (NOTE) The eGFR has been calculated using the CKD EPI equation. This calculation has not been validated in all clinical situations. eGFR's persistently <60 mL/min signify possible Chronic Kidney Disease.    Anion gap 11 5 - 15  Phosphorus     Status: None   Collection Time: 11/06/14  5:00 AM  Result Value Ref Range   Phosphorus 3.2 2.5 - 4.6 mg/dL    Imaging / Studies: Ct Abdomen Pelvis W Contrast  11/04/2014   CLINICAL DATA:  79 year old male with history of small bowel obstruction or ileus following left hip surgery. Abdominal distention.  EXAM: CT ABDOMEN AND PELVIS WITH CONTRAST  TECHNIQUE: Multidetector CT imaging of the abdomen and pelvis was performed using the standard protocol following bolus administration of intravenous contrast.  CONTRAST:  140m OMNIPAQUE IOHEXOL 300 MG/ML  SOLN  COMPARISON:  CT the abdomen and pelvis 10/24/2014.  FINDINGS: Lower chest: Small hiatal hernia. Nasogastric tube. Scattered linear opacities in the lungs bilaterally (right greater than left), compatible with areas of subsegmental atelectasis and/or scarring.  Hepatobiliary: No cystic or solid hepatic lesions. No intra or extrahepatic biliary ductal dilatation. Status post cholecystectomy.  Pancreas: No pancreatic mass. No pancreatic ductal dilatation. No pancreatic or peripancreatic fluid or inflammatory changes.  Spleen: Unremarkable.  Adrenals/Urinary Tract: 2.9 cm well-defined low-attenuation nonenhancing lesion in the  lower pole of the left kidney is compatible with a simple cyst. Right kidney is normal in appearance. Bilateral adrenal glands are normal in appearance. No hydroureteronephrosis. Urinary bladder is normal in appearance.  Stomach/Bowel: Normal appearance of the stomach. Nasogastric tube extends into the third portion of the duodenum. The mid small bowel remains mildly dilated measuring up to 4.5 cm in diameter. There are multiple air-fluid levels noted. However, there is no discrete transition point identified, and the small bowel gradually transitions to a normal caliber, and ultimately completely decompressed distal small bowel. The colon is remarkable for several areas of colonic wall thickening, most notable in the proximal descending colon, distal transverse colon and ascending colon extending to the level of the hepatic flexure, suggestive of a colitis. Normal appendix.  Vascular/Lymphatic: Atherosclerosis throughout the abdominal and pelvic vasculature, without evidence of aneurysm or dissection. No lymphadenopathy noted in the abdomen or pelvis.  Reproductive: Status post prostatectomy. Seminal vesicles are unremarkable in appearance.  Other: No significant volume of ascites.  No pneumoperitoneum.  Musculoskeletal: Status post ORIF in the left femoral neck traversing a femoral neck fracture. There are no aggressive appearing lytic or blastic lesions noted in the visualized portions of the skeleton.  IMPRESSION: 1. Although there is recurrent dilatation of the mid small bowel with multiple air-fluid levels, the findings are favored to reflect a regional small bowel ileus, as there is no transition point to suggest frank obstruction, and no dilatation of the more proximal small bowel (although there is a nasogastric tube in place). 2. Multifocal thickening of the colonic wall, suggestive of colitis. 3. Normal appendix. 4. Small hiatal hernia. 5. Atherosclerosis. 6. Additional incidental findings, as above.    Electronically Signed   By: DVinnie LangtonM.D.   On: 11/04/2014 18:02   Dg Abd 2 Views  11/05/2014   CLINICAL DATA:  Abdominal distention, postop left hip surgery  EXAM: ABDOMEN - 2 VIEW  COMPARISON:  CT abdomen pelvis of 11/04/2014  FINDINGS: Supine and erect views the abdomen show both large and small bowel gas without significant distension most consistent with postoperative ileus. No free air is seen on the left lateral decubitus of the abdomen. An NG tube tip overlies the region of the expected descending duodenal loop. Pinning of left hip fracture is present.  IMPRESSION: Probable mild ileus.  No definite bowel obstruction or free air.   Electronically Signed   By: Ivar Drape M.D.   On: 11/05/2014 14:07   Dg Abd 2 Views  11/04/2014   CLINICAL DATA:  Abdominal distention.  EXAM: ABDOMEN - 2 VIEW  COMPARISON:  11/03/2014, 11/01/2014.  FINDINGS: NG tube tip below hemidiaphragms. Persistent prominent small bowel distention with a paucity of intra colonic gas. These findings are consistent with small bowel obstruction. No free air identified. Surgical clips in the pelvis. Postsurgical changes left hip. Degenerative changes lumbar spine right hip.  IMPRESSION: Persistent severely distended small bowel with paucity of intra colonic bowel gas. These findings are consistent with small bowel obstruction. NG tube tip noted below the hemidiaphragms.   Electronically Signed   By: Marcello Moores  Register   On: 11/04/2014 08:29    Medications / Allergies:  Scheduled Meds: . allopurinol  300 mg Oral q morning - 10a  . erythromycin  250 mg Intravenous 3 times per day  . insulin glargine  20 Units Subcutaneous QHS  . metoCLOPramide (REGLAN) injection  10 mg Intravenous 4 times per day  . potassium chloride  10 mEq Intravenous Q1 Hr x 6  . rosuvastatin  10 mg Oral QPM   Continuous Infusions: . sodium chloride 50 mL/hr at 10/31/14 1034  . amiodarone 30 mg/hr (11/05/14 2311)  . dextrose 5 % and 0.45% NaCl 50 mL/hr  at 11/05/14 2047  . heparin 1,450 Units/hr (11/05/14 1427)   PRN Meds:.acetaminophen **OR** acetaminophen, alum & mag hydroxide-simeth, menthol-cetylpyridinium **OR** phenol, ondansetron **OR** ondansetron (ZOFRAN) IV, promethazine  Antibiotics: Anti-infectives    Start     Dose/Rate Route Frequency Ordered Stop   10/24/14 1400  erythromycin 250 mg in sodium chloride 0.9 % 100 mL IVPB     250 mg 100 mL/hr over 60 Minutes Intravenous 3 times per day 10/24/14 1239     10/23/14 1100  fluconazole (DIFLUCAN) IVPB 100 mg  Status:  Discontinued     100 mg 50 mL/hr over 60 Minutes Intravenous Every 24 hours 10/23/14 1043 11/01/14 0909   10/20/14 1230  ceFAZolin (ANCEF) IVPB 2 g/50 mL premix     2 g 100 mL/hr over 30 Minutes Intravenous Every 6 hours 10/20/14 1114 10/20/14 1912        Assessment/Plan S/p decompressive colonoscopy x2 Closed hip Fx s/p IM nail  PAF Abdominal pain, nausea, vomiting Hypokalemia  SB ileus  558m-ice chips yesterday, passing flatus, abdomen is soft.  I think it would be reasonable to start clamping trial and give him small sips from the floor.  If residuals remain <300, DC NGT and start clears, advance diet slowly.  K is 3.1 today, supplement.  Mobilize as tolerated to aid in recovery.  Keep K ~4 and Mg ~2.  Non surgical abdomen.  Surgery is signing off. Please do not hesitate to contact [facility] with further assistance.    EErby Pian ASt. Claire Regional Medical CenterSurgery Pager [phone] For consults and floor pages call [phone](7A-4:30P)  11/06/2014  8:14 AM

## 2014-11-06 NOTE — Progress Notes (Signed)
Physical Therapy Treatment Patient Details Name: Howard Charles MRN: [id number] DOB: 06/03/1935 Today's Date: 11/06/2014    History of Present Illness Pt is a 79 y.o. M s/p fall and L hip fx.  Pt's PMH includes DM, diabetic neuropathy, gout, HTN, B TKA, B hearing loss, chronic renal insufficiency, on supplemental O2 at night, a fib, rotator cuff arthroscopy.    PT Comments    Pt progressing towards physical therapy goals. Was able to progress ambulation in hall and achieved 79' with 1 seated rest break. Pt reports continual dizziness throughout mobility. HR increased to a max of 172bpm per telemetry box during gait training. With 1st seated rest break HR decreased back down into the 120's. At end of session HR jumping between 115-123bpm. Will continue to follow and progress as able per POC.   Follow Up Recommendations  SNF;Supervision for mobility/OOB     Equipment Recommendations  Rolling walker with 5" wheels    Recommendations for Other Services       Precautions / Restrictions Precautions Precautions: Fall Restrictions Weight Bearing Restrictions: Yes LLE Weight Bearing: Weight bearing as tolerated    Mobility  Bed Mobility Overal bed mobility: Needs Assistance Bed Mobility: Supine to Sit     Supine to sit: Mod assist     General bed mobility comments: Assist for LE movement and trunk stability as pt elevated to full sitting position. Pt required assist with the bed pad to complete scoot to EOB.   Transfers Overall transfer level: Needs assistance Equipment used: Rolling walker (2 wheeled) Transfers: Sit to/from Stand Sit to Stand: Min assist;Mod assist;+2 physical assistance         General transfer comment: Min +2 required from elevated surface at EOB. Mod +2 required for stand from low recliner chair.   Ambulation/Gait Ambulation/Gait assistance: Min guard;+2 safety/equipment Ambulation Distance (Feet): 75 Feet Assistive device: Rolling walker (2  wheeled) Gait Pattern/deviations: Step-through pattern;Decreased stride length;Trunk flexed Gait velocity: Decreased Gait velocity interpretation: Below normal speed for age/gender General Gait Details: Wife pushed chair behind while tech and therapist had hands-on with the patient as pt was dizzy and had not been able to ambulate the past 3 PT session attempts. Min guard required for safety. 1 seated rest break required.    Stairs            Wheelchair Mobility    Modified Rankin (Stroke Patients Only)       Balance Overall balance assessment: Needs assistance;History of Falls Sitting-balance support: Feet supported;No upper extremity supported Sitting balance-Leahy Scale: Fair     Standing balance support: Bilateral upper extremity supported;During functional activity Standing balance-Leahy Scale: Poor Standing balance comment: Requires UR support to maintain standing balance at this time.                     Cognition Arousal/Alertness: Awake/alert Behavior During Therapy: WFL for tasks assessed/performed Overall Cognitive Status: History of cognitive impairments - at baseline                      Exercises      General Comments General comments (skin integrity, edema, etc.): Discussed HEP progression of heel slides, SLR , abd/add, quad sets, glute sets, ankle pumps.       Pertinent Vitals/Pain Pain Assessment: Faces Faces Pain Scale: Hurts little more Pain Location: LLE with mobility Pain Descriptors / Indicators: Aching Pain Intervention(s): Limited activity within patient's tolerance;Monitored during session;Repositioned    Home Living  Prior Function            PT Goals (current goals can now be found in the care plan section) Acute Rehab PT Goals Patient Stated Goal: not stated PT Goal Formulation: With patient Time For Goal Achievement: 11/07/14 Potential to Achieve Goals: Good Progress towards PT  goals: Progressing toward goals    Frequency  Min 3X/week    PT Plan Current plan remains appropriate    Co-evaluation             End of Session Equipment Utilized During Treatment: Gait belt Activity Tolerance: Patient tolerated treatment well Patient left: in chair;with call bell/phone within reach;with family/visitor present     Time: 0825-0900 PT Time Calculation (min) (ACUTE ONLY): 35 min  Charges:  $Gait Training: 8-22 mins $Therapeutic Activity: 8-22 mins                    G Codes:      Rolinda Roan November 17, 2014, 9:11 AM   Rolinda Roan, PT, DPT Acute Rehabilitation Services Pager: [phone]

## 2014-11-06 NOTE — Progress Notes (Signed)
[facility] PROGRESS NOTE Interim History: 79 y.o. M with history of DM, HTN, GERD, DJD, A fib on Xarelto, was admitted on 5/21 after suffering a fall fall while at the grocery store. He lost balance and fell which resulted in left hip fracture. He was admitted and underwent hip repair on 10/18/2014. His post operative course has been complicated by A fib with RVR requiring cardizem drip and SDU transfer, and he has been alternating between A fib and sinus rhythm. Dr. Einar Gip with cardiology has been consulted and is following patient while hospitalized. Patient's cardizem was changed over to Amiodarone on 6/6. Postoperatively he developed also a severe ileus, which required decompression x 2, has been very slow to improve, thus his cardizem gtt was discontinued as it can contribute. Cardiology, GI and General Surgery are following.   HPI/Subjective: -Patient reporting passing some flatus, thinks he is doing a little better today. He denies abdominal pain. NG tube remains in place, currently nothing by mouth  Assessment/Plan:  Ileus, postoperative - s/p decompression with rectal tube and NG on 5/25, subsequently improving, tubes were removed and his diet was advanced. His ileus started to get worse again and patient underwent a colonoscopy for decompression on 6/3 and rectal tube and NG. Currently his rectal tube has been removed as he appears to be improving and NG tube is still in.  - mobilize with PT - all his narcotics were discontinued on 6/2, pain controlled without - Cont Reglan, continue IVF, erythromycin - aggressive lytes repletion, Keep mag. > 2.0 and K >4.0. - repeat abdominal film with persistent gaseous distention of small bowel loops, significant bowel wall thickening of cecum and ascending colon  -Patient thinks is feeling better. On physical exam had soft abdomen with presence of bowel sounds. NG tube remains in place. Gen. surgery recommending clamping trial and given  small sips of clears. -Replacing potassium  Atrial fibrillation with RVR: -Patient remains on amiodarone drip -Currently rate controlled with ventricular rates in the 90s -Currently on heparin drip  Left closed hip fracture: - Orthopedic surgery was consulted, s/p trochanteric IM nailing performed on 10/18/2014. - Physical therapy was consulted which recommended skilled nursing facility - Cont physical therapy  Cognitive impairment: -Patient having a few episodes of increase confusion mainly at nighttime -He is currently stable, would recommend mental status exam in the outpatient setting once acute issues have resolved  Acute on Chronic kidney see stage III: - Most likely prerenal due to decreased oral intake. - Resume IV hydration not able to keep up with needs - Cr stable  Prostate cancer  Diabetes mellitus type 2 in obese: -Continue Lantus 20 units subcutaneous at bedtime as blood sugars are well controlled  Essential  Hypertension - Bp stable.   Code Status: Full Code Family Communication: I spoke to his wife who was present at bedside Disposition Plan: SNF when ileus better  Consultants:  GI  Cardiology   General surgery  Procedures:  abd x-ray  Colonoscopy 5.26.2016: Multiple short discrete areas of ischemia in the sigmoid descending and transverse with normal bowel in between an air and fluid suctioned as above and no other obvious findings in unprepped exam  CT abd and pelvis 5.26.2016: Decompressed stomach, small bowel and colon following NG tube  insertion and decompression colonoscopy.  Antibiotics:  None  Objective: Filed Vitals:   11/05/14 1428 11/05/14 1957 11/06/14 0416 11/06/14 1416  BP: 145/71 145/72 146/88 147/80  Pulse: 89 92 94 91  Temp: 97.5  F (36.4 C) 98.5 F (36.9 C) 98.3 F (36.8 C) 98.2 F (36.8 C)  TempSrc: Oral Oral Oral Oral  Resp: 18 18 18 17   Height:      Weight:      SpO2: 94% 95% 100% 97%    Intake/Output  Summary (Last 24 hours) at 11/06/14 1446 Last data filed at 11/06/14 1300  Gross per 24 hour  Intake      0 ml  Output   1395 ml  Net  -1395 ml   Filed Weights   10/24/14 1442 11/04/14 1600  Weight: 101.606 kg (224 lb) 96.389 kg (212 lb 8 oz)   Exam: General: NAD HEENT: No bruits, no goiter.  Heart: Regular rate and rhythm. Lungs: Good air movement,clear Abdomen: Patient having a soft abdomen, bowel sounds were heard in all 4 quadrants, abdomen nondistended, nontender Skin: no rashes Neuro: non focal, alert and oriented x 3 Psych: normal mood and affect  Data Reviewed: Basic Metabolic Panel:  Recent Labs Lab 11/02/14 0347 11/03/14 0600 11/04/14 1425 11/05/14 0436 11/06/14 0500  NA 132* 134* 129* 132* 133*  K 3.9 3.4* 2.8* 3.2* 3.1*  CL 99* 100* 93* 100* 101  CO2 23 23 20* 21* 21*  GLUCOSE 142* 103* 376* 87 115*  BUN 20 17 14 11 12   CREATININE 1.80* 1.55* 1.32* 1.37* 1.30*  CALCIUM 8.8* 8.8* 8.4* 8.8* 8.5*  MG 1.7  --  1.6* 1.6* 2.0  PHOS 3.6  --   --   --  3.2    CBC:  Recent Labs Lab 11/02/14 0347 11/03/14 0600 11/04/14 0427 11/05/14 0436 11/06/14 0500  WBC 17.3* 16.2* 14.7* 13.6* 10.9*  HGB 9.9* 9.7* 11.2* 10.2* 10.6*  HCT 29.4* 29.0* 33.9* 30.8* 31.2*  MCV 93.3 94.2 93.4 91.9 91.2  PLT 390 385 432* 392 375    CBG:  Recent Labs Lab 11/05/14 2020 11/06/14 0019 11/06/14 0401 11/06/14 0810 11/06/14 1139  GLUCAP 92 107* 105* 111* 133*    No results found for this or any previous visit (from the past 240 hour(s)).  Studies: Ct Abdomen Pelvis W Contrast 11/04/2014 1. Although there is recurrent dilatation of the mid small bowel with multiple air-fluid levels, the findings are favored to reflect a regional small bowel ileus, as there is no transition point to suggest frank obstruction, and no dilatation of the more proximal small bowel (although there is a nasogastric tube in place). 2. Multifocal thickening of the colonic wall, suggestive of colitis.  3. Normal appendix. 4. Small hiatal hernia. 5. Atherosclerosis. 6. Additional incidental findings, as above.   Dg Abd 2 Views 11/04/2014 Persistent severely distended small bowel with paucity of intra colonic bowel gas. These findings are consistent with small bowel obstruction. NG tube tip noted below the hemidiaphragms.    Dg Abd 2 Views 11/03/2014 Significant bowel wall thickening of cecum and ascending colon highly suspicious for colitis.  Persistent gaseous distention of small bowel loops which could be related to obstruction or ileus.    Scheduled Meds: . allopurinol  300 mg Oral q morning - 10a  . erythromycin  250 mg Intravenous 3 times per day  . insulin glargine  20 Units Subcutaneous QHS  . metoCLOPramide (REGLAN) injection  10 mg Intravenous 4 times per day  . potassium chloride  10 mEq Intravenous Q1 Hr x 6  . rosuvastatin  10 mg Oral QPM   Continuous Infusions: . sodium chloride 50 mL/hr at 10/31/14 1034  . amiodarone 30 mg/hr (11/06/14  0943)  . dextrose 5 % and 0.45% NaCl 50 mL/hr at 11/06/14 0943  . heparin 1,450 Units/hr (11/05/14 1427)   Time spent: 25 minutes  Kelvin Cellar  [facility] Pager [phone]. If 7PM-7AM, please contact night-coverage at [email or web address], password [facility] 11/06/2014, 2:46 PM  LOS: 18 days

## 2014-11-06 NOTE — Progress Notes (Signed)
Patient states he is passing some flatus  Abdomen: Bowel sounds present, soft, nontender  Impression: Ileus  Plan: Continue NG suction and supportive care. Replace electrolytes as needed.

## 2014-11-06 NOTE — Care Management Note (Addendum)
Case Management Note  Patient Details  Name: Howard Charles MRN: [id number] Date of Birth: [birth year]  Subjective/Objective:    Pt admitted with Closed Hip Fracture, s/p hip fracture repair pt developed ileus.                 Action/Plan:  Pt has undergone multiple decompression colonoscopies with minimal improvement.  CM will continue to monitor for disposition needs   Expected Discharge Date:                  Expected Discharge Plan:  [place]  In-House Referral:  Clinical Social Work  Discharge planning Services  CM Consult  Post Acute Care Choice:    Choice offered to:     DME Arranged:    DME Agency:     HH Arranged:    [place] Agency:     Status of Service:  In process, will continue to follow  Medicare Important Message Given: Yes Medicare IM give by: [name] Date Given: 11/01/14  Date Additional Medicare IM Given:  11/06/14 Additional Medicare Important Message give by:  [name]  If discussed at Long Length of Stay Meetings, dates discussed:  11/05/14  Additional Comments:  [name], RN 11/06/2014, 5:24 PM

## 2014-11-06 NOTE — Progress Notes (Signed)
ANTICOAGULATION CONSULT NOTE - Follow Up Consult  Pharmacy Consult for Heparin Indication: atrial fibrillation  No Known Allergies  Patient Measurements: Height: 5\' 10"  (177.8 cm) Weight: 212 lb 8 oz (96.389 kg) (wife request) IBW/kg (Calculated) : 73 Heparin Dosing Weight: 94 kg  Vital Signs: Temp: 98.3 F (36.8 C) (06/08 0416) Temp Source: Oral (06/08 0416) BP: 146/88 mmHg (06/08 0416) Pulse Rate: 94 (06/08 0416)  Labs:  Recent Labs  11/04/14 0427 11/04/14 1425 11/05/14 0436 11/06/14 0500  HGB 11.2*  --  10.2* 10.6*  HCT 33.9*  --  30.8* 31.2*  PLT 432*  --  392 375  HEPARINUNFRC 0.29* 0.50 0.49 0.44  CREATININE  --  1.32* 1.37* 1.30*    Estimated Creatinine Clearance: 55.5 mL/min (by C-G formula based on Cr of 1.3).  Assessment: 79 yo male with hx afib on xarelto PTA. He is s/p fall with hip fracture and repair on 5/22 and and in now noted with post-op ileus > NPO.  IV heparin continues - heparin at goal on on 1450 units/hr. CBC stable. No bleeding noted.  Goal of Therapy:  Heparin level 0.3-0.7 units/ml Monitor platelets by anticoagulation protocol: Yes   Plan:  Continue heparin at 1450 units/hr Daily heparin level and cbc Will continue to monitor for any signs/symptoms of bleeding Will follow-up for restart of Xarelto  Thank you for allowing pharmacy to be a part of this patients care team.  Horton Chin, Pharm.D., BCPS Clinical Pharmacist Pager [phone] 11/06/2014 10:07 AM

## 2014-11-07 DIAGNOSIS — G309 Alzheimer's disease, unspecified: Secondary | ICD-10-CM

## 2014-11-07 DIAGNOSIS — S72002D Fracture of unspecified part of neck of left femur, subsequent encounter for closed fracture with routine healing: Secondary | ICD-10-CM

## 2014-11-07 DIAGNOSIS — I480 Paroxysmal atrial fibrillation: Secondary | ICD-10-CM

## 2014-11-07 LAB — GLUCOSE, CAPILLARY
GLUCOSE-CAPILLARY: 114 mg/dL — AB (ref 65–99)
Glucose-Capillary: 109 mg/dL — ABNORMAL HIGH (ref 65–99)
Glucose-Capillary: 110 mg/dL — ABNORMAL HIGH (ref 65–99)
Glucose-Capillary: 118 mg/dL — ABNORMAL HIGH (ref 65–99)
Glucose-Capillary: 131 mg/dL — ABNORMAL HIGH (ref 65–99)

## 2014-11-07 LAB — BASIC METABOLIC PANEL
Anion gap: 9 (ref 5–15)
BUN: 9 mg/dL (ref 6–20)
CALCIUM: 8.5 mg/dL — AB (ref 8.9–10.3)
CHLORIDE: 103 mmol/L (ref 101–111)
CO2: 23 mmol/L (ref 22–32)
Creatinine, Ser: 1.24 mg/dL (ref 0.61–1.24)
GFR calc Af Amer: >60 mL/min (ref >60)
GFR calc non Af Amer: 54 mL/min — ABNORMAL LOW (ref >60)
GLUCOSE: 101 mg/dL — AB (ref 65–99)
Potassium: 3.2 mmol/L — ABNORMAL LOW (ref 3.5–5.1)
SODIUM: 135 mmol/L (ref 135–145)

## 2014-11-07 LAB — CBC
HCT: 30.2 % — ABNORMAL LOW (ref 39.0–52.0)
HEMOGLOBIN: 10.3 g/dL — AB (ref 13.0–17.0)
MCH: 31.2 pg (ref 26.0–34.0)
MCHC: 34.1 g/dL (ref 30.0–36.0)
MCV: 91.5 fL (ref 78.0–100.0)
Platelets: 379 10*3/uL (ref 150–400)
RBC: 3.3 MIL/uL — AB (ref 4.22–5.81)
RDW: 15 % (ref 11.5–15.5)
WBC: 10.3 10*3/uL (ref 4.0–10.5)

## 2014-11-07 LAB — HEPARIN LEVEL (UNFRACTIONATED): HEPARIN UNFRACTIONATED: 0.47 [IU]/mL (ref 0.30–0.70)

## 2014-11-07 MED ORDER — POTASSIUM CHLORIDE 10 MEQ/100ML IV SOLN
10.0000 meq | INTRAVENOUS | Status: DC
  Administered 2014-11-07 (×3): 10 meq via INTRAVENOUS
  Filled 2014-11-07 (×6): qty 100

## 2014-11-07 MED ORDER — POTASSIUM CHLORIDE CRYS ER 20 MEQ PO TBCR
40.0000 meq | EXTENDED_RELEASE_TABLET | Freq: Four times a day (QID) | ORAL | Status: AC
  Administered 2014-11-07 – 2014-11-08 (×3): 40 meq via ORAL
  Filled 2014-11-07 (×3): qty 2

## 2014-11-07 MED ORDER — CALCIUM CARBONATE ANTACID 500 MG PO CHEW
1.0000 | CHEWABLE_TABLET | Freq: Once | ORAL | Status: DC
  Filled 2014-11-07: qty 1

## 2014-11-07 NOTE — Progress Notes (Signed)
NG tube clamped throughout the shift. Pt denies nausea and vomiting. Pt tolerated clear liquids diet well.

## 2014-11-07 NOTE — Care Management Note (Signed)
Case Management Note  Patient Details  Name: Howard Charles MRN: [id number] Date of Birth: 11/26/1935  Subjective/Objective:    Pt admitted with Closed Hip Fracture, s/p hip fracture repair pt developed ileus.                 Action/Plan:  Pt has undergone multiple decompression colonoscopies with minimal improvement.  CM will continue to monitor for disposition needs   Expected Discharge Date:                  Expected Discharge Plan:  [place]  In-House Referral:  Clinical Social Work  Discharge planning Services  CM Consult  Post Acute Care Choice:    Choice offered to:     DME Arranged:    DME Agency:     HH Arranged:    [place] Agency:     Status of Service:  In process, will continue to follow  Medicare Important Message Given: Yes Medicare IM give by: [name] Date Given: 11/01/14  Date Additional Medicare IM Given:  11/06/14 Additional Medicare Important Message give by:  [name]  If discussed at Long Length of Stay Meetings, dates discussed:  11/07/14  Additional Comments:  [name], RN 11/07/2014, 11:57 AM

## 2014-11-07 NOTE — Progress Notes (Signed)
ANTICOAGULATION CONSULT NOTE - Follow Up Consult  Pharmacy Consult for Heparin Indication: atrial fibrillation  No Known Allergies  Patient Measurements: Height: 5\' 10"  (177.8 cm) Weight: 212 lb 8 oz (96.389 kg) (wife request) IBW/kg (Calculated) : 73 Heparin Dosing Weight: 94 kg  Vital Signs: Temp: 97.9 F (36.6 C) (06/09 0436) Temp Source: Oral (06/09 0436) BP: 138/75 mmHg (06/09 0436) Pulse Rate: 83 (06/09 0436)  Labs:  Recent Labs  11/05/14 0436 11/06/14 0500 11/07/14 0444  HGB 10.2* 10.6* 10.3*  HCT 30.8* 31.2* 30.2*  PLT 392 375 379  HEPARINUNFRC 0.49 0.44 0.47  CREATININE 1.37* 1.30* 1.24    Estimated Creatinine Clearance: 58.1 mL/min (by C-G formula based on Cr of 1.24).  Assessment: 79 yo male with hx afib on xarelto PTA. He is s/p fall with hip fracture and repair on 5/22 and and in now noted with post-op ileus > NPO.  IV heparin continues - heparin at goal on on 1450 units/hr. CBC stable. No bleeding noted.  Noted plans to clamp NG tube and try sips of clears today.  Will follow for toleration of PO.  Goal of Therapy:  Heparin level 0.3-0.7 units/ml Monitor platelets by anticoagulation protocol: Yes   Plan:  Continue heparin at 1450 units/hr Daily heparin level and cbc Will continue to monitor for any signs/symptoms of bleeding Will follow-up for restart of Xarelto  Thank you for allowing pharmacy to be a part of this patients care team.  Horton Chin, Pharm.D., BCPS Clinical Pharmacist Pager [phone] 11/07/2014 10:08 AM

## 2014-11-07 NOTE — Progress Notes (Signed)
UR Completed. Saliha Salts, RN, BSN.  [phone] 

## 2014-11-07 NOTE — Progress Notes (Addendum)
     Subjective:  Patient reports pain as mild.  Left hip mobility improving.  No new complaints.  He was having some neck pain, that he says is improving.    Objective:   VITALS:   Filed Vitals:   11/06/14 0416 11/06/14 1416 11/06/14 2300 11/07/14 0436  BP: 146/88 147/80 145/85 138/75  Pulse: 94 91 99 83  Temp: 98.3 F (36.8 C) 98.2 F (36.8 C) 98.2 F (36.8 C) 97.9 F (36.6 C)  TempSrc: Oral Oral Oral Oral  Resp: 18 17 18 17   Height:      Weight:      SpO2: 100% 97% 96% 96%    Neurologically intact Dorsiflexion/Plantar flexion intact Incision: dressing C/D/I Abdomen softer, although still protuberant.  NG in place. Cervical motion fair, mildly restricted.    Lab Results  Component Value Date   WBC 10.3 11/07/2014   HGB 10.3* 11/07/2014   HCT 30.2* 11/07/2014   MCV 91.5 11/07/2014   PLT 379 11/07/2014   BMET    Component Value Date/Time   NA 135 11/07/2014 0444   K 3.2* 11/07/2014 0444   CL 103 11/07/2014 0444   CO2 23 11/07/2014 0444   GLUCOSE 101* 11/07/2014 0444   BUN 9 11/07/2014 0444   CREATININE 1.24 11/07/2014 0444   CALCIUM 8.5* 11/07/2014 0444   GFRNONAA 37* 11/07/2014 0444   GFRAA >60 11/07/2014 0444   XR of neck with DJD XR of hip with good position of hardware  Assessment/Plan: 6 Days Post-Op   Principal Problem:   Closed left hip fracture Active Problems:   Chronic renal insufficiency, stage III (moderate)   Atrial fibrillation with RVR   Diabetes mellitus type 2 in obese   Hypertension   A-fib   Prostate cancer   Ileus, postoperative   Ileus   Up with therapy C/w PT Ileus per primary team  Hip and neck doing ok.  No acute findings on neck.  WBAT, anticoag per primary team.   RTC with me in 2 weeks after dc.    Howard Charles P 11/07/2014, 2:04 PM    ADDENDUM:  PATIENT DOES NOT HAVE ALZHEIMER'S.  INCORRECTLY PULLED FORWARD FROM HISTORICAL INACCURATE NOTE.  THIS NOTE HAS BEEN CORRECTED.

## 2014-11-07 NOTE — Progress Notes (Signed)
[facility] PROGRESS NOTE Interim History: 79 y.o. M with history of DM, HTN, GERD, DJD, A fib on Xarelto, was admitted on 5/21 after suffering a fall fall while at the grocery store. He lost balance and fell which resulted in left hip fracture. He was admitted and underwent hip repair on 10/18/2014. His post operative course has been complicated by A fib with RVR requiring cardizem drip and SDU transfer, and he has been alternating between A fib and sinus rhythm. Dr. Einar Gip with cardiology has been consulted and is following patient while hospitalized. Patient's cardizem was changed over to Amiodarone on 6/6. Postoperatively he developed also a severe ileus, which required decompression x 2, has been very slow to improve, thus his cardizem gtt was discontinued as it can contribute. Cardiology, GI and General Surgery are following.   HPI/Subjective: -Patient reporting passing some flatus, thinks he is doing a little better today. He denies abdominal pain. NG tube remains in place, currently nothing by mouth  Assessment/Plan:  Ileus, postoperative - s/p decompression with rectal tube and NG on 5/25, subsequently improving, tubes were removed and his diet was advanced. His ileus started to get worse again and patient underwent a colonoscopy for decompression on 6/3 and rectal tube and NG. Currently his rectal tube has been removed as he appears to be improving and NG tube is still in.  - mobilize with PT - all his narcotics were discontinued on 6/2, pain controlled without - Cont Reglan, continue IVF, erythromycin - aggressive lytes repletion, Keep mag. > 2.0 and K >4.0. - repeat abdominal film with persistent gaseous distention of small bowel loops, significant bowel wall thickening of cecum and ascending colon  -NG tube was clamped today as he is tolerating clears. He reports having several bowel movements yesterday and is passing flatus. -Replacing potassium  Atrial fibrillation with  RVR: -Patient remains on amiodarone drip, plan to transition to oral regimen in the next 24 hours -Currently rate controlled with ventricular rates in the 80's -Currently on heparin drip  Hypokalemia -Likely resulting from GI losses -Will replace with IV and by mouth potassium today  Left closed hip fracture: - Orthopedic surgery was consulted, s/p trochanteric IM nailing performed on 10/18/2014. - Physical therapy was consulted which recommended skilled nursing facility - Cont physical therapy  Cognitive impairment: -Patient having a few episodes of increase confusion mainly at nighttime -He is currently stable, would recommend mental status exam in the outpatient setting once acute issues have resolved  Acute on Chronic kidney see stage III: - Most likely prerenal due to decreased oral intake. -A.m. lab work showing stable creatinine of 1.24  Diabetes mellitus type 2 in obese: -His blood sugars are well-controlled on the low 100 range -Continue Lantus 20 units subcutaneous  Essential  Hypertension - Systolic blood pressures in the 130s   Code Status: Full Code Family Communication: I spoke to his wife who was present at bedside Disposition Plan: SNF when ileus better  Consultants:  GI  Cardiology   General surgery  Procedures:  abd x-ray  Colonoscopy 5.26.2016: Multiple short discrete areas of ischemia in the sigmoid descending and transverse with normal bowel in between an air and fluid suctioned as above and no other obvious findings in unprepped exam  CT abd and pelvis 5.26.2016: Decompressed stomach, small bowel and colon following NG tube  insertion and decompression colonoscopy.  Antibiotics:  None  Objective: Filed Vitals:   11/06/14 1416 11/06/14 2300 11/07/14 0436 11/07/14 1400  BP:  147/80 145/85 138/75 139/65  Pulse: 91 99 83 86  Temp: 98.2 F (36.8 C) 98.2 F (36.8 C) 97.9 F (36.6 C) 98 F (36.7 C)  TempSrc: Oral Oral Oral Oral  Resp:  17 18 17 18   Height:      Weight:      SpO2: 97% 96% 96% 95%    Intake/Output Summary (Last 24 hours) at 11/07/14 1445 Last data filed at 11/07/14 1009  Gross per 24 hour  Intake      1 ml  Output   1175 ml  Net  -1174 ml   Filed Weights   10/24/14 1442 11/04/14 1600  Weight: 101.606 kg (224 lb) 96.389 kg (212 lb 8 oz)   Exam: General: NAD HEENT: No bruits, no goiter.  Heart: Regular rate and rhythm. Lungs: Good air movement,clear Abdomen: Patient having a soft abdomen, bowel sounds were heard in all 4 quadrants, abdomen nondistended, nontender Skin: no rashes Neuro: non focal, alert and oriented x 3 Psych: normal mood and affect  Data Reviewed: Basic Metabolic Panel:  Recent Labs Lab 11/02/14 0347 11/03/14 0600 11/04/14 1425 11/05/14 0436 11/06/14 0500 11/07/14 0444  NA 132* 134* 129* 132* 133* 135  K 3.9 3.4* 2.8* 3.2* 3.1* 3.2*  CL 99* 100* 93* 100* 101 103  CO2 23 23 20* 21* 21* 23  GLUCOSE 142* 103* 376* 87 115* 101*  BUN 20 17 14 11 12 9   CREATININE 1.80* 1.55* 1.32* 1.37* 1.30* 1.24  CALCIUM 8.8* 8.8* 8.4* 8.8* 8.5* 8.5*  MG 1.7  --  1.6* 1.6* 2.0  --   PHOS 3.6  --   --   --  3.2  --     CBC:  Recent Labs Lab 11/03/14 0600 11/04/14 0427 11/05/14 0436 11/06/14 0500 11/07/14 0444  WBC 16.2* 14.7* 13.6* 10.9* 10.3  HGB 9.7* 11.2* 10.2* 10.6* 10.3*  HCT 29.0* 33.9* 30.8* 31.2* 30.2*  MCV 94.2 93.4 91.9 91.2 91.5  PLT 385 432* 392 375 379    CBG:  Recent Labs Lab 11/06/14 2030 11/07/14 0018 11/07/14 0434 11/07/14 0800 11/07/14 1144  GLUCAP 127* 114* 109* 110* 118*    No results found for this or any previous visit (from the past 240 hour(s)).  Studies: Ct Abdomen Pelvis W Contrast 11/04/2014 1. Although there is recurrent dilatation of the mid small bowel with multiple air-fluid levels, the findings are favored to reflect a regional small bowel ileus, as there is no transition point to suggest frank obstruction, and no dilatation of the  more proximal small bowel (although there is a nasogastric tube in place). 2. Multifocal thickening of the colonic wall, suggestive of colitis. 3. Normal appendix. 4. Small hiatal hernia. 5. Atherosclerosis. 6. Additional incidental findings, as above.   Dg Abd 2 Views 11/04/2014 Persistent severely distended small bowel with paucity of intra colonic bowel gas. These findings are consistent with small bowel obstruction. NG tube tip noted below the hemidiaphragms.    Dg Abd 2 Views 11/03/2014 Significant bowel wall thickening of cecum and ascending colon highly suspicious for colitis.  Persistent gaseous distention of small bowel loops which could be related to obstruction or ileus.    Scheduled Meds: . allopurinol  300 mg Oral q morning - 10a  . calcium carbonate  1 tablet Oral Once  . erythromycin  250 mg Intravenous 3 times per day  . insulin glargine  20 Units Subcutaneous QHS  . metoCLOPramide (REGLAN) injection  10 mg Intravenous 4 times per day  .  potassium chloride  40 mEq Oral Q6H  . rosuvastatin  10 mg Oral QPM   Continuous Infusions: . sodium chloride 50 mL/hr at 10/31/14 1034  . amiodarone 30 mg/hr (11/07/14 1127)  . dextrose 5 % and 0.45% NaCl 50 mL/hr at 11/07/14 0624  . heparin 1,450 Units/hr (11/07/14 0244)   Time spent: 25 minutes  Kelvin Cellar  [facility] Pager [phone]. If 7PM-7AM, please contact night-coverage at [email or web address], password [facility] 11/07/2014, 2:45 PM  LOS: 19 days

## 2014-11-07 NOTE — Progress Notes (Signed)
No abdominal pain. States he is passing some stool.  Physical: He is in no distress  Abdomen: Bowel sounds were heard. Soft nontender  Impression: Ileus  Plan: Ambulate. Avoid narcotics. Keep potassium corrected.

## 2014-11-08 DIAGNOSIS — I10 Essential (primary) hypertension: Secondary | ICD-10-CM

## 2014-11-08 LAB — CBC
HCT: 34.8 % — ABNORMAL LOW (ref 39.0–52.0)
Hemoglobin: 11.8 g/dL — ABNORMAL LOW (ref 13.0–17.0)
MCH: 30.9 pg (ref 26.0–34.0)
MCHC: 33.9 g/dL (ref 30.0–36.0)
MCV: 91.1 fL (ref 78.0–100.0)
Platelets: 376 10*3/uL (ref 150–400)
RBC: 3.82 MIL/uL — ABNORMAL LOW (ref 4.22–5.81)
RDW: 15.1 % (ref 11.5–15.5)
WBC: 10.9 10*3/uL — AB (ref 4.0–10.5)

## 2014-11-08 LAB — BASIC METABOLIC PANEL
Anion gap: 10 (ref 5–15)
BUN: 9 mg/dL (ref 6–20)
CALCIUM: 9.1 mg/dL (ref 8.9–10.3)
CO2: 20 mmol/L — AB (ref 22–32)
Chloride: 103 mmol/L (ref 101–111)
Creatinine, Ser: 1.24 mg/dL (ref 0.61–1.24)
GFR calc Af Amer: >60 mL/min (ref >60)
GFR calc non Af Amer: 54 mL/min — ABNORMAL LOW (ref >60)
GLUCOSE: 124 mg/dL — AB (ref 65–99)
Potassium: 4.1 mmol/L (ref 3.5–5.1)
SODIUM: 133 mmol/L — AB (ref 135–145)

## 2014-11-08 LAB — GLUCOSE, CAPILLARY
GLUCOSE-CAPILLARY: 163 mg/dL — AB (ref 65–99)
Glucose-Capillary: 132 mg/dL — ABNORMAL HIGH (ref 65–99)
Glucose-Capillary: 144 mg/dL — ABNORMAL HIGH (ref 65–99)
Glucose-Capillary: 139 mg/dL — ABNORMAL HIGH (ref 65–99)
Glucose-Capillary: 155 mg/dL — ABNORMAL HIGH (ref 65–99)
Glucose-Capillary: 164 mg/dL — ABNORMAL HIGH (ref 65–99)
Glucose-Capillary: 142 mg/dL — ABNORMAL HIGH (ref 65–99)

## 2014-11-08 LAB — HEPARIN LEVEL (UNFRACTIONATED): Heparin Unfractionated: 0.42 IU/mL (ref 0.30–0.70)

## 2014-11-08 MED ORDER — RIVAROXABAN 20 MG PO TABS
20.0000 mg | ORAL_TABLET | Freq: Every day | ORAL | Status: DC
  Administered 2014-11-08 – 2014-11-11 (×4): 20 mg via ORAL
  Filled 2014-11-08 (×5): qty 1

## 2014-11-08 MED ORDER — AMIODARONE HCL 200 MG PO TABS
400.0000 mg | ORAL_TABLET | Freq: Every day | ORAL | Status: DC
  Administered 2014-11-08 – 2014-11-09 (×2): 400 mg via ORAL
  Filled 2014-11-08 (×2): qty 2

## 2014-11-08 MED ORDER — HEPARIN (PORCINE) IN NACL 100-0.45 UNIT/ML-% IJ SOLN
1450.0000 [IU]/h | INTRAMUSCULAR | Status: DC
  Administered 2014-11-08: 1450 [IU]/h via INTRAVENOUS
  Filled 2014-11-08 (×3): qty 250

## 2014-11-08 MED ORDER — AMIODARONE HCL IN DEXTROSE 360-4.14 MG/200ML-% IV SOLN
30.0000 mg/h | INTRAVENOUS | Status: DC
  Filled 2014-11-08 (×4): qty 200

## 2014-11-08 MED ORDER — RIVAROXABAN 20 MG PO TABS
20.0000 mg | ORAL_TABLET | Freq: Every day | ORAL | Status: DC
  Filled 2014-11-08: qty 1

## 2014-11-08 MED ORDER — AMIODARONE HCL 200 MG PO TABS
200.0000 mg | ORAL_TABLET | Freq: Two times a day (BID) | ORAL | Status: DC
  Filled 2014-11-08 (×2): qty 1

## 2014-11-08 NOTE — Progress Notes (Signed)
Physical Therapy Treatment Patient Details Name: Howard Charles MRN: [id number] DOB: 21-Jul-1935 Today's Date: 11/08/2014    History of Present Illness Pt is a 79 y.o. M s/p fall and L hip fx.  Pt's PMH includes DM, diabetic neuropathy, gout, HTN, B TKA, B hearing loss, chronic renal insufficiency, on supplemental O2 at night, a fib, rotator cuff arthroscopy.    PT Comments    Pt progressing towards physical therapy goals. Was able to progress ambulation distance this session with 1 seated rest break required. Continues to have difficulty with active range in the LLE, however pt reports he is performing HEP with assist of wife. Discussed progression of exercise as pt is able. Will continue to follow.   Follow Up Recommendations  SNF;Supervision for mobility/OOB     Equipment Recommendations  Rolling walker with 5" wheels    Recommendations for Other Services       Precautions / Restrictions Precautions Precautions: Fall Restrictions Weight Bearing Restrictions: Yes LLE Weight Bearing: Weight bearing as tolerated    Mobility  Bed Mobility Overal bed mobility: Needs Assistance Bed Mobility: Sit to Supine       Sit to supine: Max assist   General bed mobility comments: Pt required assist for LE elevation onto bed. +2 required to scoot up to [facility].  Transfers Overall transfer level: Needs assistance Equipment used: Rolling walker (2 wheeled) Transfers: Sit to/from Stand Sit to Stand: Mod assist;+2 physical assistance         General transfer comment: +2 required to power-up to full standing from recliner chair.  Ambulation/Gait Ambulation/Gait assistance: Min guard Ambulation Distance (Feet): 115 Feet Assistive device: Rolling walker (2 wheeled) Gait Pattern/deviations: Step-through pattern;Decreased stride length;Trunk flexed Gait velocity: Decreased Gait velocity interpretation: Below normal speed for age/gender General Gait Details: Chair follow utilized. Pt  required 1 seated rest break.   Stairs            Wheelchair Mobility    Modified Rankin (Stroke Patients Only)       Balance Overall balance assessment: Needs assistance;History of Falls Sitting-balance support: Feet supported;No upper extremity supported Sitting balance-Leahy Scale: Fair     Standing balance support: Bilateral upper extremity supported;During functional activity Standing balance-Leahy Scale: Poor                      Cognition Arousal/Alertness: Awake/alert Behavior During Therapy: WFL for tasks assessed/performed Overall Cognitive Status: History of cognitive impairments - at baseline                      Exercises Total Joint Exercises Ankle Circles/Pumps: 10 reps Quad Sets: 10 reps Heel Slides: 10 reps    General Comments        Pertinent Vitals/Pain Pain Assessment: Faces Faces Pain Scale: Hurts little more Pain Location: LLE with mobility Pain Descriptors / Indicators: Grimacing;Guarding Pain Intervention(s): Limited activity within patient's tolerance;Monitored during session;Repositioned    Home Living                      Prior Function            PT Goals (current goals can now be found in the care plan section) Acute Rehab PT Goals Patient Stated Goal: not stated PT Goal Formulation: With patient Time For Goal Achievement: 11/15/14 Potential to Achieve Goals: Good Progress towards PT goals: Progressing toward goals    Frequency  Min 3X/week    PT Plan Current plan  remains appropriate    Co-evaluation             End of Session Equipment Utilized During Treatment: Gait belt Activity Tolerance: Patient tolerated treatment well Patient left: in chair;with call bell/phone within reach;with family/visitor present     Time: 5093-2671 PT Time Calculation (min) (ACUTE ONLY): 29 min  Charges:  $Gait Training: 8-22 mins $Therapeutic Activity: 8-22 mins                    G Codes:       Rolinda Roan 11/28/14, 12:53 PM   Rolinda Roan, PT, DPT Acute Rehabilitation Services Pager: [phone]

## 2014-11-08 NOTE — Progress Notes (Signed)
CSW will continue to follow patient for transfer to [facility] when medically stable.  Domenica Reamer, [place] Social Worker [phone]

## 2014-11-08 NOTE — Progress Notes (Signed)
[facility] PROGRESS NOTE Interim History: 79 y.o. M with history of DM, HTN, GERD, DJD, A fib on Xarelto, was admitted on 5/21 after suffering a fall fall while at the grocery store. He lost balance and fell which resulted in left hip fracture. He was admitted and underwent hip repair on 10/18/2014. His post operative course has been complicated by A fib with RVR requiring cardizem drip and SDU transfer, and he has been alternating between A fib and sinus rhythm. Dr. Einar Gip with cardiology has been consulted and is following patient while hospitalized. Patient's cardizem was changed over to Amiodarone on 6/6. Postoperatively he developed also a severe ileus, which required decompression x 2, has been very slow to improve, thus his cardizem gtt was discontinued as it can contribute. Cardiology, GI and General Surgery are following.   HPI/Subjective: -His NG tube was clamped yesterday as he tolerated clears. Also reported having several bowel movements and passing flatus.   Assessment/Plan:  Ileus, postoperative - s/p decompression with rectal tube and NG on 5/25, subsequently improving, tubes were removed and his diet was advanced. His ileus started to get worse again and patient underwent a colonoscopy for decompression on 6/3 and rectal tube and NG. Currently his rectal tube has been removed as he appears to be improving and NG tube is still in.  - mobilize with PT - all his narcotics were discontinued on 6/2, pain controlled without - Cont Reglan, continue IVF, erythromycin - aggressive lytes repletion, Keep mag. > 2.0 and K >4.0. - repeat abdominal film with persistent gaseous distention of small bowel loops, significant bowel wall thickening of cecum and ascending colon  -His NG tube was pulled on 11/08/2014 after which diet advanced to full liquids. RN reported an episode on N/V though feels he may have tried to take liquids too quickly. Will give it another try with slow  introduction of clears -Potassium at 4.1  Atrial fibrillation with RVR: -Patient remains on amiodarone drip, plan to transition to oral regimen in the next 24 hours -Currently rate controlled with ventricular rates in the 80's -Currently on heparin drip, will transition to oral anticoagulation once tolerating PO  Hypokalemia -After potassium supplementation, his potassium improved to 4.1  Left closed hip fracture: - Orthopedic surgery was consulted, s/p trochanteric IM nailing performed on 10/18/2014. - Physical therapy was consulted which recommended skilled nursing facility - Cont physical therapy  Cognitive impairment: -Patient having a few episodes of increase confusion mainly at nighttime -He is currently stable, would recommend mental status exam in the outpatient setting once acute issues have resolved  Acute on Chronic kidney see stage III: - Most likely prerenal due to decreased oral intake. -A.m. lab work showing stable creatinine of 1.24  Diabetes mellitus type 2 in obese: -His blood sugars are well-controlled on the low 100 range -Continue Lantus 20 units subcutaneous  Essential  Hypertension - Systolic blood pressures in the 130s - 140's   Code Status: Full Code Family Communication: I spoke to his wife who was present at bedside Disposition Plan: SNF   Consultants:  GI  Cardiology   General surgery  Procedures:  abd x-ray  Colonoscopy 5.26.2016: Multiple short discrete areas of ischemia in the sigmoid descending and transverse with normal bowel in between an air and fluid suctioned as above and no other obvious findings in unprepped exam  CT abd and pelvis 5.26.2016: Decompressed stomach, small bowel and colon following NG tube  insertion and decompression colonoscopy.  Antibiotics:  None  Objective: Filed Vitals:   11/07/14 0436 11/07/14 1400 11/07/14 2053 11/08/14 0406  BP: 138/75 139/65 146/84 138/78  Pulse: 83 86 96 93  Temp: 97.9 F  (36.6 C) 98 F (36.7 C) 98.5 F (36.9 C) 98.4 F (36.9 C)  TempSrc: Oral Oral Oral Oral  Resp: 17 18 18 18   Height:      Weight:      SpO2: 96% 95% 98% 96%    Intake/Output Summary (Last 24 hours) at 11/08/14 1145 Last data filed at 11/08/14 0900  Gross per 24 hour  Intake    240 ml  Output   1852 ml  Net  -1612 ml   Filed Weights   10/24/14 1442 11/04/14 1600  Weight: 101.606 kg (224 lb) 96.389 kg (212 lb 8 oz)   Exam: General: NAD HEENT: No bruits, no goiter.  Heart: Regular rate and rhythm. Lungs: Good air movement,clear Abdomen: Patient having a soft abdomen, bowel sounds were heard in all 4 quadrants, abdomen nondistended, nontender Skin: no rashes Neuro: non focal, alert and oriented x 3 Psych: normal mood and affect  Data Reviewed: Basic Metabolic Panel:  Recent Labs Lab 11/02/14 0347  11/04/14 1425 11/05/14 0436 11/06/14 0500 11/07/14 0444 11/08/14 0209  NA 132*  < > 129* 132* 133* 135 133*  K 3.9  < > 2.8* 3.2* 3.1* 3.2* 4.1  CL 99*  < > 93* 100* 101 103 103  CO2 23  < > 20* 21* 21* 23 20*  GLUCOSE 142*  < > 376* 87 115* 101* 124*  BUN 20  < > 14 11 12 9 9   CREATININE 1.80*  < > 1.32* 1.37* 1.30* 1.24 1.24  CALCIUM 8.8*  < > 8.4* 8.8* 8.5* 8.5* 9.1  MG 1.7  --  1.6* 1.6* 2.0  --   --   PHOS 3.6  --   --   --  3.2  --   --   < > = values in this interval not displayed.  CBC:  Recent Labs Lab 11/04/14 0427 11/05/14 0436 11/06/14 0500 11/07/14 0444 11/08/14 0209  WBC 14.7* 13.6* 10.9* 10.3 10.9*  HGB 11.2* 10.2* 10.6* 10.3* 11.8*  HCT 33.9* 30.8* 31.2* 30.2* 34.8*  MCV 93.4 91.9 91.2 91.5 91.1  PLT 432* 392 375 379 376    CBG:  Recent Labs Lab 11/07/14 1639 11/07/14 2046 11/08/14 0035 11/08/14 0403 11/08/14 0801  GLUCAP 131* 144* 132* 139* 155*    No results found for this or any previous visit (from the past 240 hour(s)).  Studies: Ct Abdomen Pelvis W Contrast 11/04/2014 1. Although there is recurrent dilatation of the mid  small bowel with multiple air-fluid levels, the findings are favored to reflect a regional small bowel ileus, as there is no transition point to suggest frank obstruction, and no dilatation of the more proximal small bowel (although there is a nasogastric tube in place). 2. Multifocal thickening of the colonic wall, suggestive of colitis. 3. Normal appendix. 4. Small hiatal hernia. 5. Atherosclerosis. 6. Additional incidental findings, as above.   Dg Abd 2 Views 11/04/2014 Persistent severely distended small bowel with paucity of intra colonic bowel gas. These findings are consistent with small bowel obstruction. NG tube tip noted below the hemidiaphragms.    Dg Abd 2 Views 11/03/2014 Significant bowel wall thickening of cecum and ascending colon highly suspicious for colitis.  Persistent gaseous distention of small bowel loops which could be related to obstruction or ileus.  Scheduled Meds: . allopurinol  300 mg Oral q morning - 10a  . amiodarone  200 mg Oral BID  . calcium carbonate  1 tablet Oral Once  . erythromycin  250 mg Intravenous 3 times per day  . insulin glargine  20 Units Subcutaneous QHS  . metoCLOPramide (REGLAN) injection  10 mg Intravenous 4 times per day  . rivaroxaban  20 mg Oral QAC supper  . rosuvastatin  10 mg Oral QPM   Continuous Infusions: . sodium chloride 50 mL/hr at 10/31/14 1034  . dextrose 5 % and 0.45% NaCl 50 mL/hr at 11/08/14 0159   Time spent: 25 minutes  Kelvin Cellar  [facility] Pager [phone]. If 7PM-7AM, please contact night-coverage at [email or web address], password [facility] 11/08/2014, 11:45 AM  LOS: 20 days

## 2014-11-08 NOTE — Progress Notes (Signed)
[facility] Progress Note  Subjective: He states he had several loose bowel movements last night. Feels rumbling in his abdomen. No complaints of abdominal pain.  Objective: Vital signs in last 24 hours: Temp:  [98 F (36.7 C)-98.5 F (36.9 C)] 98.4 F (36.9 C) (06/10 0406) Pulse Rate:  [86-96] 93 (06/10 0406) Resp:  [18] 18 (06/10 0406) BP: (138-146)/(65-84) 138/78 mmHg (06/10 0406) SpO2:  [95 %-98 %] 96 % (06/10 0406) Weight change:    PE  He is in no distress  Abdomen: Few bowel sounds present. Soft and nontender  Lab Results: Results for orders placed or performed during the hospital encounter of 10/19/14 (from the past 24 hour(s))  Glucose, capillary     Status: Abnormal   Collection Time: 11/07/14  4:39 PM  Result Value Ref Range   Glucose-Capillary 131 (H) 65 - 99 mg/dL   Comment 1 Notify RN    Comment 2 Document in Chart   Glucose, capillary     Status: Abnormal   Collection Time: 11/07/14  8:46 PM  Result Value Ref Range   Glucose-Capillary 144 (H) 65 - 99 mg/dL  Glucose, capillary     Status: Abnormal   Collection Time: 11/08/14 12:35 AM  Result Value Ref Range   Glucose-Capillary 132 (H) 65 - 99 mg/dL  Heparin level (unfractionated)     Status: None   Collection Time: 11/08/14  2:09 AM  Result Value Ref Range   Heparin Unfractionated 0.42 0.30 - 0.70 IU/mL  CBC     Status: Abnormal   Collection Time: 11/08/14  2:09 AM  Result Value Ref Range   WBC 10.9 (H) 4.0 - 10.5 K/uL   RBC 3.82 (L) 4.22 - 5.81 MIL/uL   Hemoglobin 11.8 (L) 13.0 - 17.0 g/dL   HCT 34.8 (L) 39.0 - 52.0 %   MCV 91.1 78.0 - 100.0 fL   MCH 30.9 26.0 - 34.0 pg   MCHC 33.9 30.0 - 36.0 g/dL   RDW 15.1 11.5 - 15.5 %   Platelets 376 150 - 400 K/uL  Basic metabolic panel     Status: Abnormal   Collection Time: 11/08/14  2:09 AM  Result Value Ref Range   Sodium 133 (L) 135 - 145 mmol/L   Potassium 4.1 3.5 - 5.1 mmol/L   Chloride 103 101 - 111 mmol/L   CO2 20 (L) 22 - 32  mmol/L   Glucose, Bld 124 (H) 65 - 99 mg/dL   BUN 9 6 - 20 mg/dL   Creatinine, Ser 1.24 0.61 - 1.24 mg/dL   Calcium 9.1 8.9 - 10.3 mg/dL   GFR calc non Af Amer 54 (L) >60 mL/min   GFR calc Af Amer >60 >60 mL/min   Anion gap 10 5 - 15  Glucose, capillary     Status: Abnormal   Collection Time: 11/08/14  4:03 AM  Result Value Ref Range   Glucose-Capillary 139 (H) 65 - 99 mg/dL  Glucose, capillary     Status: Abnormal   Collection Time: 11/08/14  8:01 AM  Result Value Ref Range   Glucose-Capillary 155 (H) 65 - 99 mg/dL   Comment 1 Notify RN   Glucose, capillary     Status: Abnormal   Collection Time: 11/08/14 11:57 AM  Result Value Ref Range   Glucose-Capillary 164 (H) 65 - 99 mg/dL   Comment 1 Notify RN     Studies/Results: No results found.    Assessment: Postop ileus. Review of past colonoscopy showed he  had some ischemic areas in the colon.  Plan:   Continue supportive care    Cassell Clement 11/08/2014, 1:01 PM  Pager: [phone] If no answer or after 5 PM call [phone]

## 2014-11-08 NOTE — Care Management Note (Signed)
Case Management Note  Patient Details  Name: Howard Charles MRN: [id number] Date of Birth: [birth year]  Subjective/Objective:    Pt admitted with Closed Hip Fracture, s/p hip fracture repair pt developed ileus.                 Action/Plan:  Pt has undergone multiple decompression colonoscopies with minimal improvement.  CM will continue to monitor for disposition needs   Expected Discharge Date:                  Expected Discharge Plan:  [place]  In-House Referral:  Clinical Social Work  Discharge planning Services  CM Consult  Post Acute Care Choice:    Choice offered to:     DME Arranged:    DME Agency:     HH Arranged:    [place] Agency:     Status of Service:  In process, will continue to follow  Medicare Important Message Given: Yes Medicare IM give by: [name] Date Given: 11/01/14  Date Additional Medicare IM Given:  11/08/14 Additional Medicare Important Message give by:  [name]  If discussed at Long Length of Stay Meetings, dates discussed:  11/07/14  Additional Comments:  [name], RN 11/08/2014, 11:04 AM

## 2014-11-08 NOTE — Progress Notes (Addendum)
ANTICOAGULATION CONSULT NOTE - Follow Up Consult  Pharmacy Consult for xarelto Indication: atrial fibrillation  No Known Allergies  Patient Measurements: Height: 5\' 10"  (177.8 cm) Weight: 212 lb 8 oz (96.389 kg) (wife request) IBW/kg (Calculated) : 73 Heparin Dosing Weight: 94 kg  Vital Signs: Temp: 98 F (36.7 C) (06/10 1354) Temp Source: Oral (06/10 1354) BP: 117/61 mmHg (06/10 1354) Pulse Rate: 91 (06/10 1354)  Labs:  Recent Labs  11/06/14 0500 11/07/14 0444 11/08/14 0209  HGB 10.6* 10.3* 11.8*  HCT 31.2* 30.2* 34.8*  PLT 375 379 376  HEPARINUNFRC 0.44 0.47 0.42  CREATININE 1.30* 1.24 1.24    Estimated Creatinine Clearance: 58.1 mL/min (by C-G formula based on Cr of 1.24).  Assessment: 79 yo male with hx afib on xarelto PTA. He is s/p fall with hip fracture and repair on 5/22 and and in now noted with post-op ileus > NPO.  Plan is to convert heparin to xarelto tonight.   Also amiodarone to PO. He is getting ~720mg /day.    Plan:   Dc heparin Xarelto 20mg  PO qday Change amiodarone to 400mg  PO qday  Onnie Boer, PharmD Pager: [phone] 11/08/2014 4:58 PM

## 2014-11-09 LAB — GLUCOSE, CAPILLARY
GLUCOSE-CAPILLARY: 122 mg/dL — AB (ref 65–99)
Glucose-Capillary: 113 mg/dL — ABNORMAL HIGH (ref 65–99)
Glucose-Capillary: 113 mg/dL — ABNORMAL HIGH (ref 65–99)
Glucose-Capillary: 137 mg/dL — ABNORMAL HIGH (ref 65–99)

## 2014-11-09 MED ORDER — METOCLOPRAMIDE HCL 5 MG PO TABS
5.0000 mg | ORAL_TABLET | Freq: Three times a day (TID) | ORAL | Status: DC
  Administered 2014-11-09 – 2014-11-12 (×9): 5 mg via ORAL
  Filled 2014-11-09 (×11): qty 1

## 2014-11-09 MED ORDER — AMIODARONE HCL 200 MG PO TABS
200.0000 mg | ORAL_TABLET | Freq: Every day | ORAL | Status: DC
  Administered 2014-11-10 – 2014-11-12 (×3): 200 mg via ORAL
  Filled 2014-11-09 (×3): qty 1

## 2014-11-09 NOTE — Progress Notes (Signed)
Following patient sidelines. Will decrease amiodarone to 200 mg q daily. I will follow patient in the office and transition him back to CCB /BB . Do not think CCB contributed to ilieus, but probably better to not start now until stabilized.   Scheduled Meds: . allopurinol  300 mg Oral q morning - 10a  . [START ON 11/10/2014] amiodarone  200 mg Oral Daily  . calcium carbonate  1 tablet Oral Once  . erythromycin  250 mg Intravenous 3 times per day  . insulin glargine  20 Units Subcutaneous QHS  . metoCLOPramide  5 mg Oral TID AC  . rivaroxaban  20 mg Oral Q supper  . rosuvastatin  10 mg Oral QPM   Continuous Infusions: . dextrose 5 % and 0.45% NaCl 50 mL/hr at 11/08/14 0159   PRN Meds:.acetaminophen **OR** acetaminophen, alum & mag hydroxide-simeth, menthol-cetylpyridinium **OR** phenol, ondansetron **OR** ondansetron (ZOFRAN) IV, promethazine

## 2014-11-09 NOTE — Progress Notes (Signed)
[facility] PROGRESS NOTE Interim History: 79 y.o. M with history of DM, HTN, GERD, DJD, A fib on Xarelto, was admitted on 5/21 after suffering a fall fall while at the grocery store. He lost balance and fell which resulted in left hip fracture. He was admitted and underwent hip repair on 10/18/2014. His post operative course has been complicated by A fib with RVR requiring cardizem drip and SDU transfer, and he has been alternating between A fib and sinus rhythm. Dr. Einar Gip with cardiology has been consulted and is following patient while hospitalized. Patient's cardizem was changed over to Amiodarone on 6/6. Postoperatively he developed also a severe ileus, which required decompression x 2, has been very slow to improve, thus his cardizem gtt was discontinued as it can contribute. Cardiology, GI and General Surgery are following.   HPI/Subjective: -NT tube was discontinued on 11/08/2014, thus far has been tolerating clears. He reports having several bowel movements yesterday along with passing flatus. Today he ambulated down the hallway with his walker. Overall looking better  Assessment/Plan:  Ileus, postoperative - s/p decompression with rectal tube and NG on 5/25, subsequently improving, tubes were removed and his diet was advanced. His ileus started to get worse again and patient underwent a colonoscopy for decompression on 6/3 and rectal tube and NG. Currently his rectal tube has been removed as he appears to be improving and NG tube is still in.  - mobilize with PT - all his narcotics were discontinued on 6/2, pain controlled without - Cont Reglan, continue IVF, erythromycin - aggressive lytes repletion, Keep mag. > 2.0 and K >4.0. - repeat abdominal film with persistent gaseous distention of small bowel loops, significant bowel wall thickening of cecum and ascending colon  -His NG tube was pulled on 11/08/2014 after which diet advanced to full liquids.  -Potassium at 4.1 on  6/10 -On 6/11 he appears to be tolerating clears, will plan to slowly advance diet  Atrial fibrillation with RVR: -He was transitioned to Amiodarone 200 mg PO BID -Remains in NSR  Hypokalemia -After potassium supplementation, his potassium improved to 4.1 on 11/09/2014  Left closed hip fracture: - Orthopedic surgery was consulted, s/p trochanteric IM nailing performed on 10/18/2014. - Physical therapy was consulted which recommended skilled nursing facility - Cont physical therapy -He ambulated down the hallway  Cognitive impairment: -Patient having a few episodes of increase confusion mainly at nighttime -He is currently stable, would recommend mental status exam in the outpatient setting once acute issues have resolved  Acute on Chronic kidney see stage III: - Most likely prerenal due to decreased oral intake. -A.m. lab work showing stable creatinine of 1.24  Diabetes mellitus type 2 in obese: -His blood sugars are well-controlled on the low 100 range -Continue Lantus 20 units subcutaneous  Essential  Hypertension -Stable   Code Status: Full Code Family Communication: I spoke to his wife who was present at bedside Disposition Plan: SNF   Consultants:  GI  Cardiology   General surgery  Procedures:  abd x-ray  Colonoscopy 5.26.2016: Multiple short discrete areas of ischemia in the sigmoid descending and transverse with normal bowel in between an air and fluid suctioned as above and no other obvious findings in unprepped exam  CT abd and pelvis 5.26.2016: Decompressed stomach, small bowel and colon following NG tube  insertion and decompression colonoscopy.  Antibiotics:  None  Objective: Filed Vitals:   11/08/14 0406 11/08/14 1354 11/08/14 2008 11/09/14 0436  BP: 138/78 117/61 119/62 125/79  Pulse: 93 91 84 93  Temp: 98.4 F (36.9 C) 98 F (36.7 C) 99.2 F (37.3 C) 97.9 F (36.6 C)  TempSrc: Oral Oral Oral Oral  Resp: 18 17 18 18   Height:       Weight:      SpO2: 96% 96% 96% 93%    Intake/Output Summary (Last 24 hours) at 11/09/14 1319 Last data filed at 11/09/14 0900  Gross per 24 hour  Intake    240 ml  Output    725 ml  Net   -485 ml   Filed Weights   10/24/14 1442 11/04/14 1600  Weight: 101.606 kg (224 lb) 96.389 kg (212 lb 8 oz)   Exam: General: NAD HEENT: No bruits, no goiter.  Heart: Regular rate and rhythm. Lungs: Good air movement,clear Abdomen: Patient having a soft abdomen, bowel sounds were heard in all 4 quadrants, abdomen nondistended, nontender Skin: no rashes Neuro: non focal, alert and oriented x 3 Psych: normal mood and affect  Data Reviewed: Basic Metabolic Panel:  Recent Labs Lab 11/04/14 1425 11/05/14 0436 11/06/14 0500 11/07/14 0444 11/08/14 0209  NA 129* 132* 133* 135 133*  K 2.8* 3.2* 3.1* 3.2* 4.1  CL 93* 100* 101 103 103  CO2 20* 21* 21* 23 20*  GLUCOSE 376* 87 115* 101* 124*  BUN 14 11 12 9 9   CREATININE 1.32* 1.37* 1.30* 1.24 1.24  CALCIUM 8.4* 8.8* 8.5* 8.5* 9.1  MG 1.6* 1.6* 2.0  --   --   PHOS  --   --  3.2  --   --     CBC:  Recent Labs Lab 11/04/14 0427 11/05/14 0436 11/06/14 0500 11/07/14 0444 11/08/14 0209  WBC 14.7* 13.6* 10.9* 10.3 10.9*  HGB 11.2* 10.2* 10.6* 10.3* 11.8*  HCT 33.9* 30.8* 31.2* 30.2* 34.8*  MCV 93.4 91.9 91.2 91.5 91.1  PLT 432* 392 375 379 376    CBG:  Recent Labs Lab 11/08/14 2005 11/08/14 2355 11/09/14 0435 11/09/14 0800 11/09/14 1108  GLUCAP 163* 113* 113* 137* 122*    No results found for this or any previous visit (from the past 240 hour(s)).  Studies: Ct Abdomen Pelvis W Contrast 11/04/2014 1. Although there is recurrent dilatation of the mid small bowel with multiple air-fluid levels, the findings are favored to reflect a regional small bowel ileus, as there is no transition point to suggest frank obstruction, and no dilatation of the more proximal small bowel (although there is a nasogastric tube in place). 2.  Multifocal thickening of the colonic wall, suggestive of colitis. 3. Normal appendix. 4. Small hiatal hernia. 5. Atherosclerosis. 6. Additional incidental findings, as above.   Dg Abd 2 Views 11/04/2014 Persistent severely distended small bowel with paucity of intra colonic bowel gas. These findings are consistent with small bowel obstruction. NG tube tip noted below the hemidiaphragms.    Dg Abd 2 Views 11/03/2014 Significant bowel wall thickening of cecum and ascending colon highly suspicious for colitis.  Persistent gaseous distention of small bowel loops which could be related to obstruction or ileus.    Scheduled Meds: . allopurinol  300 mg Oral q morning - 10a  . amiodarone  400 mg Oral Daily  . calcium carbonate  1 tablet Oral Once  . erythromycin  250 mg Intravenous 3 times per day  . insulin glargine  20 Units Subcutaneous QHS  . metoCLOPramide  5 mg Oral TID AC  . rivaroxaban  20 mg Oral Q supper  . rosuvastatin  10 mg Oral QPM   Continuous Infusions: . sodium chloride 50 mL/hr at 11/09/14 0104  . dextrose 5 % and 0.45% NaCl 50 mL/hr at 11/08/14 0159   Time spent: 25 minutes  Kelvin Cellar  [facility] Pager [phone]. If 7PM-7AM, please contact night-coverage at [email or web address], password [facility] 11/09/2014, 1:19 PM  LOS: 21 days

## 2014-11-09 NOTE — Progress Notes (Signed)
Patient ID: Howard Charles, male   DOB: March 30, 1936, 79 y.o.   MRN: [id number] [facility] Gastroenterology Progress Note  Howard Charles 79 y.o. [birth year]   Subjective: Lying in bedside chair. Reports several stools in the last hour that are more formed than yesterday but continue to be a small amount each time. Denies abdominal pain. Tolerating clear liquids.  Objective: Vital signs in last 24 hours: Filed Vitals:   11/09/14 1344  BP: 122/77  Pulse: 79  Temp: 98.1 F (36.7 C)  Resp: 18    Physical Exam: Gen: alert, no acute distress, elderly CV: RRR Chest: CTA B Abd: soft, nontender, nondistended, +BS  Lab Results:  Recent Labs  11/07/14 0444 11/08/14 0209  NA 135 133*  K 3.2* 4.1  CL 103 103  CO2 23 20*  GLUCOSE 101* 124*  BUN 9 9  CREATININE 1.24 1.24  CALCIUM 8.5* 9.1   No results for input(s): AST, ALT, ALKPHOS, BILITOT, PROT, ALBUMIN in the last 72 hours.  Recent Labs  11/07/14 0444 11/08/14 0209  WBC 10.3 10.9*  HGB 10.3* 11.8*  HCT 30.2* 34.8*  MCV 91.5 91.1  PLT 379 376   No results for input(s): LABPROT, INR in the last 72 hours.    Assessment/Plan: Postop ileus slowly resolving. Replace electrolytes as needed. Continue supportive care. Change to full liquids.   [place] C. 11/09/2014, 3:01 PM  Pager [phone]  If no answer or after 5 PM call [phone]

## 2014-11-10 LAB — BASIC METABOLIC PANEL
Anion gap: 9 (ref 5–15)
BUN: 9 mg/dL (ref 6–20)
CO2: 22 mmol/L (ref 22–32)
Calcium: 9 mg/dL (ref 8.9–10.3)
Chloride: 103 mmol/L (ref 101–111)
Creatinine, Ser: 1.34 mg/dL — ABNORMAL HIGH (ref 0.61–1.24)
GFR calc Af Amer: 57 mL/min — ABNORMAL LOW (ref >60)
GFR calc non Af Amer: 49 mL/min — ABNORMAL LOW (ref >60)
Glucose, Bld: 110 mg/dL — ABNORMAL HIGH (ref 65–99)
POTASSIUM: 3.6 mmol/L (ref 3.5–5.1)
Sodium: 134 mmol/L — ABNORMAL LOW (ref 135–145)

## 2014-11-10 MED ORDER — POTASSIUM CHLORIDE CRYS ER 20 MEQ PO TBCR
40.0000 meq | EXTENDED_RELEASE_TABLET | Freq: Once | ORAL | Status: AC
  Administered 2014-11-10: 40 meq via ORAL
  Filled 2014-11-10: qty 2

## 2014-11-10 NOTE — Progress Notes (Signed)
[facility] PROGRESS NOTE Interim History: 79 y.o. M with history of DM, HTN, GERD, DJD, A fib on Xarelto, was admitted on 5/21 after suffering a fall fall while at the grocery store. He lost balance and fell which resulted in left hip fracture. He was admitted and underwent hip repair on 10/18/2014. His post operative course has been complicated by A fib with RVR requiring cardizem drip and SDU transfer, and he has been alternating between A fib and sinus rhythm. Dr. Einar Gip with cardiology has been consulted and is following patient while hospitalized. Patient's cardizem was changed over to Amiodarone on 6/6. Postoperatively he developed also a severe ileus, which required decompression x 2, has been very slow to improve, thus his cardizem gtt was discontinued as it can contribute. Cardiology, GI and General Surgery are following.   HPI/Subjective: -Patient remained stable, this morning he was out of bed to chair, tolerating full liquid diet.   Assessment/Plan:  Ileus, postoperative - s/p decompression with rectal tube and NG on 5/25, subsequently improving, tubes were removed and his diet was advanced. His ileus started to get worse again and patient underwent a colonoscopy for decompression on 6/3 and rectal tube and NG. Currently his rectal tube has been removed as he appears to be improving and NG tube is still in.  - mobilize with PT - all his narcotics were discontinued on 6/2, pain controlled without - Cont Reglan, continue IVF, erythromycin - aggressive lytes repletion, Keep mag. > 2.0 and K >4.0. - repeat abdominal film with persistent gaseous distention of small bowel loops, significant bowel wall thickening of cecum and ascending colon  -His NG tube was pulled on 11/08/2014 after which diet advanced to full liquids.  -He continues to tolerate full liquids, plan to advance his diet in the next 24 hours -Will give 40 mEq of PO K as a.m. labs show potassium 3.6  Atrial  fibrillation with RVR: -On amiodarone 200 mg by mouth daily  Hypokalemia -A for keeping potassium greater than 4.0, 40 mEq of kdur given today.  Left closed hip fracture: - Orthopedic surgery was consulted, s/p trochanteric IM nailing performed on 10/18/2014. - Physical therapy was consulted which recommended skilled nursing facility - Cont physical therapy -He ambulated down the hallway  Cognitive impairment: -Patient having a few episodes of increase confusion mainly at nighttime -He is currently stable, would recommend mental status exam in the outpatient setting once acute issues have resolved  Acute on Chronic kidney see stage III: - Most likely prerenal due to decreased oral intake. -Labs showing stable creatinine.  Diabetes mellitus type 2 in obese: -His blood sugars are well-controlled on the low 100 range -Continue Lantus 20 units subcutaneous  Essential  Hypertension -Stable   Code Status: Full Code Family Communication: I spoke to his wife who was present at bedside Disposition Plan: Anticipate discharge in the next 24-48 hours if he remains stable to skilled nursing facility   Consultants:  GI  Cardiology   General surgery  Procedures:  abd x-ray  Colonoscopy 5.26.2016: Multiple short discrete areas of ischemia in the sigmoid descending and transverse with normal bowel in between an air and fluid suctioned as above and no other obvious findings in unprepped exam  CT abd and pelvis 5.26.2016: Decompressed stomach, small bowel and colon following NG tube  insertion and decompression colonoscopy.  Antibiotics:  None  Objective: Filed Vitals:   11/09/14 0436 11/09/14 1344 11/09/14 1913 11/10/14 0405  BP: 125/79 122/77 117/61 112/73  Pulse: 93 79 88 98  Temp: 97.9 F (36.6 C) 98.1 F (36.7 C) 98.9 F (37.2 C) 98.5 F (36.9 C)  TempSrc: Oral Oral Oral Oral  Resp: 18 18 18 18   Height:      Weight:      SpO2: 93% 94% 96% 95%    Intake/Output  Summary (Last 24 hours) at 11/10/14 1226 Last data filed at 11/10/14 0920  Gross per 24 hour  Intake    596 ml  Output   1100 ml  Net   -504 ml   Filed Weights   10/24/14 1442 11/04/14 1600  Weight: 101.606 kg (224 lb) 96.389 kg (212 lb 8 oz)   Exam: General: NAD, he is sitting at bedside chair, states feeling well, tolerating by mouth fluids HEENT: No bruits, no goiter.  Heart: Regular rate and rhythm. Lungs: Good air movement,clear Abdomen: Patient having a soft abdomen, bowel sounds were heard in all 4 quadrants, abdomen nondistended, nontender Skin: no rashes Neuro: non focal, alert and oriented x 3 Psych: normal mood and affect  Data Reviewed: Basic Metabolic Panel:  Recent Labs Lab 11/04/14 1425 11/05/14 0436 11/06/14 0500 11/07/14 0444 11/08/14 0209 11/10/14 0435  NA 129* 132* 133* 135 133* 134*  K 2.8* 3.2* 3.1* 3.2* 4.1 3.6  CL 93* 100* 101 103 103 103  CO2 20* 21* 21* 23 20* 22  GLUCOSE 376* 87 115* 101* 124* 110*  BUN 14 11 12 9 9 9   CREATININE 1.32* 1.37* 1.30* 1.24 1.24 1.34*  CALCIUM 8.4* 8.8* 8.5* 8.5* 9.1 9.0  MG 1.6* 1.6* 2.0  --   --   --   PHOS  --   --  3.2  --   --   --     CBC:  Recent Labs Lab 11/04/14 0427 11/05/14 0436 11/06/14 0500 11/07/14 0444 11/08/14 0209  WBC 14.7* 13.6* 10.9* 10.3 10.9*  HGB 11.2* 10.2* 10.6* 10.3* 11.8*  HCT 33.9* 30.8* 31.2* 30.2* 34.8*  MCV 93.4 91.9 91.2 91.5 91.1  PLT 432* 392 375 379 376    CBG:  Recent Labs Lab 11/08/14 2005 11/08/14 2355 11/09/14 0435 11/09/14 0800 11/09/14 1108  GLUCAP 163* 113* 113* 137* 122*    No results found for this or any previous visit (from the past 240 hour(s)).  Studies: Ct Abdomen Pelvis W Contrast 11/04/2014 1. Although there is recurrent dilatation of the mid small bowel with multiple air-fluid levels, the findings are favored to reflect a regional small bowel ileus, as there is no transition point to suggest frank obstruction, and no dilatation of the more  proximal small bowel (although there is a nasogastric tube in place). 2. Multifocal thickening of the colonic wall, suggestive of colitis. 3. Normal appendix. 4. Small hiatal hernia. 5. Atherosclerosis. 6. Additional incidental findings, as above.   Dg Abd 2 Views 11/04/2014 Persistent severely distended small bowel with paucity of intra colonic bowel gas. These findings are consistent with small bowel obstruction. NG tube tip noted below the hemidiaphragms.    Dg Abd 2 Views 11/03/2014 Significant bowel wall thickening of cecum and ascending colon highly suspicious for colitis.  Persistent gaseous distention of small bowel loops which could be related to obstruction or ileus.    Scheduled Meds: . allopurinol  300 mg Oral q morning - 10a  . amiodarone  200 mg Oral Daily  . calcium carbonate  1 tablet Oral Once  . insulin glargine  20 Units Subcutaneous QHS  . metoCLOPramide  5  mg Oral TID AC  . rivaroxaban  20 mg Oral Q supper  . rosuvastatin  10 mg Oral QPM   Continuous Infusions: . dextrose 5 % and 0.45% NaCl 50 mL/hr at 11/08/14 0159   Time spent: 20 minutes  Kelvin Cellar  [facility] Pager [phone]. If 7PM-7AM, please contact night-coverage at [email or web address], password [facility] 11/10/2014, 12:26 PM  LOS: 22 days

## 2014-11-11 LAB — GLUCOSE, CAPILLARY: Glucose-Capillary: 126 mg/dL — ABNORMAL HIGH (ref 65–99)

## 2014-11-11 MED ORDER — METOPROLOL TARTRATE 25 MG PO TABS
25.0000 mg | ORAL_TABLET | Freq: Three times a day (TID) | ORAL | Status: DC
  Administered 2014-11-11 – 2014-11-12 (×3): 25 mg via ORAL
  Filled 2014-11-11 (×6): qty 1

## 2014-11-11 MED ORDER — DILTIAZEM HCL 25 MG/5ML IV SOLN
5.0000 mg | Freq: Once | INTRAVENOUS | Status: DC
  Filled 2014-11-11: qty 5

## 2014-11-11 NOTE — Care Management Note (Signed)
Case Management Note  Patient Details  Name: Howard Charles MRN: [id number] Date of Birth: 1936/05/10  Subjective/Objective:    Pt admitted with Closed Hip Fracture, s/p hip fracture repair pt developed ileus.                 Action/Plan:  Pt has undergone multiple decompression colonoscopies with minimal improvement.  CM will continue to monitor for disposition needs   Expected Discharge Date:                  Expected Discharge Plan:  [place]  In-House Referral:  Clinical Social Work  Discharge planning Services  CM Consult  Post Acute Care Choice:    Choice offered to:     DME Arranged:    DME Agency:     HH Arranged:    [place] Agency:     Status of Service:  In process, will continue to follow  Medicare Important Message Given: Yes Medicare IM give by: [name] Date Given: 11/01/14  Date Additional Medicare IM Given:  11/11/14 Additional Medicare Important Message give by:  [name]  If discussed at Long Length of Stay Meetings, dates discussed:  11/07/14  Additional Comments:  [name], RN 11/11/2014, 2:01 PM

## 2014-11-11 NOTE — Progress Notes (Signed)
[facility] PROGRESS NOTE Interim History: 79 y.o. M with history of DM, HTN, GERD, DJD, A fib on Xarelto, was admitted on 5/21 after suffering a fall fall while at the grocery store. He lost balance and fell which resulted in left hip fracture. He was admitted and underwent hip repair on 10/18/2014. His post operative course has been complicated by A fib with RVR requiring cardizem drip and SDU transfer, and he has been alternating between A fib and sinus rhythm. Dr. Einar Gip with cardiology has been consulted and is following patient while hospitalized. Patient's cardizem was changed over to Amiodarone on 6/6. Postoperatively he developed also a severe ileus, which required decompression x 2, has been very slow to improve, thus his cardizem gtt was discontinued as it can contribute. Cardiology, GI and General Surgery are following.   HPI/Subjective: -Patient remained stable, this morning he was out of bed to chair, tolerating full liquid diet.   Assessment/Plan:  Ileus, postoperative - s/p decompression with rectal tube and NG on 5/25, subsequently improving, tubes were removed and his diet was advanced. His ileus started to get worse again and patient underwent a colonoscopy for decompression on 6/3 and rectal tube and NG. Currently his rectal tube has been removed as he appears to be improving and NG tube is still in.  - mobilize with PT - all his narcotics were discontinued on 6/2, pain controlled without - Cont Reglan, continue IVF, erythromycin - aggressive lytes repletion, Keep mag. > 2.0 and K >4.0. - repeat abdominal film with persistent gaseous distention of small bowel loops, significant bowel wall thickening of cecum and ascending colon  -His NG tube was pulled on 11/08/2014 after which diet advanced to full liquids.  -Advanced diet to Heart Healthy today, anticipate discharge in the next 24 hours if he tolerates diet  Atrial fibrillation with RVR: -RN reporting episodes of  AFib with RVR, case was discussed with his cardiologist Dr Einar Gip who recommended starting Metoprolol 25 mg PO TID -On amiodarone 200 mg by mouth daily  Left closed hip fracture: - Orthopedic surgery was consulted, s/p trochanteric IM nailing performed on 10/18/2014. - Physical therapy was consulted which recommended skilled nursing facility - Cont physical therapy -He ambulated down the hallway  Cognitive impairment: -Patient having a few episodes of increase confusion mainly at nighttime -He is currently stable, would recommend mental status exam in the outpatient setting once acute issues have resolved  Acute on Chronic kidney see stage III: - Most likely prerenal due to decreased oral intake. -Labs showing stable creatinine.  Diabetes mellitus type 2 in obese: -His blood sugars are well-controlled on the low 100 range -Continue Lantus 20 units subcutaneous  Essential  Hypertension -Stable   Code Status: Full Code Family Communication: I spoke to his wife who was present at bedside Disposition Plan: Anticipate discharge in the next 24 hours if tolerating diet and HR's stable  Consultants:  GI  Cardiology   General surgery  Procedures:  abd x-ray  Colonoscopy 5.26.2016: Multiple short discrete areas of ischemia in the sigmoid descending and transverse with normal bowel in between an air and fluid suctioned as above and no other obvious findings in unprepped exam  CT abd and pelvis 5.26.2016: Decompressed stomach, small bowel and colon following NG tube  insertion and decompression colonoscopy.  Antibiotics:  None  Objective: Filed Vitals:   11/10/14 1927 11/11/14 0446 11/11/14 1210 11/11/14 1300  BP: 117/62 116/72 109/77 124/67  Pulse: 89 95  95  Temp: 98.7 F (37.1 C) 98.2 F (36.8 C)  98.2 F (36.8 C)  TempSrc: Oral Oral  Oral  Resp: 16 18  18   Height:      Weight:      SpO2: 95% 95%  95%    Intake/Output Summary (Last 24 hours) at 11/11/14  1735 Last data filed at 11/11/14 1300  Gross per 24 hour  Intake   1036 ml  Output    250 ml  Net    786 ml   Filed Weights   10/24/14 1442 11/04/14 1600  Weight: 101.606 kg (224 lb) 96.389 kg (212 lb 8 oz)   Exam: General: NAD, he is sitting at bedside chair, states feeling well, tolerating by mouth fluids HEENT: No bruits, no goiter.  Heart: Regular rate and rhythm. Lungs: Good air movement,clear Abdomen: Patient having a soft abdomen, bowel sounds were heard in all 4 quadrants, abdomen nondistended, nontender Skin: no rashes Neuro: non focal, alert and oriented x 3 Psych: normal mood and affect  Data Reviewed: Basic Metabolic Panel:  Recent Labs Lab 11/05/14 0436 11/06/14 0500 11/07/14 0444 11/08/14 0209 11/10/14 0435  NA 132* 133* 135 133* 134*  K 3.2* 3.1* 3.2* 4.1 3.6  CL 100* 101 103 103 103  CO2 21* 21* 23 20* 22  GLUCOSE 87 115* 101* 124* 110*  BUN 11 12 9 9 9   CREATININE 1.37* 1.30* 1.24 1.24 1.34*  CALCIUM 8.8* 8.5* 8.5* 9.1 9.0  MG 1.6* 2.0  --   --   --   PHOS  --  3.2  --   --   --     CBC:  Recent Labs Lab 11/05/14 0436 11/06/14 0500 11/07/14 0444 11/08/14 0209  WBC 13.6* 10.9* 10.3 10.9*  HGB 10.2* 10.6* 10.3* 11.8*  HCT 30.8* 31.2* 30.2* 34.8*  MCV 91.9 91.2 91.5 91.1  PLT 392 375 379 376    CBG:  Recent Labs Lab 11/08/14 2005 11/08/14 2355 11/09/14 0435 11/09/14 0800 11/09/14 1108  GLUCAP 163* 113* 113* 137* 122*    No results found for this or any previous visit (from the past 240 hour(s)).  Studies: Ct Abdomen Pelvis W Contrast 11/04/2014 1. Although there is recurrent dilatation of the mid small bowel with multiple air-fluid levels, the findings are favored to reflect a regional small bowel ileus, as there is no transition point to suggest frank obstruction, and no dilatation of the more proximal small bowel (although there is a nasogastric tube in place). 2. Multifocal thickening of the colonic wall, suggestive of colitis. 3.  Normal appendix. 4. Small hiatal hernia. 5. Atherosclerosis. 6. Additional incidental findings, as above.   Dg Abd 2 Views 11/04/2014 Persistent severely distended small bowel with paucity of intra colonic bowel gas. These findings are consistent with small bowel obstruction. NG tube tip noted below the hemidiaphragms.    Dg Abd 2 Views 11/03/2014 Significant bowel wall thickening of cecum and ascending colon highly suspicious for colitis.  Persistent gaseous distention of small bowel loops which could be related to obstruction or ileus.    Scheduled Meds: . allopurinol  300 mg Oral q morning - 10a  . amiodarone  200 mg Oral Daily  . calcium carbonate  1 tablet Oral Once  . diltiazem  5 mg Intravenous Once  . insulin glargine  20 Units Subcutaneous QHS  . metoCLOPramide  5 mg Oral TID AC  . metoprolol tartrate  25 mg Oral 3 times per day  . rivaroxaban  20  mg Oral Q supper  . rosuvastatin  10 mg Oral QPM   Continuous Infusions: . dextrose 5 % and 0.45% NaCl 50 mL/hr at 11/08/14 0159   Time spent: 20 minutes  Kelvin Cellar  [facility] Pager [phone]. If 7PM-7AM, please contact night-coverage at [email or web address], password [facility] 11/11/2014, 5:35 PM  LOS: 23 days

## 2014-11-11 NOTE — Progress Notes (Signed)
Pt ambulated to restroom and HR went into afib in the 170s-180s. Pt stated he did not feel anything and was asymptomatic. When patient returned to bed HR went back to SR in the 90s.   Will continue to monitor.   Howard Charles

## 2014-11-11 NOTE — Progress Notes (Signed)
Utilization review completed.  

## 2014-11-11 NOTE — Progress Notes (Signed)
Occupational Therapy Treatment Patient Details Name: Howard Charles MRN: [id number] DOB: 1935-08-27 Today's Date: 11/11/2014    History of present illness Pt is a 79 y.o. M s/p fall and L hip fx.  Pt's PMH includes DM, diabetic neuropathy, gout, HTN, B TKA, B hearing loss, chronic renal insufficiency, on supplemental O2 at night, a fib, rotator cuff arthroscopy.   OT comments  Pt progressing. Able to ambulate in hallway with OT. HR up to high 150s in session. Nurse notified.   Follow Up Recommendations  SNF;Supervision/Assistance - 24 hour    Equipment Recommendations  None recommended by OT    Recommendations for Other Services      Precautions / Restrictions Precautions Precautions: Fall Precaution Comments: monitor HR Restrictions Weight Bearing Restrictions: Yes LLE Weight Bearing: Weight bearing as tolerated       Mobility Bed Mobility               General bed mobility comments: not assessed  Transfers Overall transfer level: Needs assistance   Transfers: Sit to/from Stand Sit to Stand: Mod assist;+2 physical assistance         General transfer comment: assist to boost. Assist to control descent to toilet +1 assist for stand to sit to commode. Cues for hand placement.    Balance   assist for balance with sit to stand transfer until hand placed on walker. Balance not formally assessed.                                 ADL Overall ADL's : Needs assistance/impaired                         Toilet Transfer: Min guard;+2 for safety/equipment;Ambulation;RW;Moderate assistance;+2 for physical assistance (+2 Mod A for sit to stand from chair; assist to control descent to toilet)   [place] and Hygiene:  (standing-wife assisted with majority of this task- feel pt could have done more)       Functional mobility during ADLs: Min guard;+2 for safety/equipment;Rolling walker General ADL Comments: Pt ambulated in  hallway. Cues for deep breathing technique in session.      Vision                     Perception     Praxis      Cognition  Awake/Alert Behavior During Therapy: WFL for tasks assessed/performed Overall Cognitive Status: History of cognitive impairments - at baseline                       Extremity/Trunk Assessment                  Shoulder Instructions       General Comments      Pertinent Vitals/ Pain       Pain Assessment: 0-10 Pain Score:  (3-5) Faces Pain Scale: Hurts little more Pain Location: Lt hip Pain Intervention(s): Monitored during session   HR up to high 150s in session. Trended down. Cues for deep breathing.  Home Living                                          Prior Functioning/Environment              Frequency  Min 2X/week     Progress Toward Goals  OT Goals(current goals can now be found in the care plan section)  Progress towards OT goals: Progressing toward goals  Acute Rehab OT Goals Patient Stated Goal: not stated OT Goal Formulation: With patient Time For Goal Achievement: 11/12/14 Potential to Achieve Goals: Good ADL Goals  Plan Discharge plan remains appropriate    Co-evaluation                 End of Session Equipment Utilized During Treatment: Gait belt;Rolling walker   Activity Tolerance Patient tolerated treatment well   Patient Left with call bell/phone within reach;with family/visitor present;Other (comment) (on toilet)   Nurse Communication Other (comment) (HR)        Time: 6803-2122 OT Time Calculation (min): 13 min  Charges: OT General Charges $OT Visit: 1 Procedure OT Treatments $Therapeutic Activity: 8-22 mins   Benito Mccreedy OTR/L [phone] 11/11/2014, 3:12 PM

## 2014-11-11 NOTE — Progress Notes (Addendum)
Pt converted to afib and HR jumps between 80s-140s. Pt resting comfortably. Retail banker, PA.  Will continue to monitor.   Richarda Blade R   [phone]  Pt converted back to sinus rhythm. Frequent PACs  [address] return page. Ordered to notify cards. Paged Dr. Karlyn Agee concerning HR and rhythm changes.   Earlie Lou

## 2014-11-11 NOTE — Progress Notes (Signed)
Per dr pt will likely be ready for DC to SNF tomorrow (6/14)- CSW contacted [facility] to confirm bed availability- they are able to accept pt tomorrow.  CSW will continue to follow.  Domenica Reamer, [place] Social Worker [phone]

## 2014-11-11 NOTE — Progress Notes (Signed)
ANTICOAGULATION CONSULT NOTE - Follow Up Consult  Pharmacy Consult for xarelto Indication: atrial fibrillation  No Known Allergies  Patient Measurements: Height: 5\' 10"  (177.8 cm) Weight: 212 lb 8 oz (96.389 kg) (wife request) IBW/kg (Calculated) : 73   Vital Signs: Temp: 98.2 F (36.8 C) (06/13 0446) Temp Source: Oral (06/13 0446) BP: 116/72 mmHg (06/13 0446) Pulse Rate: 95 (06/13 0446)  Labs:  Recent Labs  11/10/14 0435  CREATININE 1.34*    Estimated Creatinine Clearance: 53.8 mL/min (by C-G formula based on Cr of 1.34).  Assessment: 79 yo male with hx afib on xarelto PTA. He is s/p fall with hip fracture and repair on 5/22. Was NPO post-op d/t ileus and was continued on IV heparin. Xarelto resumed 6/10 and patient has been tolerating. Diet was advanced to heart healthy this morning.   Last CBC from 6/10- hgb 11.8, plts 376- no overt bleeding noted SCr remains stable at 1.34, CrCl ~50-31mL/min.  Plan:  -Xarelto 20mg  PO qsupper as patient tolerating and renal function is stable -pharmacy to sign off consult. Will follow peripherally. Please re-consult if needed.  Jari Carollo D. Soni Kegel, PharmD, BCPS Clinical Pharmacist Pager: [phone] 11/11/2014 10:38 AM

## 2014-11-11 NOTE — Progress Notes (Signed)
Physical Therapy Treatment Patient Details Name: NEERAJ HOUSAND MRN: [id number] DOB: 1935/07/14 Today's Date: 11/11/2014    History of Present Illness Pt is a 79 y.o. M s/p fall and L hip fx.  Pt's PMH includes DM, diabetic neuropathy, gout, HTN, B TKA, B hearing loss, chronic renal insufficiency, on supplemental O2 at night, a fib, rotator cuff arthroscopy.    PT Comments    Pt with HR 168 after scooting forward to front of recliner.  Deferred further mobility.  Did perform there ex to increase strength and ROM of L LE which is needed to improve overall functional mobility.  HR fluctuated between 104-145 with seated therex.  Upon leaving room, HR 166, at rest with nursing aware.  Nurse reports pt starting new med this afternoon.  Recommend PT tomorrow to attempt gait if HR is better controlled. Con't to recommend SNF.  Follow Up Recommendations  SNF;Supervision for mobility/OOB     Equipment Recommendations  Rolling walker with 5" wheels    Recommendations for Other Services       Precautions / Restrictions Precautions Precautions: Fall Precaution Comments: monitor HR Restrictions LLE Weight Bearing: Weight bearing as tolerated    Mobility  Bed Mobility               General bed mobility comments: Pt up in recliner upon arrival  Transfers                 General transfer comment: Put legs of recliner down and had pt scoot to front of chair.  checked HR and was 168.  Spoke with nursing and pt starting a new med this afternoon.  Deferred further mobility.  Ambulation/Gait                 Stairs            Wheelchair Mobility    Modified Rankin (Stroke Patients Only)       Balance                                    Cognition Arousal/Alertness: Awake/alert Behavior During Therapy: WFL for tasks assessed/performed Overall Cognitive Status: History of cognitive impairments - at baseline                       Exercises General Exercises - Lower Extremity Ankle Circles/Pumps: AROM;Both;10 reps;Seated Long Arc Quad: Left;10 reps;Seated Heel Slides: AROM;Left;10 reps;Other (comment) (reclined) Hip ABduction/ADduction: AAROM;AROM;Left;10 reps;Seated;Other (comment) (reclined) Hip Flexion/Marching: AROM;Both;10 reps;Seated    General Comments        Pertinent Vitals/Pain Pain Assessment: Faces Faces Pain Scale: Hurts little more Pain Location: L Leg with ther ex Pain Intervention(s): Monitored during session    Home Living                      Prior Function            PT Goals (current goals can now be found in the care plan section) Acute Rehab PT Goals PT Goal Formulation: With patient Time For Goal Achievement: 11/15/14 Potential to Achieve Goals: Good Progress towards PT goals: Not progressing toward goals - comment (unable to attempt ambulation today due to elevated HR)    Frequency  Min 3X/week    PT Plan Current plan remains appropriate    Co-evaluation  End of Session   Activity Tolerance: Treatment limited secondary to medical complications (Comment) (elevated HR) Patient left: in chair;with call bell/phone within reach     Time: 6948-5462 PT Time Calculation (min) (ACUTE ONLY): 18 min  Charges:  $Therapeutic Exercise: 8-22 mins                    G Codes:      Talayla Doyel LUBECK 11/11/2014, 12:20 PM

## 2014-11-12 MED ORDER — METOPROLOL TARTRATE 25 MG PO TABS: 25.0000 mg | ORAL_TABLET | Freq: Three times a day (TID) | ORAL | Status: AC

## 2014-11-12 MED ORDER — INSULIN GLARGINE 100 UNIT/ML ~~LOC~~ SOLN: 20.0000 [IU] | Freq: Every day | SUBCUTANEOUS | Status: AC

## 2014-11-12 MED ORDER — ACETAMINOPHEN 325 MG PO TABS: 650.0000 mg | ORAL_TABLET | Freq: Four times a day (QID) | ORAL | Status: DC | PRN

## 2014-11-12 MED ORDER — AMIODARONE HCL 200 MG PO TABS: 200.0000 mg | ORAL_TABLET | Freq: Every day | ORAL | Status: DC

## 2014-11-12 MED ORDER — METOCLOPRAMIDE HCL 5 MG PO TABS: 5.0000 mg | ORAL_TABLET | Freq: Three times a day (TID) | ORAL | Status: DC

## 2014-11-12 MED ORDER — OXYCODONE HCL 5 MG PO TABS: 5.0000 mg | ORAL_TABLET | Freq: Four times a day (QID) | ORAL | Status: DC | PRN

## 2014-11-12 MED ORDER — CALCIUM CARBONATE ANTACID 500 MG PO CHEW: 1.0000 | CHEWABLE_TABLET | Freq: Once | ORAL | Status: DC

## 2014-11-12 NOTE — Progress Notes (Signed)
Patient will discharge to [facility] Anticipated discharge date:11/12/14 Family notified: left message for pt wife Transportation by [facility]- scheduled for 1pm  CSW signing off.  Domenica Reamer, [place] Social Worker [phone]

## 2014-11-12 NOTE — Care Management Note (Signed)
Case Management Note  Patient Details  Name: Howard Charles MRN: [id number] Date of Birth: 07-12-1935  Subjective/Objective:    Pt admitted with Closed Hip Fracture, s/p hip fracture repair pt developed ileus.                 Action/Plan:  Pt has undergone multiple decompression colonoscopies with minimal improvement.  CM will continue to monitor for disposition needs   Expected Discharge Date:                  Expected Discharge Plan:  [place]  In-House Referral:  Clinical Social Work  Discharge planning Services  CM Consult  Post Acute Care Choice:    Choice offered to:     DME Arranged:    DME Agency:     HH Arranged:    [place] Agency:     Status of Service:  Complete, will sign off  Medicare Important Message Given: Yes Medicare IM give by: [name] Date Given: 11/01/14  Date Additional Medicare IM Given:  11/12/14 Additional Medicare Important Message give by:  [name]  If discussed at Long Length of Stay Meetings, dates discussed:  11/07/14  Additional Comments: Pt discharging to SNF today  [name], RN 11/12/2014, 10:44 AM

## 2014-11-12 NOTE — Progress Notes (Signed)
  Pharmacy Discharge Medication Therapy Review   Total Number of meds on admission ____10_____ (polypharmacy > 10 meds)  Indications for all medications: [x]  Yes       []  No  Adherence Review  []  Excellent (no doses missed/week)     []  Good (no more than 1 dose missed/week)     []  Partial (2-3 doses missed/week)     []  Poor (>3 doses missed/week)     []  Not assessed  Total number of high risk medications _2__ (Anticoagulants, Dual antiplatelets, oral Antihyperglycemic agents,Insulins, Antipsychotics, Anti-Seizure meds, Inhalers, HF/ACS meds, Antibiotics and HIV medications)   Assessment: (Medication related problems)  Intervention  YES NO  Explanation   Indications      Medication without noted indication []  [x]     Indication without noted medication [x]  []  Patient is a 82 YOM with PMH of diabetes and CKD III.  Patient previously on telmisartan-HCTZ (Micardis), however this was held during this inpatient encounter.  ACEI/ARB has not been continued post discharge due to labile BP and metoprolol 25 mg TID initiated recently.  Please consider initiating lisinopril 2.5 mg daily once blood pressures have remained stable.  Scr stable, K wnl, no allergies.     Duplicate therapy []  [x]    Efficacy      Suboptimal drug or dose selection []  [x]    Insufficient dose/duration []  [x]    Failure to receive therapy  (Rx not filled) []  [x]     Safety      Adverse drug event []  [x]     Drug interaction []  [x]     Excessive dose/duration []  [x]    High-risk medications []  [x]    Compliance     Underuse []  [x]     Overuse []  [x]    Other pertinent pharmacist counseling []  [x]      Total number of new medications upon discharge: ____6_____  Time:  Time spent preparing for discharge counseling: 10 min. Time spent counseling patient: 0 Additional time spent on discharge (specify): 5 min. Speaking to MD  PLAN:  To PCP - Consider the following at discharge/Recommendations:  - Add lisinopril 2.5 mg by  mouth daily once blood pressures have stabilized.  Hassie Bruce, Pharm. D. Clinical Pharmacy Resident Pager: [phone] Ph: [phone] 11/12/2014 9:15 AM

## 2014-11-12 NOTE — Discharge Summary (Addendum)
Physician Discharge Summary  Howard Charles MGQ:[id number] DOB: 1935-06-19 DOA: 10/19/2014  PCP: Horatio Pel, MD  Admit date: 10/19/2014 Discharge date: 06/05/2015  Time spent: 35 minutes  Recommendations for Outpatient Follow-up:  1. Please follow up on his heart rates, had issues with Afib with RVR during this hospitalization, discharged on Amiodarone and Metoprolol 2. BMP and CBC in 3-4 days 3. Follow up on blood sugars, they were controlled during this hospitalization on Lantus 20 units daily  Discharge Diagnoses:  Principal Problem:   Closed left hip fracture ([place]) Active Problems:   Diabetes mellitus type 2 in obese ([place])   Hypertension   Chronic renal insufficiency, stage III (moderate)   A-fib (HCC)   Prostate cancer (HCC)   Atrial fibrillation with RVR (HCC)   Ileus, postoperative   Ileus ([place])   Discharge Condition: Stable/Improved  Diet recommendation: Heart Healthy  Filed Weights   10/24/14 1442 11/04/14 1600  Weight: 101.606 kg (224 lb) 96.389 kg (212 lb 8 oz)    History of present illness:  Howard Charles is a 79 y.o. male   has a past medical history of Diabetes mellitus type 2 in obese; Diabetic neuropathy; Hypertension; GERD (gastroesophageal reflux disease); Gout; Left knee DJD; Right knee DJD; PONV (postoperative nausea and vomiting); Bilateral hearing loss; Chronic renal insufficiency, stage III (moderate); Dysrhythmia; and On supplemental oxygen therapy.   Presented with fall while at the grocery store, Patient lost balance and fell hitting his HEAD and left hip he was not able to get up. EMS was called and he arrived to ER. Plain imaging showed Intertochanteric fracture of the left hip. Patient with hx of A.fib on Xarelto last dose this AM and coreg. On arrival to ER he was tachycardic with HR up to 130 with A.fib he was given a dose of diltiazem and improved with HR down to 80's currently converted to sinus rhythm with heart rate of  70 Reports hx of DM on Lantus he checks his BG twice a day. Reports good control HG 6.9 He has been seen recently by his cardiologist and had a an echo done was told it was ok. NO records in Epic. Denies any hx of CAD. No hx of chest pain. Patient goes to the gym 2-4 days a week. Does yard work.  Reports he is on oxygen 2 L at night.  In ER Dr. Mardelle Matte has been consulted will plan to operate in AM  Hospital Course:  79 y.o. M with history of DM, HTN, GERD, DJD, A fib on Xarelto, was admitted on 5/21 after suffering a fall fall while at the grocery store. He lost balance and fell which resulted in left hip fracture. He was admitted and underwent hip repair on 10/18/2014. His post operative course has been complicated by A fib with RVR requiring cardizem drip and SDU transfer, and he has been alternating between A fib and sinus rhythm. Dr. Einar Gip with cardiology has been consulted and is following patient while hospitalized. Patient's cardizem was changed over to Amiodarone on 6/6. Postoperatively he developed also a severe ileus, which required decompression x 2, has been very slow to improve.  Ileus, postoperative - s/p decompression with rectal tube and NG on 5/25, subsequently improving, tubes were removed and his diet was advanced. His ileus started to get worse again and patient underwent a colonoscopy for decompression on 6/3 and rectal tube and NG. Currently his rectal tube has been removed as he appears to be improving and NG tube  is still in.  - all his narcotics were discontinued on 10/31/2014, pain controlled with tylenol -His NG tube was pulled on 11/08/2014 after which diet advanced   -On 11/11/2014 his diet was advanced to Heart Healthy which he tolerated well. Ileus resolved.  -Would recommend attempting to minimize narcotic analgesics if possible.   Atrial fibrillation with RVR: -RN reporting episodes of AFib with RVR, case was discussed with his cardiologist Dr Einar Gip on 11/11/2014 who  recommended starting Metoprolol 25 mg PO TID -Will continue amiodarone 200 mg by mouth daily  Left closed hip fracture: - Orthopedic surgery was consulted, s/p trochanteric IM nailing performed on 10/18/2014. - Physical therapy was consulted which recommended skilled nursing facility - Cont physical therapy -He ambulated down the hallway during this hospitalization -Will continue PT at SNF  Cognitive impairment: -Patient having a few episodes of increase confusion mainly at nighttime -He is currently stable, would recommend mental status exam in the outpatient setting once acute issues have resolved  Acute on Chronic kidney see stage III: - Most likely prerenal due to decreased oral intake. -Labs showing stable creatinine. On 11/10/2014 had creatinine of 1.34  Diabetes mellitus type 2 in obese: -His blood sugars are well-controlled on the low 100 range -Continue Lantus 20 units subcutaneous -Follow up on blood sugars  Essential Hypertension -Stable  Procedures:  Colonoscopy 5.26.2016: Multiple short discrete areas of ischemia in the sigmoid descending and transverse with normal bowel in between an air and fluid suctioned as above and no other obvious findings in unprepped exam  CT abd and pelvis 5.26.2016: Decompressed stomach, small bowel and colon following NG tube  insertion and decompression colonoscopy.  Intramedullary Nail Placement of left hip fracture on 10/20/2014  Consultations:  GI  Cardiology   General surgery  Discharge Exam: Filed Vitals:   11/12/14 0545 11/12/14 0602  BP: 135/71 126/76  Pulse: 76 83  Temp: 97.6 F (36.4 C)   Resp: 20     General: NAD, he is sitting at bedside chair, states feeling well, tolerating by mouth intake HEENT: No bruits, no goiter.  Heart: Regular rate and rhythm. Lungs: Good air movement,clear Abdomen: Patient having a soft abdomen, bowel sounds were heard in all 4 quadrants, abdomen nondistended, nontender Skin:  no rashes Neuro: non focal, alert and oriented x 3 Psych: normal mood and affect Discharge Instructions   Discharge Instructions    Call MD for:  difficulty breathing, headache or visual disturbances    Complete by:  As directed      Call MD for:  difficulty breathing, headache or visual disturbances    Complete by:  As directed      Call MD for:  extreme fatigue    Complete by:  As directed      Call MD for:  extreme fatigue    Complete by:  As directed      Call MD for:  hives    Complete by:  As directed      Call MD for:  hives    Complete by:  As directed      Call MD for:  persistant dizziness or light-headedness    Complete by:  As directed      Call MD for:  persistant dizziness or light-headedness    Complete by:  As directed      Call MD for:  persistant nausea and vomiting    Complete by:  As directed      Call MD for:  persistant nausea  and vomiting    Complete by:  As directed      Call MD for:  redness, tenderness, or signs of infection (pain, swelling, redness, odor or green/yellow discharge around incision site)    Complete by:  As directed      Call MD for:  redness, tenderness, or signs of infection (pain, swelling, redness, odor or green/yellow discharge around incision site)    Complete by:  As directed      Call MD for:  severe uncontrolled pain    Complete by:  As directed      Call MD for:  severe uncontrolled pain    Complete by:  As directed      Call MD for:  temperature >100.4    Complete by:  As directed      Call MD for:  temperature >100.4    Complete by:  As directed      Call MD for:    Complete by:  As directed      Diet - low sodium heart healthy    Complete by:  As directed      Diet - low sodium heart healthy    Complete by:  As directed      Increase activity slowly    Complete by:  As directed      Increase activity slowly    Complete by:  As directed      Weight bearing as tolerated    Complete by:  As directed            Discharge Medication List as of 11/12/2014  1:31 PM    START taking these medications   Details  acetaminophen (TYLENOL) 325 MG tablet Take 2 tablets (650 mg total) by mouth every 6 (six) hours as needed for mild pain (or Fever >/= 101)., Starting 11/12/2014, Until Discontinued, Normal    amiodarone (PACERONE) 200 MG tablet Take 1 tablet (200 mg total) by mouth daily., Starting 11/12/2014, Until Discontinued, Normal    calcium carbonate (TUMS - DOSED IN MG ELEMENTAL CALCIUM) 500 MG chewable tablet Chew 1 tablet (200 mg of elemental calcium total) by mouth once., Starting 11/12/2014, Normal    metoCLOPramide (REGLAN) 5 MG tablet Take 1 tablet (5 mg total) by mouth 3 (three) times daily before meals., Starting 11/12/2014, Until Discontinued, Print    metoprolol tartrate (LOPRESSOR) 25 MG tablet Take 1 tablet (25 mg total) by mouth every 8 (eight) hours., Starting 11/12/2014, Until Discontinued, Normal    sennosides-docusate sodium (SENOKOT-S) 8.6-50 MG tablet Take 2 tablets by mouth daily., Starting 10/20/2014, Until Discontinued, Normal      CONTINUE these medications which have CHANGED   Details  insulin glargine (LANTUS) 100 UNIT/ML injection Inject 0.2 mLs (20 Units total) into the skin at bedtime., Starting 11/12/2014, Until Discontinued, Normal    rivaroxaban (XARELTO) 20 MG TABS tablet Take 1 tablet (20 mg total) by mouth every morning., Starting 10/20/2014, Until Discontinued, Normal      CONTINUE these medications which have NOT CHANGED   Details  allopurinol (ZYLOPRIM) 300 MG tablet Take 300 mg by mouth every morning. , Until Discontinued, Historical Med    Multiple Vitamins-Minerals (CENTRUM SILVER ULTRA MENS PO) Take 1 tablet by mouth daily., Until Discontinued, Historical Med    omega-3 acid ethyl esters (LOVAZA) 1 G capsule Take 1 g by mouth every morning., Until Discontinued, Historical Med    pantoprazole (PROTONIX) 40 MG tablet Take 40 mg by mouth every evening. , Until  Discontinued,  Historical Med    rosuvastatin (CRESTOR) 10 MG tablet Take 10 mg by mouth every evening. , Until Discontinued, Historical Med      STOP taking these medications     carvedilol (COREG) 6.25 MG tablet      saxagliptin HCl (ONGLYZA) 5 MG TABS tablet      telmisartan-hydrochlorothiazide (MICARDIS HCT) 80-25 MG per tablet      oxyCODONE (OXY IR/ROXICODONE) 5 MG immediate release tablet        No Known Allergies Follow-up Information    Follow up with LANDAU,JOSHUA P, MD. Schedule an appointment as soon as possible for a visit in 2 weeks.   Specialty:  Orthopedic Surgery   Contact information:   [address] [place] [address] [phone]       Follow up with Horatio Pel, MD In 2 weeks.   Specialty:  Internal Medicine   Contact information:   [address] [place] [address] [phone]       Call Adrian Prows, MD.   Specialty:  Cardiology   Why:  To be seen 2 weeks post discharge.   Contact information:   [address] [place] [address] [phone]       Follow up with Horatio Pel, MD In 2 weeks.   Specialty:  Internal Medicine   Contact information:   [address] [place] [address] [phone]        The results of significant diagnostics from this hospitalization (including imaging, microbiology, ancillary and laboratory) are listed below for reference.    Significant Diagnostic Studies: No results found.  Microbiology: No results found for this or any previous visit (from the past 240 hour(s)).   Labs: Basic Metabolic Panel: No results for input(s): NA, K, CL, CO2, GLUCOSE, BUN, CREATININE, CALCIUM, MG, PHOS in the last 168 hours. Liver Function Tests: No results for input(s): AST, ALT, ALKPHOS, BILITOT, PROT, ALBUMIN in the last 168 hours. No results for input(s): LIPASE, AMYLASE in the last 168 hours. No results for input(s): AMMONIA in  the last 168 hours. CBC: No results for input(s): WBC, NEUTROABS, HGB, HCT, MCV, PLT in the last 168 hours. Cardiac Enzymes: No results for input(s): CKTOTAL, CKMB, CKMBINDEX, TROPONINI in the last 168 hours. BNP: BNP (last 3 results) No results for input(s): BNP in the last 8760 hours.  ProBNP (last 3 results) No results for input(s): PROBNP in the last 8760 hours.  CBG: No results for input(s): GLUCAP in the last 168 hours.     SignedKelvin Cellar  [facility] 06/05/2015, 5:26 PM

## 2014-11-12 NOTE — Clinical Social Work Placement (Signed)
   CLINICAL SOCIAL WORK PLACEMENT  NOTE  Date:  11/12/2014  Patient Details  Name: Howard Charles MRN: [id number] Date of Birth: Mar 17, 1936  Clinical Social Work is seeking post-discharge placement for this patient at the [place] level of care (*CSW will initial, date and re-position this form in  chart as items are completed):  Yes   Patient/family provided with [facility] Work Department's list of facilities offering this level of care within the geographic area requested by the patient (or if unable, by the patient's family).  Yes   Patient/family informed of their freedom to choose among providers that offer the needed level of care, that participate in Medicare, Medicaid or managed care program needed by the patient, have an available bed and are willing to accept the patient.  Yes   Patient/family informed of [facility]'s ownership interest in [facility] and [facility], as well as of the fact that they are under no obligation to receive care at these facilities.  PASRR submitted to EDS on 10/22/14     PASRR number received on 10/22/14     Existing PASRR number confirmed on       FL2 transmitted to all facilities in geographic area requested by pt/family on 10/22/14     FL2 transmitted to all facilities within larger geographic area on       Patient informed that his/her managed care company has contracts with or will negotiate with certain facilities, including the following:        Yes   Patient/family informed of bed offers received.  Patient chooses bed at [facility]     Physician recommends and patient chooses bed at      Patient to be transferred to [facility] on 11/12/14.  Patient to be transferred to facility by ptar     Patient family notified on 11/12/14 of transfer.  Name of family member notified:  Mrs. Tsang     PHYSICIAN Please sign FL2     Additional Comment:     _______________________________________________ Cranford Mon, LCSW 11/12/2014, 10:13 AM

## 2014-11-13 ENCOUNTER — Encounter: Payer: Self-pay | Admitting: Adult Health

## 2014-11-13 ENCOUNTER — Non-Acute Institutional Stay (SKILLED_NURSING_FACILITY): Payer: Medicare Other | Admitting: Adult Health

## 2014-11-13 DIAGNOSIS — G309 Alzheimer's disease, unspecified: Secondary | ICD-10-CM

## 2014-11-13 DIAGNOSIS — S72142S Displaced intertrochanteric fracture of left femur, sequela: Secondary | ICD-10-CM | POA: Diagnosis not present

## 2014-11-13 DIAGNOSIS — E669 Obesity, unspecified: Secondary | ICD-10-CM

## 2014-11-13 DIAGNOSIS — M109 Gout, unspecified: Secondary | ICD-10-CM

## 2014-11-13 DIAGNOSIS — E119 Type 2 diabetes mellitus without complications: Secondary | ICD-10-CM

## 2014-11-13 DIAGNOSIS — K567 Ileus, unspecified: Secondary | ICD-10-CM | POA: Diagnosis not present

## 2014-11-13 DIAGNOSIS — I10 Essential (primary) hypertension: Secondary | ICD-10-CM

## 2014-11-13 DIAGNOSIS — I4891 Unspecified atrial fibrillation: Secondary | ICD-10-CM

## 2014-11-13 DIAGNOSIS — R5381 Other malaise: Secondary | ICD-10-CM

## 2014-11-13 DIAGNOSIS — K219 Gastro-esophageal reflux disease without esophagitis: Secondary | ICD-10-CM

## 2014-11-13 DIAGNOSIS — E1169 Type 2 diabetes mellitus with other specified complication: Secondary | ICD-10-CM

## 2014-11-13 DIAGNOSIS — N183 Chronic kidney disease, stage 3 unspecified: Secondary | ICD-10-CM

## 2014-11-13 DIAGNOSIS — E785 Hyperlipidemia, unspecified: Secondary | ICD-10-CM

## 2014-11-13 DIAGNOSIS — F0280 Dementia in other diseases classified elsewhere without behavioral disturbance: Secondary | ICD-10-CM

## 2014-11-13 DIAGNOSIS — N189 Chronic kidney disease, unspecified: Secondary | ICD-10-CM

## 2014-11-13 NOTE — Progress Notes (Signed)
Patient ID: Howard Charles, male   DOB: 03/15/1936, 79 y.o.   MRN: [id number]   11/13/2014  Facility:  [facility]urs[facility] Home Location:  [place] Room Number: 1007-P LEVEL OF CARE:  SNF (31)    Chief Complaint  Patient presents with  . Hospitalization Follow-up    Physical deconditioning, left hip fracture S/P IM Nail, Ileus, atrial fibrillation, chronic kidney disease stage III, diabetes mellitus, hypertension, gout, GERD, hyperlipidemia and Alzheimer's disease    HISTORY OF PRESENT ILLNESS:  This is a 79 year old male who has been admitted to [facility] on 11/12/14 from [facility]. He has PMH of diabetes mellitus type 2, diabetic neuropathy, hypertension, GERD, left and right knee DJD, bilateral hearing loss, chronic renal insufficiency stage III, dysrhythmia and on supplemental oxygen therapy. He fell in a store and sustained a left hip fracture for which he had IM nail on his 10/18/58. He had A. fib with RVR post of requiring Cardizem drip. Cardiology was consulted and Cardizem was changed to amiodarone. He also developed ileus which required decompression 2.  He has been admitted for a short-term rehabilitation.  PAST MEDICAL HISTORY:  Past Medical History  Diagnosis Date  . Diabetes mellitus type 2 in obese   . Diabetic neuropathy   . Hypertension   . GERD (gastroesophageal reflux disease)   . Gout   . Left knee DJD   . Right knee DJD   . Bilateral hearing loss     WEARS HEARING AIDS........RIGHT IS BETTER THEN LEFT  . Chronic renal insufficiency, stage III (moderate)     Creatinine 1.47  . Dysrhythmia     h/o A Fib  . On supplemental oxygen therapy     at night  . Ileus, postoperative 10/22/2014  . PONV (postoperative nausea and vomiting)     H/O ....BUT LAST FEW TIMES---NONE    CURRENT MEDICATIONS: Reviewed per MAR/see medication list  No Known Allergies   REVIEW OF SYSTEMS:  GENERAL: no change in appetite, no fatigue, no  weight changes, no fever, chills or weakness RESPIRATORY: no cough, SOB, DOE, wheezing, hemoptysis CARDIAC: no chest pain, edema or palpitations GI: no abdominal pain, diarrhea, constipation, heart burn, nausea or vomiting  PHYSICAL EXAMINATION  GENERAL: no acute distress, obese Skin:  Bilateral cheeks has dry rashes;  Left hip surgical incision is dry, no erythema EYES: conjunctivae normal, sclerae normal, normal eye lids NECK: supple, trachea midline, no neck masses, no thyroid tenderness, no thyromegaly LYMPHATICS: no LAN in the neck, no supraclavicular LAN RESPIRATORY: breathing is even & unlabored, BS CTAB CARDIAC: RRR, no murmur,no extra heart sounds, no edema GI: abdomen soft, normal BS, no masses, no tenderness, no hepatomegaly, no splenomegaly EXTREMITIES:  able to move 4 extremities PSYCHIATRIC: the patient is alert & oriented to person, affect & behavior appropriate  LABS/RADIOLOGY: Labs reviewed: Basic Metabolic Panel:  Recent Labs  10/24/14 0448  11/02/14 0347  11/04/14 1425 11/05/14 0436 11/06/14 0500 11/07/14 0444 11/08/14 0209 11/10/14 0435  NA 137  < > 132*  < > 129* 132* 133* 135 133* 134*  K 3.6  < > 3.9  < > 2.8* 3.2* 3.1* 3.2* 4.1 3.6  CL 106  < > 99*  < > 93* 100* 101 103 103 103  CO2 23  < > 23  < > 20* 21* 21* 23 20* 22  GLUCOSE 136*  < > 142*  < > 376* 87 115* 101* 124* 110*  BUN 38*  < >  20  < > 14 11 12 9 9 9   CREATININE 1.64*  < > 1.80*  < > 1.32* 1.37* 1.30* 1.24 1.24 1.34*  CALCIUM 8.7*  < > 8.8*  < > 8.4* 8.8* 8.5* 8.5* 9.1 9.0  MG 1.9  < > 1.7  --  1.6* 1.6* 2.0  --   --   --   PHOS 2.2*  --  3.6  --   --   --  3.2  --   --   --   < > = values in this interval not displayed. Liver Function Tests:  Recent Labs  11/02/14 0347 11/06/14 0500  AST 29 31  ALT 53 48  ALKPHOS 108 129*  BILITOT 1.1 1.0  PROT 5.0* 5.0*  ALBUMIN 2.3* 2.2*    Recent Labs  11/05/14 0955  LIPASE 28  AMYLASE 39   CBC:  Recent Labs  10/19/14 1526   10/24/14 1902  11/06/14 0500 11/07/14 0444 11/08/14 0209  WBC 10.9*  < > 19.1*  < > 10.9* 10.3 10.9*  NEUTROABS 7.2  --  16.2*  --   --   --   --   HGB 15.2  < > 11.9*  < > 10.6* 10.3* 11.8*  HCT 44.4  < > 34.8*  < > 31.2* 30.2* 34.8*  MCV 93.3  < > 92.3  < > 91.2 91.5 91.1  PLT 196  < > 237  < > 375 379 376  < > = values in this interval not displayed.  CBG:  Recent Labs  11/09/14 0800 11/09/14 1108 11/11/14 2210  GLUCAP 137* 122* 126*    Ct Abdomen Pelvis Wo Contrast  10/24/2014   CLINICAL DATA:  Diffuse abdominal pain for 3 days, recent ileus, status post decompression colonoscopy earlier today  EXAM: CT ABDOMEN AND PELVIS WITHOUT CONTRAST  TECHNIQUE: Multidetector CT imaging of the abdomen and pelvis was performed following the standard protocol without IV contrast.  COMPARISON:  10/24/2014 plain radiographs, 02/01/2006 CT abdomen with contrast  FINDINGS: Lower chest: Right base partial collapse/consolidation evident. Minor left base atelectasis. Mild cardiomegaly. Small hiatal hernia evident. No pericardial or pleural effusion.  Abdomen: Limited exam without IV or oral contrast. NG tube within the distal stomach body. Stomach, small bowel and colon are decompressed following the decompression colonoscopy and NG tube insertion. No current significant dilatation, obstruction or ileus. Colon has some areas of scattered retained fluid with small air-fluid levels. No free air.  Prior cholecystectomy noted. Liver, biliary system, fatty replaced pancreas, spleen, and adrenal glands are within normal limits for age and noncontrast imaging. Kidneys demonstrate no renal obstruction or hydronephrosis. Left lower pole renal cyst suspected measuring 3.3 cm, image 53.  No abdominal hemorrhage, fluid collection, abscess, or adenopathy.  Aortic atherosclerosis noted without aneurysm or retroperitoneal hemorrhage.  Small amount of right lower quadrant stranding mesenteric edema and free fluid, images 71  through 87 without clear etiology. Appendix is not visualized.  Small fat containing umbilical hernia.  Pelvis: Postop changes in the pelvis prior prostatectomy and lymph node dissection. Urinary bladder unremarkable. No pelvic fluid collection, hemorrhage, abscess, or adenopathy. No inguinal abnormality or significant hernia. Left hip fixation hardware creates artifact. Postop changes of the left hip region.  Left hip fracture again demonstrated, status post operative fixation. Degenerative changes of the spine. No other acute osseous finding.  IMPRESSION: Decompressed stomach, small bowel and colon following NG tube insertion and decompression colonoscopy.  Colon appears elongated and redundant with areas  containing residual fluid with air-fluid levels but no significant recurrent ileus at this time.  Negative for abscess or free air  Nonspecific mesenteric stranding edema and a small amount of free fluid in the right lower quadrant without clear etiology possibly related to the recent colonic ileus.  Appendix not visualized.  Postop changes in the pelvis and left hip region   Electronically Signed   By: Jerilynn Mages.  Shick M.D.   On: 10/24/2014 20:59   Dg Chest 1 View  10/19/2014   CLINICAL DATA:  Intertrochanteric left femoral neck fracture after a fall today while at the grocery store. Preoperative respiratory evaluation.  EXAM: CHEST  1 VIEW  COMPARISON:  06/20/2014 and earlier.  FINDINGS: Suboptimal inspiration accounts for crowded bronchovascular markings, especially in the bases, and accentuates the cardiac silhouette. Taking this into account, cardiac silhouette normal in size for the AP supine technique. Stable chronic mild elevation of the right hemidiaphragm. Lungs clear. Bronchovascular markings normal. Pulmonary vascularity normal. No visible pleural effusions. No pneumothorax.  IMPRESSION: Suboptimal inspiration.  No acute cardiopulmonary disease.   Electronically Signed   By: Evangeline Dakin M.D.   On:  10/19/2014 15:52   Dg Cervical Spine 2 Or 3 Views  11/06/2014   CLINICAL DATA:  Neck pain radiating to the top of the shoulders for several days. No known injury.  EXAM: CERVICAL SPINE - 2-3 VIEW  COMPARISON:  None.  FINDINGS: C1 to the superior endplate of C7 is imaged on the provided lateral radiograph. The cervical thoracic junction is obscured, even on the provided swimmer's radiograph secondary to overlying osseous soft tissue structures.  The head is held in a minimal amount of left lateral flexion, presumably positional. No definite anterolisthesis or retrolisthesis.  Cervical vertebral body heights are preserved. Prevertebral soft tissues are normal.  Mild-to-moderate multilevel cervical spine DDD, worse at C6-C7 with disc space height loss, endplate irregularity and sclerosis.  There is partial ossification of the nuchal ligament posterior to the C4 and C5 spinous process ease as well as about the tip of the C7 spinous process.  Regional soft tissues appear otherwise normal. A nasoenteric tube tip terminates below the imaged field of view.  IMPRESSION: 1. No acute findings. 2. Mild-to-moderate multilevel cervical spine DDD, worse at C6-C7.   Electronically Signed   By: Sandi Mariscal M.D.   On: 11/06/2014 19:33   Dg Abd 1 View  11/01/2014   CLINICAL DATA:  Post decompression of the colon.  EXAM: ABDOMEN - 1 VIEW  COMPARISON:  11/01/2014  FINDINGS: Decreasing gaseous distention of the colon since prior study. Continued small bowel dilatation. Prior cholecystectomy. No free air organomegaly.  IMPRESSION: Decreasing gaseous distention of the colon with continued moderate gaseous distension of small bowel.   Electronically Signed   By: Rolm Baptise M.D.   On: 11/01/2014 12:38   Dg Abd 1 View  10/29/2014   CLINICAL DATA:  Abdominal pain.  Ileus.  EXAM: ABDOMEN - 1 VIEW  COMPARISON:  10/28/2014  FINDINGS: Last diffuse gaseous distention of the colon is again demonstrated. Cecum measures approximately 10 cm in  diameter which is slightly decreased since previous study. No definite small bowel dilatation seen. Pelvic surgical clips again seen from prior prostatectomy and iliac lymph node dissection.  IMPRESSION: Diffuse colonic distention shows mild improvement since previous study.   Electronically Signed   By: Earle Gell M.D.   On: 10/29/2014 08:32   Dg Abd 1 View  10/28/2014   CLINICAL DATA:  Abdominal  distention, followup ileus  EXAM: ABDOMEN - 1 VIEW  COMPARISON:  10/27/2014  FINDINGS: Scattered large and small bowel gas is noted. Distension of the colon is again seen. The mid transverse colon now measures 9.6 cm which is a slight increase when compared with the prior exam. The cecum measures approximately 11.7 cm which is also increased from the prior exam. No free air is seen. No other focal abnormality is noted.  IMPRESSION: Persistent and slightly worsened colonic ileus.   Electronically Signed   By: Inez Catalina M.D.   On: 10/28/2014 08:30   Dg Abd 1 View  10/27/2014   CLINICAL DATA:  Adynamic ileus.  Recent hip/ femur surgery.  EXAM: ABDOMEN - 1 VIEW  COMPARISON:  10/26/2014 and 10/25/2014 as well as 10/24/2014, CT 10/24/2014  FINDINGS: Exam demonstrates continued air-filled colonic dilatation proximal to the splenic flexure as the transverse colon measures 8.5 cm in diameter slightly worse compared to the recent prior exam. This degree of dilatation is slightly improved compared to 10/24/2014. No free peritoneal air. Surgical clips are present over the right upper quadrant and throughout the pelvis. There are a few air-filled small bowel loops in the right abdomen. Remainder of the exam is unchanged.  IMPRESSION: Slight worsening air-filled dilatation of the colon proximal to the splenic flexure. Findings may be due to colonic ileus versus intermittent distal colonic obstruction.   Electronically Signed   By: Marin Olp M.D.   On: 10/27/2014 11:00   Dg Abd 1 View  10/26/2014   CLINICAL DATA:  Ileus   EXAM: ABDOMEN - 1 VIEW  COMPARISON:  10/25/2014  FINDINGS: Increased extent of gaseous prominence of colon, now 7.2 cm maximal diameter but relatively featureless. No gas-filled dilated small bowel loop. Pelvic clips are noted. Left femoral nail in place. No acute osseous finding. Nasogastric tube tip terminates over the distal stomach, which is decompressed.  IMPRESSION: Relatively featureless borderline dilated gas-filled colon, with slight interval increase in diameter. Although this may be seen with ileus, this raises the question of underlying long-standing inflammatory bowel disease.   Electronically Signed   By: Conchita Paris M.D.   On: 10/26/2014 11:33   Dg Abd 1 View  10/25/2014   CLINICAL DATA:  Ileus, lower abdominal pain.  EXAM: ABDOMEN - 1 VIEW  COMPARISON:  10/24/2014  FINDINGS: NG tube remains in stable position in the stomach. Significant decreasing gaseous distention of the colon. Mild gaseous distention of the cecum and proximal transverse colon. No small bowel dilatation. No free air organomegaly. Prior cholecystectomy. Surgical clips along the pelvic sidewalls, presumably prior lymph node dissection.  IMPRESSION: Significant improvement in gaseous distention of the colon.   Electronically Signed   By: Rolm Baptise M.D.   On: 10/25/2014 10:41   Dg Abd 1 View  10/24/2014   CLINICAL DATA:  Distention  EXAM: ABDOMEN - 1 VIEW  COMPARISON:  10/22/2014  FINDINGS: Gaseous distention of the ascending and transverse colon.  Left colon is relatively decompressed.  No evidence of small bowel obstruction.  Enteric tube in the distal stomach.  Cholecystectomy clips.  Surgical clips in the pelvis.  Status post ORIF of the left hip.  IMPRESSION: Gaseous distention of the right colon, suggesting adynamic colonic ileus.  No evidence of small bowel obstruction   Electronically Signed   By: Julian Hy M.D.   On: 10/24/2014 11:28   Ct Head Wo Contrast  10/19/2014   CLINICAL DATA:  Witnessed fall  today.  EXAM: CT  HEAD WITHOUT CONTRAST  TECHNIQUE: Contiguous axial images were obtained from the base of the skull through the vertex without intravenous contrast.  COMPARISON:  06/29/2012  FINDINGS: No acute intracranial abnormality. Specifically, no hemorrhage, hydrocephalus, mass lesion, acute infarction, or significant intracranial injury. No acute calvarial abnormality. Mastoid air cells are clear as are the visualized paranasal sinuses.  IMPRESSION: No intracranial abnormality.   Electronically Signed   By: Rolm Baptise M.D.   On: 10/19/2014 16:02   Ct Abdomen Pelvis W Contrast  11/04/2014   CLINICAL DATA:  79 year old male with history of small bowel obstruction or ileus following left hip surgery. Abdominal distention.  EXAM: CT ABDOMEN AND PELVIS WITH CONTRAST  TECHNIQUE: Multidetector CT imaging of the abdomen and pelvis was performed using the standard protocol following bolus administration of intravenous contrast.  CONTRAST:  11mL OMNIPAQUE IOHEXOL 300 MG/ML  SOLN  COMPARISON:  CT the abdomen and pelvis 10/24/2014.  FINDINGS: Lower chest: Small hiatal hernia. Nasogastric tube. Scattered linear opacities in the lungs bilaterally (right greater than left), compatible with areas of subsegmental atelectasis and/or scarring.  Hepatobiliary: No cystic or solid hepatic lesions. No intra or extrahepatic biliary ductal dilatation. Status post cholecystectomy.  Pancreas: No pancreatic mass. No pancreatic ductal dilatation. No pancreatic or peripancreatic fluid or inflammatory changes.  Spleen: Unremarkable.  Adrenals/Urinary Tract: 2.9 cm well-defined low-attenuation nonenhancing lesion in the lower pole of the left kidney is compatible with a simple cyst. Right kidney is normal in appearance. Bilateral adrenal glands are normal in appearance. No hydroureteronephrosis. Urinary bladder is normal in appearance.  Stomach/Bowel: Normal appearance of the stomach. Nasogastric tube extends into the third portion of  the duodenum. The mid small bowel remains mildly dilated measuring up to 4.5 cm in diameter. There are multiple air-fluid levels noted. However, there is no discrete transition point identified, and the small bowel gradually transitions to a normal caliber, and ultimately completely decompressed distal small bowel. The colon is remarkable for several areas of colonic wall thickening, most notable in the proximal descending colon, distal transverse colon and ascending colon extending to the level of the hepatic flexure, suggestive of a colitis. Normal appendix.  Vascular/Lymphatic: Atherosclerosis throughout the abdominal and pelvic vasculature, without evidence of aneurysm or dissection. No lymphadenopathy noted in the abdomen or pelvis.  Reproductive: Status post prostatectomy. Seminal vesicles are unremarkable in appearance.  Other: No significant volume of ascites.  No pneumoperitoneum.  Musculoskeletal: Status post ORIF in the left femoral neck traversing a femoral neck fracture. There are no aggressive appearing lytic or blastic lesions noted in the visualized portions of the skeleton.  IMPRESSION: 1. Although there is recurrent dilatation of the mid small bowel with multiple air-fluid levels, the findings are favored to reflect a regional small bowel ileus, as there is no transition point to suggest frank obstruction, and no dilatation of the more proximal small bowel (although there is a nasogastric tube in place). 2. Multifocal thickening of the colonic wall, suggestive of colitis. 3. Normal appendix. 4. Small hiatal hernia. 5. Atherosclerosis. 6. Additional incidental findings, as above.   Electronically Signed   By: Vinnie Langton M.D.   On: 11/04/2014 18:02   Dg Abd 2 Views  11/05/2014   CLINICAL DATA:  Abdominal distention, postop left hip surgery  EXAM: ABDOMEN - 2 VIEW  COMPARISON:  CT abdomen pelvis of 11/04/2014  FINDINGS: Supine and erect views the abdomen show both large and small bowel gas  without significant distension most consistent with postoperative ileus. No  free air is seen on the left lateral decubitus of the abdomen. An NG tube tip overlies the region of the expected descending duodenal loop. Pinning of left hip fracture is present.  IMPRESSION: Probable mild ileus.  No definite bowel obstruction or free air.   Electronically Signed   By: Ivar Drape M.D.   On: 11/05/2014 14:07   Dg Abd 2 Views  11/04/2014   CLINICAL DATA:  Abdominal distention.  EXAM: ABDOMEN - 2 VIEW  COMPARISON:  11/03/2014, 11/01/2014.  FINDINGS: NG tube tip below hemidiaphragms. Persistent prominent small bowel distention with a paucity of intra colonic gas. These findings are consistent with small bowel obstruction. No free air identified. Surgical clips in the pelvis. Postsurgical changes left hip. Degenerative changes lumbar spine right hip.  IMPRESSION: Persistent severely distended small bowel with paucity of intra colonic bowel gas. These findings are consistent with small bowel obstruction. NG tube tip noted below the hemidiaphragms.   Electronically Signed   By: Marcello Moores  Register   On: 11/04/2014 08:29   Dg Abd 2 Views  11/03/2014   CLINICAL DATA:  Abdominal distention, bowel obstruction, nasogastric tube, post LEFT hip surgery, history diabetes, hypertension, GERD  EXAM: ABDOMEN - 2 VIEW  COMPARISON:  11/01/2014  FINDINGS: Unknown position of tip of nasogastric tube, question coiled within a distended stomach versus beyond ligament of Treitz in proximal jejunum.  Persistent significant gaseous distention of small bowel loops.  Paucity of colonic gas.  Significant bowel wall thickening of the cecum and ascending colon high suspicious for colitis.  No free intraperitoneal air.  Mild distention of the urinary bladder.  Catheter projects over inferior bladder.  BILATERAL pelvic surgical clips.  Bones demineralized with orthopedic harbor noted at the proximal LEFT femur.  IMPRESSION: Significant bowel wall  thickening of cecum and ascending colon highly suspicious for colitis.  Persistent gaseous distention of small bowel loops which could be related to obstruction or ileus.   Electronically Signed   By: Lavonia Dana M.D.   On: 11/03/2014 10:47   Dg Abd 2 Views  11/01/2014   CLINICAL DATA:  Abdominal distention, vomiting, status post hip placement last week.  EXAM: ABDOMEN - 2 VIEW  COMPARISON:  10/31/2014  FINDINGS: There is diffuse gaseous distention of large and small bowel. Cecum is approximately 13-14 cm in diameter. This is larger than prior study. The cecum was not well visualized or measurable previously. Proximal transverse colon measures 11-12 cm in diameter, compared with 10 cm previously.  No free air. No organomegaly. Prior cholecystectomy. Surgical clips in the pelvis. No acute bony abnormality.  IMPRESSION: Diffuse marked gaseous distention of large and small bowel, likely worsening since prior study. No free air.   Electronically Signed   By: Rolm Baptise M.D.   On: 11/01/2014 10:49   Dg Abd Portable 1v  10/31/2014   CLINICAL DATA:  Follow-up ileus  EXAM: PORTABLE ABDOMEN - 1 VIEW  COMPARISON:  None.  FINDINGS: Persistent gaseous colonic distension. Some gaseous distended small bowel loops.  IMPRESSION: Persistent colonic and small bowel gaseous distension. Findings suspicious for significant ileus   Electronically Signed   By: Lahoma Crocker M.D.   On: 10/31/2014 13:19   Dg Abd Portable 1v  10/30/2014   CLINICAL DATA:  Ileus  EXAM: PORTABLE ABDOMEN - 1 VIEW  COMPARISON:  10/29/2014  FINDINGS: Diffuse gaseous distension of the: Again noted. Transverse colon measures approximately 9 cm in diameter. C gum measures approximately 10 cm in diameter. Findings are  similar to prior study.  Prior cholecystectomy. Surgical clips in the pelvis. No visible free air.  IMPRESSION: Continued diffuse colonic distention, unchanged.   Electronically Signed   By: Rolm Baptise M.D.   On: 10/30/2014 10:59   Dg Abd  Portable 1v  10/22/2014   CLINICAL DATA:  Ileus, followup  EXAM: PORTABLE ABDOMEN - 1 VIEW  COMPARISON:  Abdomen film of 10/21/2014  FINDINGS: There is little change in gaseous distention of both large and small bowel. This pattern is most consistent with ileus. The cecum currently measures 12 cm in diameter, and continued followup is recommended. Multiple surgical clips are noted within the pelvis and prior left hip pinning is present.  IMPRESSION: Persistent ileus pattern.  Recommend continued follow-up.   Electronically Signed   By: Ivar Drape M.D.   On: 10/22/2014 08:09   Dg Abd Portable 1v  10/21/2014   CLINICAL DATA:  Nausea vomiting following hip surgery  EXAM: PORTABLE ABDOMEN - 1 VIEW  COMPARISON:  None.  FINDINGS: Colon is distended with gas although not significantly dilated. Correlation with physical exam is recommended. This may represent a postoperative colonic ileus. Postsurgical changes are noted in the proximal left hip consistent with the patient's given clinical history. Findings of prostatectomy and cholecystectomy are noted as well.  IMPRESSION: Gaseous distension of the colon without true obstructive change. This may represent a postoperative ileus.   Electronically Signed   By: Inez Catalina M.D.   On: 10/21/2014 09:01   Dg C-arm 1-60 Min  10/20/2014   CLINICAL DATA:  Left intertrochanteric fracture.  EXAM: DG C-ARM 61-120 MIN; LEFT FEMUR 2 VIEWS  TECHNIQUE: C-arm images of the left femur were obtained in the operating room.  FLUOROSCOPY TIME:  Radiation Exposure Index (as provided by the fluoroscopic device): Not applicable  If the device does not provide the exposure index:  Fluoroscopy Time (in minutes and seconds):  0 min 49 seconds  Number of Acquired Images:  For  COMPARISON:  Left hip radiographs dated 10/19/2014.  FINDINGS: Intra medullary rod fixation of the previously demonstrated left intertrochanteric fracture with anatomic position and alignment of the fracture fragments.  Left knee prosthesis. No new fractures or dislocations. Lower pelvic surgical clips.  IMPRESSION: Hardware fixation of the previously demonstrated left intertrochanteric fracture with anatomic position and alignment.   Electronically Signed   By: Claudie Revering M.D.   On: 10/20/2014 10:31   Dg Hip Port Unilat With Pelvis 1v Left  10/20/2014   CLINICAL DATA:  Immediately postop ORIF of an intertrochanteric left femoral neck fracture.  EXAM: LEFT HIP (WITH PELVIS) 1 VIEW PORTABLE  COMPARISON:  Left hip x-rays with AP pelvis yesterday.  FINDINGS: Examination was performed portably. ORIF of the intertrochanteric left femoral neck fracture with placement of an intramedullary nail and compression screw. Anatomic alignment. No acute complicating features. Prior left total knee arthroplasty.  IMPRESSION: Anatomic alignment post ORIF of the intertrochanteric left femoral neck fracture without acute complicating features.   Electronically Signed   By: Evangeline Dakin M.D.   On: 10/20/2014 10:33   Dg Hip Unilat With Pelvis 2-3 Views Left  10/19/2014   CLINICAL DATA:  79 year old who fell while at the gross read stool or earlier today and injured the left hip pain. Patient has chronic left hip pain which acutely worsened after the fall.  EXAM: LEFT HIP (WITH PELVIS) 2-3 VIEWS  COMPARISON:  None.  FINDINGS: Intertrochanteric left femoral neck fracture with the fracture line extending into the  proximal femoral metaphysis. Hip joint anatomically aligned with severe joint space narrowing. Bone mineral density relatively well preserved for age.  Included AP pelvis demonstrates mild joint space narrowing in the contralateral right hip. Sacroiliac joints and symphysis pubis intact. No fractures elsewhere. Degenerative changes involving the lower lumbar spine.  IMPRESSION: 1. Acute traumatic intertrochanteric left femoral neck fracture with the fracture line extending into the proximal femoral metaphysis. 2. Severe osteoarthritis  involving the left hip.   Electronically Signed   By: Evangeline Dakin M.D.   On: 10/19/2014 15:51   Dg Femur Min 2 Views Left  11/06/2014   CLINICAL DATA:  ORIF femur fracture.  EXAM: LEFT FEMUR 2 VIEWS  COMPARISON:  10/19/2014  FINDINGS: Intra medullary rod and dynamic screw has been placed, transfixing an intertrochanteric left femur fracture. Anatomic alignment of the fracture fragments. No breakage or loosening of the hardware. Left total knee arthroplasty is also present and anatomically aligned. Osteopenia.  IMPRESSION: ORIF left intertrochanteric femur fracture anatomically aligned.   Electronically Signed   By: Marybelle Killings M.D.   On: 11/06/2014 17:37   Dg Femur Min 2 Views Left  10/20/2014   CLINICAL DATA:  Left intertrochanteric fracture.  EXAM: DG C-ARM 61-120 MIN; LEFT FEMUR 2 VIEWS  TECHNIQUE: C-arm images of the left femur were obtained in the operating room.  FLUOROSCOPY TIME:  Radiation Exposure Index (as provided by the fluoroscopic device): Not applicable  If the device does not provide the exposure index:  Fluoroscopy Time (in minutes and seconds):  0 min 49 seconds  Number of Acquired Images:  For  COMPARISON:  Left hip radiographs dated 10/19/2014.  FINDINGS: Intra medullary rod fixation of the previously demonstrated left intertrochanteric fracture with anatomic position and alignment of the fracture fragments. Left knee prosthesis. No new fractures or dislocations. Lower pelvic surgical clips.  IMPRESSION: Hardware fixation of the previously demonstrated left intertrochanteric fracture with anatomic position and alignment.   Electronically Signed   By: Claudie Revering M.D.   On: 10/20/2014 10:31    ASSESSMENT/PLAN:  Physical deconditioning - for rehabilitation Left hip fracture S/P IM Nail - follow-up with Dr. [name], orthopedic surgeon, in 2 weeks; continue Tylenol 650 mg PO Q 4 hours PRN Ileus S/P decompression 2 -  Will minimize narcotic analgesia; pain is  well-controlled Atrial fibrillation with RVR - rate controlled; continue amiodarone 200 mg by mouth daily, Xarelto 20 mg 1 tab by mouth every morning and metoprolol 25 mg by mouth 3 times a day; follow-up with Dr. Einar Gip, cardiology, in 2 weeks Chronic kidney disease, stage III  - creatinine 1.34 ; will monitor Diabetes mellitus, type II in obese  - hemoglobin A1c 7.7; continue Lantus 20 units subcutaneous daily at bedtime  Hypertension - well controlled; continue metoprolol 25 mg by mouth 3 times a day  Gout - continue allopurinol 300 mg by mouth every morning GERD - continue Protonix 40 mg by mouth every evening Hyperlipidemia - continue Crestor 10 mg 1 tab by mouth every evening Alzheimer's disease - stable   Goals of care:  Short-term rehabilitation   Labs/test ordered:  BMP and CBC   Spent 50 minutes in patient care.   [facility], NP [employer] [phone]

## 2014-11-14 LAB — CBC AND DIFFERENTIAL
HEMATOCRIT: 34 % — AB (ref 41–53)
HEMOGLOBIN: 11.1 g/dL — AB (ref 13.5–17.5)
WBC: 10.2 10^3/mL

## 2014-11-14 LAB — BASIC METABOLIC PANEL
BUN: 15 mg/dL (ref 4–21)
Creatinine: 1.4 mg/dL — AB (ref 0.6–1.3)
Glucose: 86 mg/dL
Potassium: 3.4 mmol/L (ref 3.4–5.3)
Sodium: 138 mmol/L (ref 137–147)

## 2014-11-15 ENCOUNTER — Encounter: Payer: Self-pay | Admitting: Internal Medicine

## 2014-11-15 ENCOUNTER — Non-Acute Institutional Stay (SKILLED_NURSING_FACILITY): Payer: Medicare Other | Admitting: Internal Medicine

## 2014-11-15 DIAGNOSIS — K219 Gastro-esophageal reflux disease without esophagitis: Secondary | ICD-10-CM

## 2014-11-15 DIAGNOSIS — G4700 Insomnia, unspecified: Secondary | ICD-10-CM | POA: Diagnosis not present

## 2014-11-15 DIAGNOSIS — S72142S Displaced intertrochanteric fracture of left femur, sequela: Secondary | ICD-10-CM

## 2014-11-15 DIAGNOSIS — R5381 Other malaise: Secondary | ICD-10-CM

## 2014-11-15 DIAGNOSIS — N189 Chronic kidney disease, unspecified: Secondary | ICD-10-CM

## 2014-11-15 DIAGNOSIS — E46 Unspecified protein-calorie malnutrition: Secondary | ICD-10-CM

## 2014-11-15 DIAGNOSIS — E1122 Type 2 diabetes mellitus with diabetic chronic kidney disease: Secondary | ICD-10-CM

## 2014-11-15 DIAGNOSIS — E669 Obesity, unspecified: Secondary | ICD-10-CM | POA: Diagnosis not present

## 2014-11-15 DIAGNOSIS — E1169 Type 2 diabetes mellitus with other specified complication: Secondary | ICD-10-CM

## 2014-11-15 DIAGNOSIS — K9189 Other postprocedural complications and disorders of digestive system: Secondary | ICD-10-CM

## 2014-11-15 DIAGNOSIS — K913 Postprocedural intestinal obstruction: Secondary | ICD-10-CM

## 2014-11-15 DIAGNOSIS — E119 Type 2 diabetes mellitus without complications: Secondary | ICD-10-CM

## 2014-11-15 DIAGNOSIS — K567 Ileus, unspecified: Secondary | ICD-10-CM

## 2014-11-15 DIAGNOSIS — N183 Chronic kidney disease, stage 3 (moderate): Secondary | ICD-10-CM

## 2014-11-15 DIAGNOSIS — I4891 Unspecified atrial fibrillation: Secondary | ICD-10-CM | POA: Diagnosis not present

## 2014-11-15 NOTE — Progress Notes (Signed)
Patient ID: Howard Charles, male   DOB: 1936/01/02, 79 y.o.   MRN: [id number]     [place]  PCP: Horatio Pel, MD  Code Status: Full Code   No Known Allergies  Chief Complaint  Patient presents with  . New Admit To SNF    New Admission      HPI:  79 year old patient is here for short term rehabilitation post hospital admission from 10/19/14-11/12/14 with closed left hip fracture. He underwent intramedullary nailing. Post op he had afib with RVR and required cardizem drip. Cardiology was consulted, he was started on amiodarone. He also had ileus and required decompression twice with ng tube and rectal tube. He has PMH of diabetes mellitus type 2, diabetic neuropathy, hypertension, GERD, knee OA, CKD stage III among others. He is seen in his room today. His pain is under control with current regimen. Denies muscle spasm   Review of Systems:  Constitutional: Negative for fever, chills, diaphoresis.  HENT: Negative for headache, congestion Eyes: Negative for eye pain, blurred vision, double vision and discharge.  Respiratory: Negative for cough, shortness of breath and wheezing.   Cardiovascular: Negative for chest pain, palpitations.  Gastrointestinal: Negative for heartburn, nausea, vomiting, abdominal pain. Had bowel movement this am. Appetite is fair Genitourinary: Negative for dysuria Musculoskeletal: Negative for back pain, falls in facility Skin: Negative for itching, rash.  Neurological: Negative for focal weakness. Has occasional dizziness with change of position Psychiatric/Behavioral: Negative for depression. Has trouble sleeping  Past Medical History  Diagnosis Date  . Diabetes mellitus type 2 in obese   . Diabetic neuropathy   . Hypertension   . GERD (gastroesophageal reflux disease)   . Gout   . Left knee DJD   . Right knee DJD   . Bilateral hearing loss     WEARS HEARING AIDS........RIGHT IS BETTER THEN LEFT  . Chronic renal  insufficiency, stage III (moderate)     Creatinine 1.47  . Dysrhythmia     h/o A Fib  . On supplemental oxygen therapy     at night  . Ileus, postoperative 10/22/2014  . PONV (postoperative nausea and vomiting)     H/O ....BUT LAST FEW TIMES---NONE   Past Surgical History  Procedure Laterality Date  . Knee arthroscopy  2008    left   . Knee arthroscopy  2012    left  . Knee arthroscopy  2002    right  . Hemorrhoid surgery    . Inner ear surgery    . Shoulder arthroscopy with rotator cuff repair  2007  . Suprapubic prostatectomy      21 YRS  AGO  . Cholecystectomy    . Tonsillectomy    . Total knee arthroplasty  06/26/2012    Procedure: TOTAL KNEE ARTHROPLASTY;  Surgeon: Lorn Junes, MD;  Location: [place];  Service: Orthopedics;  Laterality: Left;  . Colonoscopy    . Total knee arthroplasty Right 05/21/2013    Procedure: TOTAL KNEE ARTHROPLASTY;  Surgeon: Lorn Junes, MD;  Location: [place];  Service: Orthopedics;  Laterality: Right;  . Intramedullary (im) nail intertrochanteric Left 10/20/2014  . Intramedullary (im) nail intertrochanteric Left 10/20/2014    Procedure: INTRAMEDULLARY (IM) NAIL INTERTROCHANTRIC;  Surgeon: [name], MD;  Location: [place];  Service: Orthopedics;  Laterality: Left;  Marland Kitchen Bowel decompression N/A 10/24/2014    Procedure: BOWEL DECOMPRESSION;  Surgeon: Clarene Essex, MD;  Location: [place];  Service: Endoscopy;  Laterality: N/A;  .  Colonoscopy N/A 10/24/2014    Procedure: COLONOSCOPY;  Surgeon: Clarene Essex, MD;  Location: [facility] ENDOSCOPY;  Service: Endoscopy;  Laterality: N/A;  . Colonoscopy N/A 11/01/2014    Procedure: COLONOSCOPY;  Surgeon: Ronald Lobo, MD;  Location: [facility] ENDOSCOPY;  Service: Endoscopy;  Laterality: N/A;   Social History:   reports that he has never smoked. He has never used smokeless tobacco. He reports that he does not drink alcohol or use illicit drugs.  Family History  Problem Relation Age of Onset  . Hypertension Mother   .  Diabetes Mother   . Heart disease Mother   . Heart disease Father   . Heart attack Father   . Hypertension Father   . Diabetes Mellitus II Father     Medications: Patient's Medications  New Prescriptions   No medications on file  Previous Medications   ACETAMINOPHEN (TYLENOL) 325 MG TABLET    Take 2 tablets (650 mg total) by mouth every 6 (six) hours as needed for mild pain (or Fever >/= 101).   ALLOPURINOL (ZYLOPRIM) 300 MG TABLET    Take 300 mg by mouth every morning.    AMIODARONE (PACERONE) 200 MG TABLET    Take 1 tablet (200 mg total) by mouth daily.   CALCIUM CARBONATE (TUMS - DOSED IN MG ELEMENTAL CALCIUM) 500 MG CHEWABLE TABLET    Chew 1 tablet (200 mg of elemental calcium total) by mouth once.   INSULIN GLARGINE (LANTUS) 100 UNIT/ML INJECTION    Inject 0.2 mLs (20 Units total) into the skin at bedtime.   METOCLOPRAMIDE (REGLAN) 5 MG TABLET    Take 1 tablet (5 mg total) by mouth 3 (three) times daily before meals.   METOPROLOL TARTRATE (LOPRESSOR) 25 MG TABLET    Take 1 tablet (25 mg total) by mouth every 8 (eight) hours.   MULTIPLE VITAMINS-MINERALS (CENTRUM SILVER ULTRA MENS PO)    Take 1 tablet by mouth daily.   OMEGA-3 ACID ETHYL ESTERS (LOVAZA) 1 G CAPSULE    Take 1 g by mouth every morning.   OXYGEN    Inhale 2 L into the lungs.   PANTOPRAZOLE (PROTONIX) 40 MG TABLET    Take 40 mg by mouth every evening.    PROTEIN (PROCEL) POWD    Take 1 scoop by mouth 2 (two) times daily. For Nutritional Support   RIVAROXABAN (XARELTO) 20 MG TABS TABLET    Take 1 tablet (20 mg total) by mouth every morning.   ROSUVASTATIN (CRESTOR) 10 MG TABLET    Take 10 mg by mouth every evening.    SENNOSIDES-DOCUSATE SODIUM (SENOKOT-S) 8.6-50 MG TABLET    Take 2 tablets by mouth daily.  Modified Medications   No medications on file  Discontinued Medications   No medications on file     Physical Exam: Filed Vitals:   11/15/14 0955  BP: 110/71  Pulse: 84  Temp: 97.4 F (36.3 C)  TempSrc:  Oral  Resp: 20  Height: 5\' 10"  (1.778 m)  Weight: 205 lb 6.4 oz (93.169 kg)  SpO2: 95%    General- elderly male, well built, in no acute distress Head- normocephalic, atraumatic Throat- moist mucus membrane Neck- no cervical lymphadenopathy Cardiovascular- irregular heart rate, palpable dorsalis pedis, trace ankle edema Respiratory- bilateral clear to auscultation, no wheeze, no rhonchi, no crackles, no use of accessory muscles Abdomen- bowel sounds present, soft, non tender Musculoskeletal- able to move all 4 extremities, left leg rom limited at hip Neurological- no focal deficit Skin- warm and dry,  left hip incision healing well Psychiatry- alert and oriented with normal mood and affect    Labs reviewed: Basic Metabolic Panel:  Recent Labs  10/24/14 0448  11/02/14 0347  11/04/14 1425 11/05/14 0436 11/06/14 0500 11/07/14 0444 11/08/14 0209 11/10/14 0435  NA 137  < > 132*  < > 129* 132* 133* 135 133* 134*  K 3.6  < > 3.9  < > 2.8* 3.2* 3.1* 3.2* 4.1 3.6  CL 106  < > 99*  < > 93* 100* 101 103 103 103  CO2 23  < > 23  < > 20* 21* 21* 23 20* 22  GLUCOSE 136*  < > 142*  < > 376* 87 115* 101* 124* 110*  BUN 38*  < > 20  < > 14 11 12 9 9 9   CREATININE 1.64*  < > 1.80*  < > 1.32* 1.37* 1.30* 1.24 1.24 1.34*  CALCIUM 8.7*  < > 8.8*  < > 8.4* 8.8* 8.5* 8.5* 9.1 9.0  MG 1.9  < > 1.7  --  1.6* 1.6* 2.0  --   --   --   PHOS 2.2*  --  3.6  --   --   --  3.2  --   --   --   < > = values in this interval not displayed. Liver Function Tests:  Recent Labs  11/02/14 0347 11/06/14 0500  AST 29 31  ALT 53 48  ALKPHOS 108 129*  BILITOT 1.1 1.0  PROT 5.0* 5.0*  ALBUMIN 2.3* 2.2*    Recent Labs  11/05/14 0955  LIPASE 28  AMYLASE 39   No results for input(s): AMMONIA in the last 8760 hours. CBC:  Recent Labs  10/19/14 1526  10/24/14 1902  11/06/14 0500 11/07/14 0444 11/08/14 0209  WBC 10.9*  < > 19.1*  < > 10.9* 10.3 10.9*  NEUTROABS 7.2  --  16.2*  --   --   --    --   HGB 15.2  < > 11.9*  < > 10.6* 10.3* 11.8*  HCT 44.4  < > 34.8*  < > 31.2* 30.2* 34.8*  MCV 93.3  < > 92.3  < > 91.2 91.5 91.1  PLT 196  < > 237  < > 375 379 376  < > = values in this interval not displayed.   Assessment/Plan  Physical deconditioning Will have him work with physical therapy and occupational therapy team to help with gait training and muscle strengthening exercises.fall precautions. Skin care. Encourage to be out of bed.   Left hip fracture  S/P IM Nail. To work with PT and OT, fall prevention., has follow up with orthopedics. Continue tylenol 650 mg q4h prn pain. Continue xarelto for dvt prophylaxis  afib Rate controlled, continue amiodarone 200 gm daily, metoprolol 25 mg tid and xarelto 20 mg daily  Ileus  S/P decompression 2 . Had bowel movement today, monitor  Protein calorie malnutrition Monitor clinically, monitor weight and po intake. Continue procel  Insomnia Add melatonin 3 mg qhs and monitor  Dm with renal impairment a1c 7.7. Continue lantus 20 u daily and monitor cbg, continue crestor 10 mg daily  Chronic kidney disease, stage III  Monitor clinically  GERD  continue Protonix 40 mg daily   Goals of care: short term rehabilitation   Labs/tests ordered: cbc, bmp  Family/ staff Communication: reviewed care plan with patient and nursing supervisor    Blanchie Serve, MD  [facility] [phone] (Monday-Friday 8  am - 5 pm) [phone] (afterhours)

## 2014-11-25 ENCOUNTER — Other Ambulatory Visit: Payer: Self-pay

## 2014-11-26 ENCOUNTER — Non-Acute Institutional Stay (SKILLED_NURSING_FACILITY): Payer: Medicare Other | Admitting: Adult Health

## 2014-11-26 ENCOUNTER — Encounter: Payer: Self-pay | Admitting: Adult Health

## 2014-11-26 DIAGNOSIS — E1122 Type 2 diabetes mellitus with diabetic chronic kidney disease: Secondary | ICD-10-CM | POA: Diagnosis not present

## 2014-11-26 DIAGNOSIS — I4891 Unspecified atrial fibrillation: Secondary | ICD-10-CM

## 2014-11-26 DIAGNOSIS — G4700 Insomnia, unspecified: Secondary | ICD-10-CM

## 2014-11-26 DIAGNOSIS — N189 Chronic kidney disease, unspecified: Secondary | ICD-10-CM

## 2014-11-26 DIAGNOSIS — K219 Gastro-esophageal reflux disease without esophagitis: Secondary | ICD-10-CM

## 2014-11-26 DIAGNOSIS — E785 Hyperlipidemia, unspecified: Secondary | ICD-10-CM

## 2014-11-26 DIAGNOSIS — G309 Alzheimer's disease, unspecified: Secondary | ICD-10-CM | POA: Diagnosis not present

## 2014-11-26 DIAGNOSIS — M109 Gout, unspecified: Secondary | ICD-10-CM

## 2014-11-26 DIAGNOSIS — N183 Chronic kidney disease, stage 3 (moderate): Secondary | ICD-10-CM | POA: Diagnosis not present

## 2014-11-26 DIAGNOSIS — R5381 Other malaise: Secondary | ICD-10-CM | POA: Diagnosis not present

## 2014-11-26 DIAGNOSIS — F0280 Dementia in other diseases classified elsewhere without behavioral disturbance: Secondary | ICD-10-CM

## 2014-11-26 DIAGNOSIS — S72142S Displaced intertrochanteric fracture of left femur, sequela: Secondary | ICD-10-CM

## 2014-11-26 NOTE — Progress Notes (Signed)
Patient ID: Howard Charles, male   DOB: 02-28-1936, 79 y.o.   MRN: [id number]   11/26/2014  Facility:  [facility]urs[facility] Home Location:  [place] Room Number: 1007-P LEVEL OF CARE:  SNF (31)    Chief Complaint  Patient presents with  . Discharge Note    Physical deconditioning, left hip fracture S/P IM Nail, Ileus, atrial fibrillation, chronic kidney disease stage III, diabetes mellitus, hypertension, gout, GERD, hyperlipidemia, insomnia and Alzheimer's disease    HISTORY OF PRESENT ILLNESS:  This is a 79 year old male who has been admitted to [facility] on 11/12/14 from [facility]. He has PMH of diabetes mellitus type 2, diabetic neuropathy, hypertension, GERD, left and right knee DJD, bilateral hearing loss, chronic renal insufficiency stage III, dysrhythmia and on supplemental oxygen therapy. He fell in a store and sustained a left hip fracture for which he had IM nail on his 10/18/58. He had A. fib with RVR post of requiring Cardizem drip. Cardiology was consulted and Cardizem was changed to amiodarone. He also developed ileus which required decompression 2.  Patient was admitted to this facility for short-term rehabilitation after the patient's recent hospitalization.  Patient has completed SNF rehabilitation and therapy has cleared the patient for discharge.   PAST MEDICAL HISTORY:  Past Medical History  Diagnosis Date  . Diabetes mellitus type 2 in obese   . Diabetic neuropathy   . Hypertension   . GERD (gastroesophageal reflux disease)   . Gout   . Left knee DJD   . Right knee DJD   . Bilateral hearing loss     WEARS HEARING AIDS........RIGHT IS BETTER THEN LEFT  . Chronic renal insufficiency, stage III (moderate)     Creatinine 1.47  . Dysrhythmia     h/o A Fib  . On supplemental oxygen therapy     at night  . Ileus, postoperative 10/22/2014  . PONV (postoperative nausea and vomiting)     H/O ....BUT LAST FEW TIMES---NONE    CURRENT  MEDICATIONS: Reviewed per MAR/see medication list  No Known Allergies   REVIEW OF SYSTEMS:  GENERAL: no change in appetite, no fatigue, no weight changes, no fever, chills or weakness RESPIRATORY: no cough, SOB, DOE, wheezing, hemoptysis CARDIAC: no chest pain, edema or palpitations GI: no abdominal pain, diarrhea, constipation, heart burn, nausea or vomiting  PHYSICAL EXAMINATION  GENERAL: no acute distress, overweight Skin:  Bilateral cheeks has dry rashes;  Left hip surgical incision is dry, no erythema NECK: supple, trachea midline, no neck masses, no thyroid tenderness, no thyromegaly LYMPHATICS: no LAN in the neck, no supraclavicular LAN RESPIRATORY: breathing is even & unlabored, BS CTAB CARDIAC: RRR, no murmur,no extra heart sounds, no edema GI: abdomen soft, normal BS, no masses, no tenderness, no hepatomegaly, no splenomegaly EXTREMITIES:  able to move 4 extremities; walks with walker PSYCHIATRIC: the patient is alert & oriented to person, affect & behavior appropriate  LABS/RADIOLOGY: Labs reviewed: 11/20/14  WBC 8.9 hemoglobin 10.5 hematocrit and 1.4 MCV 94.6 platelet 352 11/15/14  KUB shows moderate gas in mildly distended loops:. Findings may represent ileus, mild osteoporosis and mild spondylosis 11/14/14  WBC 10.2 hemoglobin 11.1 hematocrit 33.7 MCV 93.6 platelet 400 sodium 138 potassium 3.4 glucose 86 BUN 15 creatinine 1.45 calcium 8.7 Basic Metabolic Panel:  Recent Labs  10/24/14 0448  11/02/14 0347  11/04/14 1425 11/05/14 0436 11/06/14 0500 11/07/14 0444 11/08/14 0209 11/10/14 0435 11/14/14 1206  NA 137  < > 132*  < >  129* 132* 133* 135 133* 134* 138  K 3.6  < > 3.9  < > 2.8* 3.2* 3.1* 3.2* 4.1 3.6 3.4  CL 106  < > 99*  < > 93* 100* 101 103 103 103  --   CO2 23  < > 23  < > 20* 21* 21* 23 20* 22  --   GLUCOSE 136*  < > 142*  < > 376* 87 115* 101* 124* 110*  --   BUN 38*  < > 20  < > 14 11 12 9 9 9 15   CREATININE 1.64*  < > 1.80*  < > 1.32* 1.37* 1.30*  1.24 1.24 1.34* 1.4*  CALCIUM 8.7*  < > 8.8*  < > 8.4* 8.8* 8.5* 8.5* 9.1 9.0  --   MG 1.9  < > 1.7  --  1.6* 1.6* 2.0  --   --   --   --   PHOS 2.2*  --  3.6  --   --   --  3.2  --   --   --   --   < > = values in this interval not displayed. Liver Function Tests:  Recent Labs  11/02/14 0347 11/06/14 0500  AST 29 31  ALT 53 48  ALKPHOS 108 129*  BILITOT 1.1 1.0  PROT 5.0* 5.0*  ALBUMIN 2.3* 2.2*    Recent Labs  11/05/14 0955  LIPASE 28  AMYLASE 39   CBC:  Recent Labs  10/19/14 1526  10/24/14 1902  11/06/14 0500 11/07/14 0444 11/08/14 0209 11/14/14 1206  WBC 10.9*  < > 19.1*  < > 10.9* 10.3 10.9* 10.2  NEUTROABS 7.2  --  16.2*  --   --   --   --   --   HGB 15.2  < > 11.9*  < > 10.6* 10.3* 11.8* 11.1*  HCT 44.4  < > 34.8*  < > 31.2* 30.2* 34.8* 34*  MCV 93.3  < > 92.3  < > 91.2 91.5 91.1  --   PLT 196  < > 237  < > 375 379 376  --   < > = values in this interval not displayed.  CBG:  Recent Labs  11/09/14 0800 11/09/14 1108 11/11/14 2210  GLUCAP 137* 122* 126*    Dg Cervical Spine 2 Or 3 Views  11/06/2014   CLINICAL DATA:  Neck pain radiating to the top of the shoulders for several days. No known injury.  EXAM: CERVICAL SPINE - 2-3 VIEW  COMPARISON:  None.  FINDINGS: C1 to the superior endplate of C7 is imaged on the provided lateral radiograph. The cervical thoracic junction is obscured, even on the provided swimmer's radiograph secondary to overlying osseous soft tissue structures.  The head is held in a minimal amount of left lateral flexion, presumably positional. No definite anterolisthesis or retrolisthesis.  Cervical vertebral body heights are preserved. Prevertebral soft tissues are normal.  Mild-to-moderate multilevel cervical spine DDD, worse at C6-C7 with disc space height loss, endplate irregularity and sclerosis.  There is partial ossification of the nuchal ligament posterior to the C4 and C5 spinous process ease as well as about the tip of the C7  spinous process.  Regional soft tissues appear otherwise normal. A nasoenteric tube tip terminates below the imaged field of view.  IMPRESSION: 1. No acute findings. 2. Mild-to-moderate multilevel cervical spine DDD, worse at C6-C7.   Electronically Signed   By: Sandi Mariscal M.D.   On: 11/06/2014 19:33  Dg Abd 1 View  11/01/2014   CLINICAL DATA:  Post decompression of the colon.  EXAM: ABDOMEN - 1 VIEW  COMPARISON:  11/01/2014  FINDINGS: Decreasing gaseous distention of the colon since prior study. Continued small bowel dilatation. Prior cholecystectomy. No free air organomegaly.  IMPRESSION: Decreasing gaseous distention of the colon with continued moderate gaseous distension of small bowel.   Electronically Signed   By: Rolm Baptise M.D.   On: 11/01/2014 12:38   Dg Abd 1 View  10/29/2014   CLINICAL DATA:  Abdominal pain.  Ileus.  EXAM: ABDOMEN - 1 VIEW  COMPARISON:  10/28/2014  FINDINGS: Last diffuse gaseous distention of the colon is again demonstrated. Cecum measures approximately 10 cm in diameter which is slightly decreased since previous study. No definite small bowel dilatation seen. Pelvic surgical clips again seen from prior prostatectomy and iliac lymph node dissection.  IMPRESSION: Diffuse colonic distention shows mild improvement since previous study.   Electronically Signed   By: Earle Gell M.D.   On: 10/29/2014 08:32   Dg Abd 1 View  10/28/2014   CLINICAL DATA:  Abdominal distention, followup ileus  EXAM: ABDOMEN - 1 VIEW  COMPARISON:  10/27/2014  FINDINGS: Scattered large and small bowel gas is noted. Distension of the colon is again seen. The mid transverse colon now measures 9.6 cm which is a slight increase when compared with the prior exam. The cecum measures approximately 11.7 cm which is also increased from the prior exam. No free air is seen. No other focal abnormality is noted.  IMPRESSION: Persistent and slightly worsened colonic ileus.   Electronically Signed   By: Inez Catalina  M.D.   On: 10/28/2014 08:30   Ct Abdomen Pelvis W Contrast  11/04/2014   CLINICAL DATA:  79 year old male with history of small bowel obstruction or ileus following left hip surgery. Abdominal distention.  EXAM: CT ABDOMEN AND PELVIS WITH CONTRAST  TECHNIQUE: Multidetector CT imaging of the abdomen and pelvis was performed using the standard protocol following bolus administration of intravenous contrast.  CONTRAST:  149mL OMNIPAQUE IOHEXOL 300 MG/ML  SOLN  COMPARISON:  CT the abdomen and pelvis 10/24/2014.  FINDINGS: Lower chest: Small hiatal hernia. Nasogastric tube. Scattered linear opacities in the lungs bilaterally (right greater than left), compatible with areas of subsegmental atelectasis and/or scarring.  Hepatobiliary: No cystic or solid hepatic lesions. No intra or extrahepatic biliary ductal dilatation. Status post cholecystectomy.  Pancreas: No pancreatic mass. No pancreatic ductal dilatation. No pancreatic or peripancreatic fluid or inflammatory changes.  Spleen: Unremarkable.  Adrenals/Urinary Tract: 2.9 cm well-defined low-attenuation nonenhancing lesion in the lower pole of the left kidney is compatible with a simple cyst. Right kidney is normal in appearance. Bilateral adrenal glands are normal in appearance. No hydroureteronephrosis. Urinary bladder is normal in appearance.  Stomach/Bowel: Normal appearance of the stomach. Nasogastric tube extends into the third portion of the duodenum. The mid small bowel remains mildly dilated measuring up to 4.5 cm in diameter. There are multiple air-fluid levels noted. However, there is no discrete transition point identified, and the small bowel gradually transitions to a normal caliber, and ultimately completely decompressed distal small bowel. The colon is remarkable for several areas of colonic wall thickening, most notable in the proximal descending colon, distal transverse colon and ascending colon extending to the level of the hepatic flexure,  suggestive of a colitis. Normal appendix.  Vascular/Lymphatic: Atherosclerosis throughout the abdominal and pelvic vasculature, without evidence of aneurysm or dissection. No lymphadenopathy noted in  the abdomen or pelvis.  Reproductive: Status post prostatectomy. Seminal vesicles are unremarkable in appearance.  Other: No significant volume of ascites.  No pneumoperitoneum.  Musculoskeletal: Status post ORIF in the left femoral neck traversing a femoral neck fracture. There are no aggressive appearing lytic or blastic lesions noted in the visualized portions of the skeleton.  IMPRESSION: 1. Although there is recurrent dilatation of the mid small bowel with multiple air-fluid levels, the findings are favored to reflect a regional small bowel ileus, as there is no transition point to suggest frank obstruction, and no dilatation of the more proximal small bowel (although there is a nasogastric tube in place). 2. Multifocal thickening of the colonic wall, suggestive of colitis. 3. Normal appendix. 4. Small hiatal hernia. 5. Atherosclerosis. 6. Additional incidental findings, as above.   Electronically Signed   By: Vinnie Langton M.D.   On: 11/04/2014 18:02   Dg Abd 2 Views  11/05/2014   CLINICAL DATA:  Abdominal distention, postop left hip surgery  EXAM: ABDOMEN - 2 VIEW  COMPARISON:  CT abdomen pelvis of 11/04/2014  FINDINGS: Supine and erect views the abdomen show both large and small bowel gas without significant distension most consistent with postoperative ileus. No free air is seen on the left lateral decubitus of the abdomen. An NG tube tip overlies the region of the expected descending duodenal loop. Pinning of left hip fracture is present.  IMPRESSION: Probable mild ileus.  No definite bowel obstruction or free air.   Electronically Signed   By: Ivar Drape M.D.   On: 11/05/2014 14:07   Dg Abd 2 Views  11/04/2014   CLINICAL DATA:  Abdominal distention.  EXAM: ABDOMEN - 2 VIEW  COMPARISON:  11/03/2014,  11/01/2014.  FINDINGS: NG tube tip below hemidiaphragms. Persistent prominent small bowel distention with a paucity of intra colonic gas. These findings are consistent with small bowel obstruction. No free air identified. Surgical clips in the pelvis. Postsurgical changes left hip. Degenerative changes lumbar spine right hip.  IMPRESSION: Persistent severely distended small bowel with paucity of intra colonic bowel gas. These findings are consistent with small bowel obstruction. NG tube tip noted below the hemidiaphragms.   Electronically Signed   By: Marcello Moores  Register   On: 11/04/2014 08:29   Dg Abd 2 Views  11/03/2014   CLINICAL DATA:  Abdominal distention, bowel obstruction, nasogastric tube, post LEFT hip surgery, history diabetes, hypertension, GERD  EXAM: ABDOMEN - 2 VIEW  COMPARISON:  11/01/2014  FINDINGS: Unknown position of tip of nasogastric tube, question coiled within a distended stomach versus beyond ligament of Treitz in proximal jejunum.  Persistent significant gaseous distention of small bowel loops.  Paucity of colonic gas.  Significant bowel wall thickening of the cecum and ascending colon high suspicious for colitis.  No free intraperitoneal air.  Mild distention of the urinary bladder.  Catheter projects over inferior bladder.  BILATERAL pelvic surgical clips.  Bones demineralized with orthopedic harbor noted at the proximal LEFT femur.  IMPRESSION: Significant bowel wall thickening of cecum and ascending colon highly suspicious for colitis.  Persistent gaseous distention of small bowel loops which could be related to obstruction or ileus.   Electronically Signed   By: Lavonia Dana M.D.   On: 11/03/2014 10:47   Dg Abd 2 Views  11/01/2014   CLINICAL DATA:  Abdominal distention, vomiting, status post hip placement last week.  EXAM: ABDOMEN - 2 VIEW  COMPARISON:  10/31/2014  FINDINGS: There is diffuse gaseous distention of large  and small bowel. Cecum is approximately 13-14 cm in diameter. This is  larger than prior study. The cecum was not well visualized or measurable previously. Proximal transverse colon measures 11-12 cm in diameter, compared with 10 cm previously.  No free air. No organomegaly. Prior cholecystectomy. Surgical clips in the pelvis. No acute bony abnormality.  IMPRESSION: Diffuse marked gaseous distention of large and small bowel, likely worsening since prior study. No free air.   Electronically Signed   By: Rolm Baptise M.D.   On: 11/01/2014 10:49   Dg Abd Portable 1v  10/31/2014   CLINICAL DATA:  Follow-up ileus  EXAM: PORTABLE ABDOMEN - 1 VIEW  COMPARISON:  None.  FINDINGS: Persistent gaseous colonic distension. Some gaseous distended small bowel loops.  IMPRESSION: Persistent colonic and small bowel gaseous distension. Findings suspicious for significant ileus   Electronically Signed   By: Lahoma Crocker M.D.   On: 10/31/2014 13:19   Dg Abd Portable 1v  10/30/2014   CLINICAL DATA:  Ileus  EXAM: PORTABLE ABDOMEN - 1 VIEW  COMPARISON:  10/29/2014  FINDINGS: Diffuse gaseous distension of the: Again noted. Transverse colon measures approximately 9 cm in diameter. C gum measures approximately 10 cm in diameter. Findings are similar to prior study.  Prior cholecystectomy. Surgical clips in the pelvis. No visible free air.  IMPRESSION: Continued diffuse colonic distention, unchanged.   Electronically Signed   By: Rolm Baptise M.D.   On: 10/30/2014 10:59   Dg Femur Min 2 Views Left  11/06/2014   CLINICAL DATA:  ORIF femur fracture.  EXAM: LEFT FEMUR 2 VIEWS  COMPARISON:  10/19/2014  FINDINGS: Intra medullary rod and dynamic screw has been placed, transfixing an intertrochanteric left femur fracture. Anatomic alignment of the fracture fragments. No breakage or loosening of the hardware. Left total knee arthroplasty is also present and anatomically aligned. Osteopenia.  IMPRESSION: ORIF left intertrochanteric femur fracture anatomically aligned.   Electronically Signed   By: Marybelle Killings M.D.    On: 11/06/2014 17:37    ASSESSMENT/PLAN:  Physical deconditioning - for Home health PT, OT and Nursing Left hip fracture S/P IM Nail - follow-up with Dr. [name], orthopedic surgeon; continue Tylenol 650 mg PO Q 4 hours PRN Ileus S/P decompression 2 -  Will minimize narcotic analgesia; pain is well-controlled; resolved Atrial fibrillation with RVR - rate controlled; continue amiodarone 200 mg by mouth daily, Xarelto 20 mg 1 tab by mouth every morning and metoprolol tartrate 25 mg by mouth 3 times a day; follow-up with Dr. Einar Gip, cardiology Chronic kidney disease, stage III  - creatinine 1.45; talked to patient to follow-up with PCP Diabetes mellitus, type II in obese  - hemoglobin A1c 7.7; continue Lantus 20 units subcutaneous daily at bedtime  Hypertension - well controlled; continue metoprolol 25 mg by mouth 3 times a day  Gout - continue allopurinol 300 mg by mouth every morning GERD - continue Protonix 40 mg by mouth every evening Hyperlipidemia - continue Crestor 10 mg 1 tab by mouth every evening Alzheimer's disease - stable    I have filled out patient's discharge paperwork and written prescriptions.  Patient will receive home health PT, OT and Nursing.  Total discharge time: Less than 30 minutes  Discharge time involved coordination of the discharge process with Education officer, museum, nursing staff and therapy department. Medical justification for home health services verified.   [facility], NP [employer] [phone]

## 2014-11-28 DIAGNOSIS — Z96642 Presence of left artificial hip joint: Secondary | ICD-10-CM | POA: Diagnosis not present

## 2014-11-28 DIAGNOSIS — I129 Hypertensive chronic kidney disease with stage 1 through stage 4 chronic kidney disease, or unspecified chronic kidney disease: Secondary | ICD-10-CM | POA: Diagnosis not present

## 2014-11-28 DIAGNOSIS — Z471 Aftercare following joint replacement surgery: Secondary | ICD-10-CM | POA: Diagnosis not present

## 2014-11-28 DIAGNOSIS — E119 Type 2 diabetes mellitus without complications: Secondary | ICD-10-CM | POA: Diagnosis not present

## 2014-11-28 DIAGNOSIS — M6281 Muscle weakness (generalized): Secondary | ICD-10-CM | POA: Diagnosis not present

## 2014-11-28 DIAGNOSIS — N183 Chronic kidney disease, stage 3 (moderate): Secondary | ICD-10-CM | POA: Diagnosis not present

## 2015-01-16 ENCOUNTER — Other Ambulatory Visit: Payer: Self-pay | Admitting: Adult Health

## 2015-09-29 NOTE — H&P (Signed)
OFFICE VISIT NOTES COPIED TO EPIC FOR DOCUMENTATION  . History of Present Illness Laverda Page MD; 09/22/2015 7:16 PM) Patient words: Last O/V 09/11/2015; 10 day F/U with EKG for A-Fib.  The patient is a 80 year old male who presents for a follow-up for Atrial fibrillation. Patient is a Caucasian male with history of paroxysmal atrial fibrillation/atrial flutter, history of chronic renal insufficiency, diabetes mellitus and hyperlipidemia who presents here for follow-up of atrial fibrillation.  In June 2016 after falling and fracturing his hip, he developed post-operative ileus while in the hospital and had multiple episodes of a.fib with RVR. He was discharged to rehab facility and he had a protracted recovery and lost about 30-35 Lbs in weight. However during f/u, he had gained all the weight back and was also found to be in atrial fibrillation although controlled ventricular response. Or the past 4-6 weeks or maybe 2 months he has noticed easy fatigability and also dyspnea on exertion. No PND or orthopnea. No leg edema. I have started him on flecainide with plans on direct current cardioversion in 2-3 weeks. He now presents here for follow-up to evaluate his EKG and symptoms. No significant change in dyspnea or fatigue.   Problem List/Past Medical (April Garrison; 09/22/2015 9:37 AM) Atrial flutter, paroxysmal (I48.92)  EKG 07/20/2012 NSR, normal intervals. NO ischemia. Normal EKG. No change from EKG 07/04/2012. Exercise sestamibi 02/25/12: Normal perfusion images. No ischemia. Normal LVEF. Echo 06/17/11: Normal LVEF, mild LVH. normal diastolic function. No signficant valvular abnormality. Other dyspnea and respiratory abnormality (R06.09, R09.89)  Essential hypertension, benign (I10)  Diabetes mellitus with renal manifestations, controlled (E11.21)  Mixed hyperlipidemia (E78.2)  Long term current use of anticoagulant therapy (Z79.01)  Nocturnal hypoxemia due to obesity (E66.9)   Nocturnal oximetry 02/26/2013: PO2 less than 88% 20 minutes, less than 89% 58 minutes, lowest SPO2 83%. Total desaturation events 172, average events per hour 21. Patient qualifies for oxygen supplementation. Shortness of breath on exertion (R06.02)  Lexiscan myoview stress test 09/01/2015: 1. The resting electrocardiogram demonstrated atrial fibrillation and normal resting conduction. Stress EKG is non-diagnostic for ischemia as it a pharmacologic stress using Lexiscan. Stress symptoms included dyspnea. 2. Myocardial perfusion imaging is normal. Overall left ventricular systolic function was normal without regional wall motion abnormalities. The left ventricular ejection fraction was 61%. Echocardiogram 09/02/2015: 1. Left ventricle cavity is normal in size. Mild to moderate concentric hypertrophy of the left ventricle. Basal septal hypertrophy. Normal global wall motion. Unable to evaluate diastolic function due to A. Fibrillation. Calculated EF 53%. 2. Left atrial cavity is moderate to severely dilated. Right atrial cavity is mildly dilated. 3. Mild mitral valve leaflet calcification. Mild mitral regurgitation. 4. Mild tricuspid regurgitation. Mild to moderate pulmonary hypertension. Pulmonary artery systolic pressure is estimated at 40 mm Hg. 5. IVC is dilated with a respiratory response of <50%. This suggests elevated right heart pressure. Overall commpared to the study done on 09/17/2014, no significant change. Bilateral edema of lower extremity (R60.0)  Labwork  Labs 06/11/2015: Total cholesterol 130, triglycerides 140, HDL 35, LDL 67. BUN 28, serum creatinine 1.77, eGFR 36 mL. Potassium 4.8. CMP otherwise normal. Labs 03/10/2015: HbA1c 7.3%, total cholesterol 140, triglycerides 120, HDL 37, LDL 79, CBC normal, serum glucose 130, creatinine 1.64, eGFR 40, potassium 4.6 Labs 01/18/2014: total cholesterol 125, HDL 29, LDL 61, triglycerides 173 Labs 01/18/2014: HbA1c 7.3%, creatinine 1.80, BUN 31, eGFR 36,  serum glucose 164, CMP otherwise normal. Labs 06/29/2012: BUN 42, serum creatinine 1.40. Paroxysmal atrial fibrillation (  I48.0) 06/27/2011 CHA2DS2-VASc Score is 3 with yearly risk of stroke of 3.2%. Bleeding risk is 0.9/year. Patient consents for being on long-term anticoagulants. On Xarelto. Admitted to [facility] 5/21-6/15/2016 s/p fall with hip fx, post-op ileus. Frequent episodes of a.fib with RVR. Discharged on amiodarone and metoprolol tid. Admitted to [facility] on 06/27/2011 for left knee replacement and had paroxysmal A. Flutter short duration. Mobitz I and II block at night, asymptomatic. Event Monitor 06/29/12 to 07/14/12: 2 Episodes of A. Fib with RVR.  Allergies (April Garrison; 10-22-15 9:37 AM) No Known Drug Allergies 07/03/2012  Family History (April Garrison; 10-22-2015 9:37 AM) Mother  Deceased. at age 67, from Alzheimer's; Known Hypertension, Diabetes Father  Deceased. at age 77; h/o several MI's starting in his late 70's; Known Diabetes Siblings  3 siblings with Diabetes  Social History (April Garrison; 10-22-2015 9:37 AM) Current tobacco use  Never smoker. Non Drinker/No Alcohol Use  Marital status  Married. Number of Children  2. Living Situation  Lives with spouse.  Past Surgical History (April Garrison; 10/22/2015 9:37 AM) Cholecystectomy approx. 20 years ago  Prostate cancer 1995 (removed prostate).  Arthroscopic surgery on both knees.  Right shoulder surgery2007.  Knee Replacement, Total 06/26/2012 Left. Total Knee Replacement - Right 04/2013 Repair for left hip fracture 10/2014  Medication History (April Garrison; 22-Oct-2015 9:44 AM) Flecainide Acetate (100MG Tablet, 1 (one) Tablet Oral two times daily, Taken starting 09/11/2015) Active. Xarelto (20MG Tablet, 1 (one) Tablet Tablet Tablet Oral daily, Taken starting 09/26/2014) Active. (06/2015-decreased from 46m by PCP; pt will be switching to the 17mafter he finishes the 2095mupply) Micardis  HCT (80-25MG Tablet, 1/2 Oral every morning, Taken starting 08/27/2015) Active. (Decreased due to low BP) Allopurinol (300MG Tablet, 1 Oral daily) Active. Crestor (10MG Tablet, 1 Oral every evening) Active. Lantus SoloStar (100UNIT/ML Solution, 56 ML Subcutaneous daily) Active. Centrum Silver (1 Oral daily) Active. Acetaminophen (325MG Tablet, 2 Oral every six hours as needed for pain) Active. Metoprolol Tartrate (25MG Tablet, 1 Oral three times daily) Active. Lovaza (1GM Capsule, 2 Oral two times daily) Active. Onglyza (5MG Tablet, 1 Oral daily) Active. HumaLOG Mix 50/50 KwikPen ((50-50) 100UNIT/ML Susp Pen-inj, 10 U Subcutaneous daily) Active. Chlorhexidine Gluconate (0.12% Solution, Mouth/Throat daily) Discontinued: Completed course. Medications Reconciled (verbally)  Diagnostic Studies History (April Garrison; 4/2May 24, 201737 AM) Nuclear stress test 2007 (normal).  Exercise Myoview stress test 02/26/2013: 1. Resting EKG showed normal sinus rhythm, Stress EKG was negative for ischemia. Patient exercised on Bruce protocol for 6 minutes 0 secs. The maximum work level achieved was 7.3 MET's. Hypertensive BP response. The baseline blood pressure was 180/92 mmHg and 208/92 mmHg with exercise. The test was terminated due to achievement of the target heart rate. Patient c/o headache with exercise. 2. The perfusion imaging study demonstrates very mild diaphragmatic attenuation artifact in the inferior wall, a small sized inferior wall ischemia cannot completely excluded although this does not reach statistical significance by polar plot images. Dynamic gated images reveal normal wall motion and endocardial thickening. Left ventricular ejection fraction was estimated to be 59%. This represents a low risk study. Met test 05/12/11.  Colonoscopy 11/12 (removed benign polyps)  Nocturnal oxymetry 02/26/2013:  PO2 less than 88% 20 minutes, less than 89% 58 minutes, lowest SPO2 83%. Total  desaturation events 172, average events per hour 21. Patient qualifies for oxygen supplementation. Echocardiogram  03/05/2013: 1. Left ventricular internal dimension is decreased. Moderate concentric hypertrophy. Mild septal notching. Diastolic filling with pseudonormal pattern and elevated mean left  atrial pressure. Normal global wall motion. Normal systolic global function. Calculated EF 60%. Doppler evidence of Grade II (pseudonormal) diastolic dysfunction. 2. Left atrial cavity is mildly dilated. Mild aneurysmal motion of the interatrial septum. 3. Trace tricuspid regurgitation. No pulmonary hypertension. Colonoscopy 11/01/2014 Nuclear stress test 09/01/2015 1. The resting electrocardiogram demonstrated atrial fibrillation and normal resting conduction. Stress EKG is non-diagnostic for ischemia as it a pharmacologic stress using Lexiscan. Stress symptoms included dyspnea. 2. Myocardial perfusion imaging is normal. Overall left ventricular systolic function was normal without regional wall motion abnormalities. The left ventricular ejection fraction was 61%.  Other Problems (April Louretta Shorten; 09/22/2015 9:37 AM) Unspecified Diagnosis     Review of Systems Laverda Page MD; 09/22/2015 7:16 PM) General Present- Fatigue. Not Present- Tiredness and Unable to Sleep Lying Flat. HEENT Not Present- Blurred Vision. Respiratory Present- Decreased Exercise Tolerance and Difficulty Breathing on Exertion. Not Present- Bloody sputum, Wakes up from Sleep Wheezing or Short of Breath and Wheezing. Cardiovascular Not Present- Chest Pain, Edema, Leg Cramps and Palpitations. Gastrointestinal Not Present- Black, Tarry Stool, Bloody Stool and Heartburn. Musculoskeletal Present- Joint Pain (chronic; bilateral knee and tail bone and left fingers, left hip). Not Present- Claudication and Physical Disability. Neurological Not Present- Focal Neurological Symptoms. Psychiatric Not Present- Personality Changes and  Suicidal Ideation. Hematology Not Present- Blood Clots, Easy Bruising and Nose Bleed.  Vitals (April Garrison; 09/22/2015 9:53 AM) 09/22/2015 9:39 AM Weight: 210.19 lb Height: 70in Body Surface Area: 2.13 m Body Mass Index: 30.16 kg/m  Pulse: 77 (Irregular)  P.OX: 97% (Room air) BP: 124/76 (Sitting, Left Arm, Standard)       Physical Exam Laverda Page, MD; 09/22/2015 7:17 PM) General Mental Status-Alert. General Appearance-Cooperative, Appears stated age, Not in acute distress. Orientation-Oriented X3. Build & Nutrition-Well built and Well nourished.  Head and Neck Thyroid Gland Characteristics - no palpable nodules, no palpable enlargement.  Chest and Lung Exam Chest and lung exam reveals -on auscultation, normal breath sounds, no adventitious sounds. Palpation Tender - No chest wall tenderness. Auscultation Breath sounds - Clear.  Cardiovascular Inspection Jugular vein - Right - No Distention. Auscultation Rhythm - Irregularly irregular. Heart Sounds - S1 is variable, S2 WNL and No gallop present. Murmurs & Other Heart Sounds - Normal exam - No Murmurs.  Abdomen Inspection Contour - Obese. Palpation/Percussion Normal exam - Non Tender and No hepatosplenomegaly. Auscultation Normal exam - Bowel sounds normal.  Peripheral Vascular Lower Extremity Inspection - Left - No Pigmentation, No Varicose veins. Right - No Pigmentation, No Varicose veins. Palpation - Edema - Bilateral - Trace edema. Femoral pulse - Bilateral - Normal. Popliteal pulse - Bilateral - Normal. Dorsalis pedis pulse - Bilateral - Normal. Posterior tibial pulse - Bilateral - Normal. Carotid arteries - Bilateral-No Carotid bruit. Abdomen-No prominent abdominal aortic pulsation, No epigastric bruit.  Neurologic Motor-Grossly intact without any focal deficits.  Musculoskeletal Global Assessment Left Lower Extremity - normal range of motion without pain. Right Lower  Extremity - normal range of motion without pain.    Assessment & Plan Laverda Page MD; 09/22/2015 7:18 PM)  Paroxysmal atrial fibrillation (I48.0) Story: CHA2DS2-VASc Score is 3 with yearly risk of stroke of 3.2%. Bleeding risk is 0.9/year. Patient consents for being on long-term anticoagulants. On Xarelto.  Admitted to [facility] 5/21-6/15/2016 s/p fall with hip fx, post-op ileus. Frequent episodes of a.fib with RVR. Discharged on amiodarone and metoprolol tid.  Admitted to [facility] on 06/27/2011 for left knee replacement and had paroxysmal A. Flutter short  duration. Mobitz I and II block at night, asymptomatic.  Event Monitor 06/29/12 to 07/14/12: 2 Episodes of A. Fib with RVR. Impression: EKG 09/22/2015: Atrial fibrillation with controlled response at the rate of 77 bpm, rightward axis, incomplete right branch block. Normal QT interval. No significant change from EKG 09/11/2015.  EKG 12/20/2014: Normal sinus rhythm at rate of 62 bpm, left atrial enlargement. PAC, no evidence of ischemia. Normal QT interval. No change from EKG 11/27/2014. Current Plans Complete electrocardiogram (93000) Future Plans 8/32/9191: METABOLIC PANEL, BASIC (66060) - one time  Shortness of breath on exertion (R06.02)  Story: Lexiscan myoview stress test 09/01/2015: 1. The resting electrocardiogram demonstrated atrial fibrillation and normal resting conduction. Stress EKG is non-diagnostic for ischemia as it a pharmacologic stress using Lexiscan. Stress symptoms included dyspnea. 2. Myocardial perfusion imaging is normal. Overall left ventricular systolic function was normal without regional wall motion abnormalities. The left ventricular ejection fraction was 61%.  Echocardiogram 09/02/2015: 1. Left ventricle cavity is normal in size. Mild to moderate concentric hypertrophy of the left ventricle. Basal septal hypertrophy. Normal global wall motion. Unable to evaluate diastolic function due to A.  Fibrillation. Calculated EF 53%. 2. Left atrial cavity is moderate to severely dilated. Right atrial cavity is mildly dilated. 3. Mild mitral valve leaflet calcification. Mild mitral regurgitation. 4. Mild tricuspid regurgitation. Mild to moderate pulmonary hypertension. Pulmonary artery systolic pressure is estimated at 40 mm Hg. 5. IVC is dilated with a respiratory response of <50%. This suggests elevated right heart pressure. Overall commpared to the study done on 09/17/2014, no significant change.  Essential hypertension, benign (I10)  Controlled type 2 diabetes mellitus with stage 3 chronic kidney disease, with long-term current use of insulin (E11.22)  Labwork Story: Labs 06/11/2015: Total cholesterol 130, triglycerides 140, HDL 35, LDL 67. BUN 28, serum creatinine 1.77, eGFR 36 mL. Potassium 4.8. CMP otherwise normal.  Labs 03/10/2015: HbA1c 7.3%, total cholesterol 140, triglycerides 120, HDL 37, LDL 79, CBC normal, serum glucose 130, creatinine 1.64, eGFR 40, potassium 4.6  Labs 01/18/2014: total cholesterol 125, HDL 29, LDL 61, triglycerides 173  Labs 01/18/2014: HbA1c 7.3%, creatinine 1.80, BUN 31, eGFR 36, serum glucose 164, CMP otherwise normal. Labs 06/29/2012: BUN 42, serum creatinine 1.40.   Current Plans Mechanism of underlying disease process and action of medications discussed with the patient. I discussed primary/secondary prevention and also dietary counseling was done. He presents here for follow-up and evaluation of paroxysmal atrial fibrillation. He had noticed worsening dyspnea over the past 1-2 months with worsening fatigue, and was scheduled for Lexiscan Myoview stress test, which was negative for evidence of ischemia, as well as echocardiogram that was stable from previously.  Suspect symptoms are secondary to a.fib and he was started on flecainide. He remains in a.fib today. Schedule for Direct current cardioversion. I have discussed regarding risks benefits rate  control vs rhythm control with the patient. Patient understands cardiac arrest and need for CPR, aspiration pneumonia, but not limited to these. Patient is willing. Follow up after procedure. If he fails flecainide, then eitherAmio or Tikosyn are his choices.  CC: Dr. Deland Pretty.    Signed by Laverda Page, MD (09/22/2015 7:18 PM)

## 2015-10-01 ENCOUNTER — Encounter (HOSPITAL_COMMUNITY): Payer: Self-pay | Admitting: Certified Registered Nurse Anesthetist

## 2015-10-01 ENCOUNTER — Ambulatory Visit (HOSPITAL_COMMUNITY)
Admission: RE | Admit: 2015-10-01 | Discharge: 2015-10-01 | Disposition: A | Discharge status: Home / Self Care | Payer: Medicare Other | Source: Ambulatory Visit | Attending: Cardiology | Admitting: Cardiology

## 2015-10-01 ENCOUNTER — Encounter (HOSPITAL_COMMUNITY)
Admission: RE | Disposition: A | Discharge status: Home / Self Care | Payer: Self-pay | Source: Ambulatory Visit | Attending: Cardiology

## 2015-10-01 DIAGNOSIS — Z6830 Body mass index (BMI) 30.0-30.9, adult: Secondary | ICD-10-CM | POA: Insufficient documentation

## 2015-10-01 DIAGNOSIS — E669 Obesity, unspecified: Secondary | ICD-10-CM | POA: Insufficient documentation

## 2015-10-01 DIAGNOSIS — Z79899 Other long term (current) drug therapy: Secondary | ICD-10-CM | POA: Insufficient documentation

## 2015-10-01 DIAGNOSIS — I4892 Unspecified atrial flutter: Secondary | ICD-10-CM | POA: Diagnosis not present

## 2015-10-01 DIAGNOSIS — N183 Chronic kidney disease, stage 3 (moderate): Secondary | ICD-10-CM | POA: Diagnosis not present

## 2015-10-01 DIAGNOSIS — I4891 Unspecified atrial fibrillation: Secondary | ICD-10-CM | POA: Diagnosis present

## 2015-10-01 DIAGNOSIS — Z8546 Personal history of malignant neoplasm of prostate: Secondary | ICD-10-CM | POA: Insufficient documentation

## 2015-10-01 DIAGNOSIS — I480 Paroxysmal atrial fibrillation: Secondary | ICD-10-CM | POA: Insufficient documentation

## 2015-10-01 DIAGNOSIS — Z7901 Long term (current) use of anticoagulants: Secondary | ICD-10-CM | POA: Diagnosis not present

## 2015-10-01 DIAGNOSIS — Z794 Long term (current) use of insulin: Secondary | ICD-10-CM | POA: Insufficient documentation

## 2015-10-01 DIAGNOSIS — Z96653 Presence of artificial knee joint, bilateral: Secondary | ICD-10-CM | POA: Diagnosis not present

## 2015-10-01 DIAGNOSIS — I129 Hypertensive chronic kidney disease with stage 1 through stage 4 chronic kidney disease, or unspecified chronic kidney disease: Secondary | ICD-10-CM | POA: Diagnosis not present

## 2015-10-01 DIAGNOSIS — E782 Mixed hyperlipidemia: Secondary | ICD-10-CM | POA: Diagnosis not present

## 2015-10-01 DIAGNOSIS — E1122 Type 2 diabetes mellitus with diabetic chronic kidney disease: Secondary | ICD-10-CM | POA: Diagnosis not present

## 2015-10-01 SURGERY — CANCELLED PROCEDURE

## 2015-10-01 NOTE — Progress Notes (Signed)
Pt came for cardioversion today. Upon arrival, pt was in NSR. 12 lead EKG was obtained and Dr. Einar Gip notified. Pt directed to keep follow-up appointment and ok'd to go home.

## 2015-12-23 ENCOUNTER — Encounter (HOSPITAL_COMMUNITY): Payer: Self-pay | Admitting: Vascular Surgery

## 2015-12-23 ENCOUNTER — Emergency Department (HOSPITAL_COMMUNITY)
Admission: EM | Admit: 2015-12-23 | Discharge: 2015-12-23 | Disposition: A | Discharge status: Home / Self Care | Payer: Medicare Other | Attending: Emergency Medicine | Admitting: Emergency Medicine

## 2015-12-23 ENCOUNTER — Emergency Department (HOSPITAL_COMMUNITY): Payer: Medicare Other

## 2015-12-23 DIAGNOSIS — Z23 Encounter for immunization: Secondary | ICD-10-CM | POA: Insufficient documentation

## 2015-12-23 DIAGNOSIS — S63501A Unspecified sprain of right wrist, initial encounter: Secondary | ICD-10-CM | POA: Diagnosis not present

## 2015-12-23 DIAGNOSIS — Z79899 Other long term (current) drug therapy: Secondary | ICD-10-CM | POA: Diagnosis not present

## 2015-12-23 DIAGNOSIS — Y9289 Other specified places as the place of occurrence of the external cause: Secondary | ICD-10-CM | POA: Diagnosis not present

## 2015-12-23 DIAGNOSIS — S0990XA Unspecified injury of head, initial encounter: Secondary | ICD-10-CM | POA: Diagnosis not present

## 2015-12-23 DIAGNOSIS — I129 Hypertensive chronic kidney disease with stage 1 through stage 4 chronic kidney disease, or unspecified chronic kidney disease: Secondary | ICD-10-CM | POA: Insufficient documentation

## 2015-12-23 DIAGNOSIS — Y939 Activity, unspecified: Secondary | ICD-10-CM | POA: Diagnosis not present

## 2015-12-23 DIAGNOSIS — Y999 Unspecified external cause status: Secondary | ICD-10-CM | POA: Insufficient documentation

## 2015-12-23 DIAGNOSIS — Z96652 Presence of left artificial knee joint: Secondary | ICD-10-CM | POA: Diagnosis not present

## 2015-12-23 DIAGNOSIS — S0121XA Laceration without foreign body of nose, initial encounter: Secondary | ICD-10-CM | POA: Diagnosis not present

## 2015-12-23 DIAGNOSIS — T148XXA Other injury of unspecified body region, initial encounter: Secondary | ICD-10-CM

## 2015-12-23 DIAGNOSIS — Z794 Long term (current) use of insulin: Secondary | ICD-10-CM | POA: Diagnosis not present

## 2015-12-23 DIAGNOSIS — N183 Chronic kidney disease, stage 3 (moderate): Secondary | ICD-10-CM | POA: Diagnosis not present

## 2015-12-23 DIAGNOSIS — E1140 Type 2 diabetes mellitus with diabetic neuropathy, unspecified: Secondary | ICD-10-CM | POA: Insufficient documentation

## 2015-12-23 DIAGNOSIS — W01110A Fall on same level from slipping, tripping and stumbling with subsequent striking against sharp glass, initial encounter: Secondary | ICD-10-CM | POA: Diagnosis not present

## 2015-12-23 DIAGNOSIS — E1122 Type 2 diabetes mellitus with diabetic chronic kidney disease: Secondary | ICD-10-CM | POA: Diagnosis not present

## 2015-12-23 DIAGNOSIS — Z8546 Personal history of malignant neoplasm of prostate: Secondary | ICD-10-CM | POA: Diagnosis not present

## 2015-12-23 DIAGNOSIS — Z7901 Long term (current) use of anticoagulants: Secondary | ICD-10-CM | POA: Diagnosis not present

## 2015-12-23 DIAGNOSIS — S6991XA Unspecified injury of right wrist, hand and finger(s), initial encounter: Secondary | ICD-10-CM | POA: Diagnosis present

## 2015-12-23 DIAGNOSIS — W19XXXA Unspecified fall, initial encounter: Secondary | ICD-10-CM

## 2015-12-23 DIAGNOSIS — S0083XA Contusion of other part of head, initial encounter: Secondary | ICD-10-CM

## 2015-12-23 LAB — CBC WITH DIFFERENTIAL/PLATELET
BASOS PCT: 0 %
Basophils Absolute: 0.1 10*3/uL (ref 0.0–0.1)
EOS ABS: 0.6 10*3/uL (ref 0.0–0.7)
Eosinophils Relative: 5 %
HEMATOCRIT: 41.7 % (ref 39.0–52.0)
HEMOGLOBIN: 13.7 g/dL (ref 13.0–17.0)
LYMPHS ABS: 2.2 10*3/uL (ref 0.7–4.0)
Lymphocytes Relative: 20 %
MCH: 29.5 pg (ref 26.0–34.0)
MCHC: 32.9 g/dL (ref 30.0–36.0)
MCV: 89.7 fL (ref 78.0–100.0)
MONOS PCT: 9 %
Monocytes Absolute: 1 10*3/uL (ref 0.1–1.0)
NEUTROS ABS: 7.4 10*3/uL (ref 1.7–7.7)
NEUTROS PCT: 66 %
Platelets: 257 10*3/uL (ref 150–400)
RBC: 4.65 MIL/uL (ref 4.22–5.81)
RDW: 16.5 % — ABNORMAL HIGH (ref 11.5–15.5)
WBC: 11.2 10*3/uL — AB (ref 4.0–10.5)

## 2015-12-23 LAB — PROTIME-INR
INR: 1.18 (ref 0.00–1.49)
PROTHROMBIN TIME: 15.1 s (ref 11.6–15.2)

## 2015-12-23 MED ORDER — TETANUS-DIPHTH-ACELL PERTUSSIS 5-2.5-18.5 LF-MCG/0.5 IM SUSP
0.5000 mL | Freq: Once | INTRAMUSCULAR | Status: AC
  Administered 2015-12-23: 0.5 mL via INTRAMUSCULAR
  Filled 2015-12-23: qty 0.5

## 2015-12-23 NOTE — ED Provider Notes (Signed)
[facility] DEPT Provider Note   CSN: [id number] Arrival date & time: 12/23/15  1012  First Provider Contact:  None       History   Chief Complaint Chief Complaint  Patient presents with  . Fall    HPI Howard Charles is a 80 y.o. male.  Pt said that he tripped coming out of his house this am on the way to the gym.  The pt said that he hit his head, but did not have a loc.  He said that his glasses cut the bridge of his nose and that wound bled for a long time.  He is on xarelto for a.fib.  The pt c/o pain to his right wrist.  Pt did hit his right knee, but it does not hurt.    Fall     Past Medical History:  Diagnosis Date  . Bilateral hearing loss    WEARS HEARING AIDS........RIGHT IS BETTER THEN LEFT  . Chronic renal insufficiency, stage III (moderate)    Creatinine 1.47  . Diabetes mellitus type 2 in obese ([place])   . Diabetic neuropathy ([place])   . Dysrhythmia    h/o A Fib  . GERD (gastroesophageal reflux disease)   . Gout   . Hypertension   . Ileus, postoperative 10/22/2014  . Left knee DJD   . On supplemental oxygen therapy    at night  . PONV (postoperative nausea and vomiting)    H/O ....BUT LAST FEW TIMES---NONE  . Right knee DJD     Patient Active Problem List   Diagnosis Date Noted  . Ileus ([place])   . Ileus, postoperative 10/22/2014  . Atrial fibrillation with RVR ([place])   . A-fib ([place]) 10/19/2014  . Prostate cancer ([place]) 10/19/2014  . Closed left hip fracture ([place]) 10/19/2014  . Dyspnea 10/18/2014  . Osteoarthritis of right knee 05/21/2013  . Cardiac arrhythmia  has episodes of atrial flutter and runs of VTach 06/26/2012  . Left knee DJD 06/26/2012  . Diabetes mellitus type 2 in obese ([place])   . Diabetic neuropathy ([place])   . Hypertension   . GERD (gastroesophageal reflux disease)   . Gout   . Bilateral hearing loss   . Chronic renal insufficiency, stage III (moderate)     Past Surgical History:  Procedure Laterality Date  . BOWEL  DECOMPRESSION N/A 10/24/2014   Procedure: BOWEL DECOMPRESSION;  Surgeon: Clarene Essex, MD;  Location: [facility];  Service: Endoscopy;  Laterality: N/A;  . CHOLECYSTECTOMY    . COLONOSCOPY    . COLONOSCOPY N/A 10/24/2014   Procedure: COLONOSCOPY;  Surgeon: Clarene Essex, MD;  Location: [facility] ENDOSCOPY;  Service: Endoscopy;  Laterality: N/A;  . COLONOSCOPY N/A 11/01/2014   Procedure: COLONOSCOPY;  Surgeon: Ronald Lobo, MD;  Location: [facility] ENDOSCOPY;  Service: Endoscopy;  Laterality: N/A;  . HEMORRHOID SURGERY    . INNER EAR SURGERY    . INTRAMEDULLARY (IM) NAIL INTERTROCHANTERIC Left 10/20/2014  . INTRAMEDULLARY (IM) NAIL INTERTROCHANTERIC Left 10/20/2014   Procedure: INTRAMEDULLARY (IM) NAIL INTERTROCHANTRIC;  Surgeon: [name], MD;  Location: [facility];  Service: Orthopedics;  Laterality: Left;  . KNEE ARTHROSCOPY  2008   left   . KNEE ARTHROSCOPY  2012   left  . KNEE ARTHROSCOPY  2002   right  . SHOULDER ARTHROSCOPY WITH ROTATOR CUFF REPAIR  2007  . SUPRAPUBIC PROSTATECTOMY     19 YRS  AGO  . TONSILLECTOMY    . TOTAL KNEE ARTHROPLASTY  06/26/2012   Procedure: TOTAL KNEE  ARTHROPLASTY;  Surgeon: Lorn Junes, MD;  Location: [facility];  Service: Orthopedics;  Laterality: Left;  . TOTAL KNEE ARTHROPLASTY Right 05/21/2013   Procedure: TOTAL KNEE ARTHROPLASTY;  Surgeon: Lorn Junes, MD;  Location: [facility];  Service: Orthopedics;  Laterality: Right;       Home Medications    Prior to Admission medications   Medication Sig Start Date End Date Taking? Authorizing Provider  acetaminophen (TYLENOL) 325 MG tablet Take 2 tablets (650 mg total) by mouth every 6 (six) hours as needed for mild pain (or Fever >/= 101). 11/12/14   Kelvin Cellar, MD  allopurinol (ZYLOPRIM) 300 MG tablet Take 300 mg by mouth every morning.     Historical Provider, MD  amiodarone (PACERONE) 200 MG tablet Take 1 tablet (200 mg total) by mouth daily. 11/12/14   Kelvin Cellar, MD  calcium carbonate (TUMS - DOSED IN MG  ELEMENTAL CALCIUM) 500 MG chewable tablet Chew 1 tablet (200 mg of elemental calcium total) by mouth once. 11/12/14   Kelvin Cellar, MD  insulin glargine (LANTUS) 100 UNIT/ML injection Inject 0.2 mLs (20 Units total) into the skin at bedtime. 11/12/14   Kelvin Cellar, MD  metoCLOPramide (REGLAN) 5 MG tablet Take 1 tablet (5 mg total) by mouth 3 (three) times daily before meals. 11/12/14   Kelvin Cellar, MD  metoprolol tartrate (LOPRESSOR) 25 MG tablet Take 1 tablet (25 mg total) by mouth every 8 (eight) hours. 11/12/14   Kelvin Cellar, MD  Multiple Vitamins-Minerals (CENTRUM SILVER ULTRA MENS PO) Take 1 tablet by mouth daily.    Historical Provider, MD  omega-3 acid ethyl esters (LOVAZA) 1 G capsule Take 1 g by mouth every morning.    Historical Provider, MD  OXYGEN Inhale 2 L into the lungs.    Historical Provider, MD  pantoprazole (PROTONIX) 40 MG tablet Take 40 mg by mouth every evening.     Historical Provider, MD  Protein (PROCEL) POWD Take 1 scoop by mouth 2 (two) times daily. For Nutritional Support    Historical Provider, MD  rivaroxaban (XARELTO) 20 MG TABS tablet Take 1 tablet (20 mg total) by mouth every morning. 10/20/14   [name], MD  rosuvastatin (CRESTOR) 10 MG tablet Take 10 mg by mouth every evening.     Historical Provider, MD  sennosides-docusate sodium (SENOKOT-S) 8.6-50 MG tablet Take 2 tablets by mouth daily. 10/20/14   [name], MD    Family History Family History  Problem Relation Age of Onset  . Hypertension Mother   . Diabetes Mother   . Heart disease Mother   . Heart disease Father   . Heart attack Father   . Hypertension Father   . Diabetes Mellitus II Father     Social History Social History  Substance Use Topics  . Smoking status: Never Smoker  . Smokeless tobacco: Never Used  . Alcohol use No     Allergies   Review of patient's allergies indicates no known allergies.   Review of Systems Review of Systems  HENT: Positive for  nosebleeds.   Musculoskeletal:       Right wrist pain  Skin: Positive for wound.  All other systems reviewed and are negative.    Physical Exam Updated Vital Signs BP 158/92   Pulse 70   Temp 98.3 F (36.8 C) (Oral)   Resp 16   Ht 5\' 10"  (1.778 m)   Wt 205 lb (93 kg)   SpO2 97%   BMI 29.41 kg/m  Physical Exam  Constitutional: He is oriented to person, place, and time. He appears well-developed and well-nourished.  HENT:  Head: Normocephalic.    Right Ear: External ear normal.  Left Ear: External ear normal.  Mouth/Throat: Oropharynx is clear and moist.  Eyes: Conjunctivae and EOM are normal. Pupils are equal, round, and reactive to light.  Neck: Normal range of motion. Neck supple.  Cardiovascular: Normal rate, regular rhythm, normal heart sounds and intact distal pulses.   Pulmonary/Chest: Effort normal and breath sounds normal.  Abdominal: Soft. Bowel sounds are normal.  Musculoskeletal:       Left wrist: He exhibits bony tenderness. He exhibits normal range of motion.       Right knee: He exhibits normal range of motion.       Legs: Neurological: He is alert and oriented to person, place, and time.  Skin: Skin is warm and dry.  Psychiatric: He has a normal mood and affect. His behavior is normal. Judgment and thought content normal.  Nursing note and vitals reviewed.    ED Treatments / Results  Labs (all labs ordered are listed, but only abnormal results are displayed) Labs Reviewed  CBC WITH DIFFERENTIAL/PLATELET - Abnormal; Notable for the following:       Result Value   WBC 11.2 (*)    RDW 16.5 (*)    All other components within normal limits  PROTIME-INR    EKG  EKG Interpretation None       Radiology Dg Wrist Complete Right  Result Date: 12/23/2015 CLINICAL DATA:  Pain following fall EXAM: RIGHT WRIST - COMPLETE 3+ VIEW COMPARISON:  None. FINDINGS: Frontal, oblique, lateral, and ulnar deviation scaphoid images were obtained. There is no  demonstrable fracture or dislocation. There are scattered foci of intra-articular calcification. There is mild narrowing of the proximal carpal row. No erosive change peer IMPRESSION: No acute fracture or dislocation. Osteoarthritic change proximally. Chondrocalcinosis, likely due to osteoarthritic change, although this finding may be seen with calcium pyrophosphate deposition disease. Electronically Signed   By: Lowella Grip III M.D.   On: 12/23/2015 11:11  Ct Head Wo Contrast  Result Date: 12/23/2015 CLINICAL DATA:  Pain following fall EXAM: CT HEAD WITHOUT CONTRAST CT MAXILLOFACIAL WITHOUT CONTRAST TECHNIQUE: Multidetector CT imaging of the head and maxillofacial structures were performed using the standard protocol without intravenous contrast. Multiplanar CT image reconstructions of the maxillofacial structures were also generated. COMPARISON:  Head CT Oct 19, 2014 FINDINGS: CT HEAD FINDINGS Mild diffuse atrophy is stable. There is no intracranial mass, hemorrhage, extra-axial fluid collection, or midline shift. There is minimal periventricular small vessel disease in the centra semiovale bilaterally. Elsewhere gray-white compartments appear normal. No acute infarct is evident. There are no hyper dense vessels. There is calcification in the cavernous carotid artery regions bilaterally. The bony calvarium appears intact. The visualized mastoid air cells are clear. CT MAXILLOFACIAL FINDINGS There is no acute fracture or dislocation. There is a bony defect in the left superior alveolar ridge which extends to the inferior left maxillary antrum, likely due to tooth loss in this area. This finding does not appear acute. There is localized erosion along the medial left mandible, likely due to chronic dental disease. There is mild soft tissue swelling over the nose and upper face in the midline. No intraorbital lesions are identified. Orbits appear symmetric bilaterally. There is a retention cyst in the  inferior right maxillary antrum. There is mucosal thickening along the inferior right maxillary antrum. There is mild mucosal thickening  in several ethmoid air cells bilaterally. Other paranasal sinuses are clear. No air-fluid level. There is obstruction of the ostiomeatal unit complex on the right due to mucosal edema. The ostiomeatal unit complex on the left is patent. There is rightward deviation of the anterior nasal septum. There is no nares obstruction. There is a concha bullosa on each side, an anatomic variant. Salivary glands appear symmetric bilaterally. No adenopathy. Visualized pharynx appears normal. There is pannus surrounding the upper odontoid without impression on the craniocervical junction. IMPRESSION: CT head: Atrophy with minimal periventricular small vessel disease. No intracranial mass, hemorrhage, or extra-axial fluid collection. No acute infarct evident. Mild atherosclerotic calcification in distal carotid arteries bilaterally. CT maxillofacial: No acute fracture or dislocation. Soft tissue swelling over the nose and upper face regions. Areas of bony loss due to dental disease as noted above. There are areas of paranasal sinus disease. Obstruction of the ostiomeatal unit complex on the right is noted. The ostiomeatal unit complex on the left is patent. There is rightward deviation of the nasal septum anteriorly. Electronically Signed   By: Lowella Grip III M.D.   On: 12/23/2015 13:37  Ct Maxillofacial Wo Contrast  Result Date: 12/23/2015 CLINICAL DATA:  Pain following fall EXAM: CT HEAD WITHOUT CONTRAST CT MAXILLOFACIAL WITHOUT CONTRAST TECHNIQUE: Multidetector CT imaging of the head and maxillofacial structures were performed using the standard protocol without intravenous contrast. Multiplanar CT image reconstructions of the maxillofacial structures were also generated. COMPARISON:  Head CT Oct 19, 2014 FINDINGS: CT HEAD FINDINGS Mild diffuse atrophy is stable. There is no  intracranial mass, hemorrhage, extra-axial fluid collection, or midline shift. There is minimal periventricular small vessel disease in the centra semiovale bilaterally. Elsewhere gray-white compartments appear normal. No acute infarct is evident. There are no hyper dense vessels. There is calcification in the cavernous carotid artery regions bilaterally. The bony calvarium appears intact. The visualized mastoid air cells are clear. CT MAXILLOFACIAL FINDINGS There is no acute fracture or dislocation. There is a bony defect in the left superior alveolar ridge which extends to the inferior left maxillary antrum, likely due to tooth loss in this area. This finding does not appear acute. There is localized erosion along the medial left mandible, likely due to chronic dental disease. There is mild soft tissue swelling over the nose and upper face in the midline. No intraorbital lesions are identified. Orbits appear symmetric bilaterally. There is a retention cyst in the inferior right maxillary antrum. There is mucosal thickening along the inferior right maxillary antrum. There is mild mucosal thickening in several ethmoid air cells bilaterally. Other paranasal sinuses are clear. No air-fluid level. There is obstruction of the ostiomeatal unit complex on the right due to mucosal edema. The ostiomeatal unit complex on the left is patent. There is rightward deviation of the anterior nasal septum. There is no nares obstruction. There is a concha bullosa on each side, an anatomic variant. Salivary glands appear symmetric bilaterally. No adenopathy. Visualized pharynx appears normal. There is pannus surrounding the upper odontoid without impression on the craniocervical junction. IMPRESSION: CT head: Atrophy with minimal periventricular small vessel disease. No intracranial mass, hemorrhage, or extra-axial fluid collection. No acute infarct evident. Mild atherosclerotic calcification in distal carotid arteries bilaterally. CT  maxillofacial: No acute fracture or dislocation. Soft tissue swelling over the nose and upper face regions. Areas of bony loss due to dental disease as noted above. There are areas of paranasal sinus disease. Obstruction of the ostiomeatal unit complex on the right is  noted. The ostiomeatal unit complex on the left is patent. There is rightward deviation of the nasal septum anteriorly. Electronically Signed   By: Lowella Grip III M.D.   On: 12/23/2015 13:37   Procedures .Marland KitchenLaceration Repair Date/Time: 12/23/2015 1:58 PM Performed by: Isla Pence Authorized by: Isla Pence   Consent:    Consent obtained:  Verbal   Consent given by:  Patient   Risks discussed:  Poor cosmetic result, pain and infection   Alternatives discussed:  No treatment Universal protocol:    Procedure explained and questions answered to patient or proxy's satisfaction: yes     Relevant documents present and verified: yes     Test results available and properly labeled: yes     Imaging studies available: yes     Required blood products, implants, devices, and special equipment available: yes     Site/side marked: yes     Patient identity confirmed:  Verbally with patient Anesthesia (see MAR for exact dosages):    Anesthesia method:  None Laceration details:    Location:  Face   Face location:  Nose   Length (cm):  1 Repair type:    Repair type:  Simple Skin repair:    Repair method:  Tissue adhesive   (including critical care time) The wound had scabbed and due to the trouble getting it to stop bleeding when it initially happened, I did not scrub the wound.  Dermabond applied to prevent it from bleeding again.  Medications Ordered in ED Medications  Tdap (BOOSTRIX) injection 0.5 mL (0.5 mLs Intramuscular Given 12/23/15 1302)     Initial Impression / Assessment and Plan / ED Course  I have reviewed the triage vital signs and the nursing notes.  Pertinent labs & imaging results that were  available during my care of the patient were reviewed by me and considered in my medical decision making (see chart for details).  Clinical Course   Pt has done well.  He did not want any pain meds here or for home.  He knows to f/u with his pcp and with his orthopedist.  Return if worse.  Final Clinical Impressions(s) / ED Diagnoses   Final diagnoses:  Fall  Fall, initial encounter  Facial contusion, initial encounter  Minor head injury, initial encounter  Contusion  Wrist sprain, right, initial encounter    New Prescriptions New Prescriptions   No medications on file     Isla Pence, MD 12/23/15 1400

## 2015-12-23 NOTE — ED Notes (Signed)
Pt transported to radiology.

## 2015-12-23 NOTE — ED Notes (Signed)
Pt is in stable condition upon d/c and is escorted from ED via wheelchair. 

## 2015-12-23 NOTE — ED Triage Notes (Signed)
Pt reports to the ED following fall. Pt reports he stumbled and fell this am and struck his face, right wrist, and right knee on a stone walkway. He is on blood thinners Alen Blew). Reports losing a significant amount of blood. Denies any LOC. Pt was referred here from the [facility] for head CT. Pt A&Ox4, resp e/u, and skin warm and dry.

## 2015-12-31 ENCOUNTER — Ambulatory Visit (INDEPENDENT_AMBULATORY_CARE_PROVIDER_SITE_OTHER): Payer: Medicare Other | Admitting: Pulmonary Disease

## 2015-12-31 ENCOUNTER — Encounter: Payer: Self-pay | Admitting: Pulmonary Disease

## 2015-12-31 ENCOUNTER — Ambulatory Visit (INDEPENDENT_AMBULATORY_CARE_PROVIDER_SITE_OTHER)
Admission: RE | Admit: 2015-12-31 | Discharge: 2015-12-31 | Disposition: A | Discharge status: Home / Self Care | Payer: Medicare Other | Source: Ambulatory Visit | Attending: Pulmonary Disease | Admitting: Pulmonary Disease

## 2015-12-31 VITALS — BP 124/74 | HR 78 | Ht 70.0 in | Wt 207.0 lb

## 2015-12-31 DIAGNOSIS — R05 Cough: Secondary | ICD-10-CM

## 2015-12-31 DIAGNOSIS — R059 Cough, unspecified: Secondary | ICD-10-CM

## 2015-12-31 NOTE — Patient Instructions (Addendum)
1.  Continue to take your protonix daily for reflux 2.  Follow up with Dr. Paulita Fujita for GI evaluation 3.  We will assess your chest xray today and call you with the results.  4.  Please call if to be seen if you do not have relief of your cough with the ongoing protonix use.   5.  Eat small frequent meals, things that can cause reflux include tomato based foods, chocolate, spicy foods, large meals, lying down after a meal and alcohol.   6.  Call for new or worsening symptoms 7.  Continue to wear your oxygen at 2L at night

## 2015-12-31 NOTE — Progress Notes (Signed)
[facility]   Chief Complaint  Patient presents with  . Acute Visit    Pt of Dr Agustina Caroli last seen May 2016.  Pt c/o increased cough over the past few months- non prod and he has noticed being cold can trigger cough. He states cough has actually seemed better over the past few days.      Current Outpatient Prescriptions on File Prior to Visit  Medication Sig  . allopurinol (ZYLOPRIM) 300 MG tablet Take 300 mg by mouth every morning.   . insulin glargine (LANTUS) 100 UNIT/ML injection Inject 0.2 mLs (20 Units total) into the skin at bedtime.  . metoprolol tartrate (LOPRESSOR) 25 MG tablet Take 1 tablet (25 mg total) by mouth every 8 (eight) hours.  . Multiple Vitamins-Minerals (CENTRUM SILVER ULTRA MENS PO) Take 1 tablet by mouth daily.  Marland Kitchen omega-3 acid ethyl esters (LOVAZA) 1 G capsule Take 1 g by mouth every morning.  . pantoprazole (PROTONIX) 40 MG tablet Take 40 mg by mouth every evening.   . rivaroxaban (XARELTO) 20 MG TABS tablet Take 1 tablet (20 mg total) by mouth every morning.  . rosuvastatin (CRESTOR) 10 MG tablet Take 10 mg by mouth every evening.    No current facility-administered medications on file prior to visit.      Studies: PSG (approx 2013) >> no OSA but showed nocturnal hypoxemia  CXR 07/2014 >> normal  PFT 08/21/14 >> normal spirometry with normal flow volume curve, normal TLC with a decreased RV consistent with possible restriction, and inability to correctly perform the DLCO (note low VA)  Past Medical Hx:  has a past medical history of Bilateral hearing loss; Chronic renal insufficiency, stage III (moderate); Diabetes mellitus type 2 in obese ([place]); Diabetic neuropathy ([place]); Dysrhythmia; GERD (gastroesophageal reflux disease); Gout; Hypertension; Ileus, postoperative (10/22/2014); Left knee DJD; On supplemental oxygen therapy; PONV (postoperative nausea and vomiting); and Right knee DJD.   Past Surgical hx, Allergies, Family hx, Social hx all  reviewed.  Vital Signs BP 124/74 (BP Location: Left Arm, Cuff Size: Normal)   Pulse 78   Ht 5\' 10"  (1.778 m)   Wt 207 lb (93.9 kg)   SpO2 96%   BMI 29.70 kg/m   History of Present Illness Howard Charles is a 80 y.o. male, never smoker, with a history of HTN, DM, GERD, chronic renal insufficiency, AF (on rivaroxaban), cough (improved on PPI), dyspnea (cxr clear, reassuring cards eval 07/2014, thought related to weight gain > 224 lbs in 09/2014), allergic rhinitis and 2L nocturnal hypoxemia (followed by Dr. Lamonte Sakai) who presented to the pulmonary office 8/2 with complaints of cough.    Of note, he was seen in the ER on 12/23/15 after a mechanical fall.  Pt reports for several years in late winter in early spring he would develop a cough but no other symptoms - specifically denies a cold.  He reports in the last 6 months, he has had a cough.  He was taken off his PPI in January 2017 after a hospitalization for fractured hip and ileus / SBO.  Notices when he is exposed to cool air the cough is worse.  Cough has improved in the last few days after restarting on protonix.    Pt reports he is not taking PPI BID after a hospitalization in Jan 2017.  Has been having episodes of burning with food, has to get up from dinner with bile/brown gagging but not vomiting and would "spit up" with relief.  Also has had  coinciding hip pain and has been on prednisone for his hip.  Has had periods where he will eat and feels as if his food does not go down.  Weight down to 204 from 224 (lost during hospitalization).   No sinus drainage, no sinus pressure, no sputum production with cough.  He has an appointment with Dr. Paulita Fujita with GI.  Describes burning is in mid chest, "not bad but an irritant".  Notices it more with meals.  Denies SOB.  Pt not wearing his oxygen at night as he has felt he didn't need it.   Physical Exam  General - well developed adult M in no acute distress ENT - No sinus tenderness, no oral  exudate, no LAN Cardiac - s1s2 regular, pulses indicate sinus rhythm, no murmur Chest - even/non-labored, lungs bilaterally clear. No wheeze/rales Back - No focal tenderness Abd - Soft, non-tender Ext - No edema Neuro - Normal strength Skin - No rashes Psych - normal mood, and behavior   Assessment/Plan  Cough - suspect is related to acid reflux.  Cessation of PPI in Jan 2017, addition of intermittent prednisone for hip related pain. Prior PFT's showed no obstruction, degree of restriction.  CXR was clear in 2016.  No other pulmonary related symptoms.    Plan: Assess CXR to ensure no mass or pulmonary contribution for cough Discussed acid reflux reduction strategies  Will call patient with results of CXR  Continue PPI use daily  Consider more cardioselective beta blocker (metoprolol to bisoprolol??)  Follow up with Dr. Lamonte Sakai in 3 months for review or sooner if new needs arise   Nocturnal Hypoxemia   Plan: Encouraged patient to continue to use oxygen at night    Patient Instructions  1.  Continue to take your protonix daily for reflux 2.  Follow up with Dr. Paulita Fujita for GI evaluation 3.  We will assess your chest xray today and call you with the results.  4.  Please call if to be seen if you do not have relief of your cough with the ongoing protonix use.   5.  Eat small frequent meals, things that can cause reflux include tomato based foods, chocolate, spicy foods, large meals, lying down after a meal and alcohol.   6.  Call for new or worsening symptoms 7.  Continue to wear your oxygen at 2L at night      Noe Gens, NP-C [facility]  [phone] 12/31/2015, 10:22 AM

## 2016-01-02 ENCOUNTER — Telehealth: Payer: Self-pay | Admitting: Pulmonary Disease

## 2016-01-02 NOTE — Telephone Encounter (Signed)
Sharyn Lull,   Will you please call Mr. Virgilio to let him know that his chest xray does not show any acute process that would cause cough.  Let him to know to please call if his cough does not resolve with ongoing use of his protonix.    Thank you,   Noe Gens, NP-C [facility] Pgr: [phone] or if no answer [phone] 01/02/2016, 2:51 PM

## 2016-01-05 NOTE — Progress Notes (Signed)
Pt was already aware per 8.4.17 phone note Reiterated results / recs as stated by BO Pt voiced his understanding and denied any questions/concerns at this time

## 2016-01-05 NOTE — Telephone Encounter (Signed)
Patient aware of results from CXR and Brandi's recommendations. Nothing further needed.

## 2016-01-09 ENCOUNTER — Other Ambulatory Visit: Payer: Self-pay | Admitting: Gastroenterology

## 2016-01-09 DIAGNOSIS — R1310 Dysphagia, unspecified: Secondary | ICD-10-CM

## 2016-01-09 DIAGNOSIS — K219 Gastro-esophageal reflux disease without esophagitis: Secondary | ICD-10-CM

## 2016-01-12 ENCOUNTER — Other Ambulatory Visit: Payer: Medicare Other

## 2016-01-16 ENCOUNTER — Ambulatory Visit
Admission: RE | Admit: 2016-01-16 | Discharge: 2016-01-16 | Disposition: A | Discharge status: Home / Self Care | Payer: Medicare Other | Source: Ambulatory Visit | Attending: Gastroenterology | Admitting: Gastroenterology

## 2016-01-16 DIAGNOSIS — K219 Gastro-esophageal reflux disease without esophagitis: Secondary | ICD-10-CM

## 2016-01-16 DIAGNOSIS — R1310 Dysphagia, unspecified: Secondary | ICD-10-CM

## 2016-02-11 ENCOUNTER — Other Ambulatory Visit: Payer: Self-pay | Admitting: Orthopedic Surgery

## 2016-02-23 ENCOUNTER — Other Ambulatory Visit: Payer: Self-pay | Admitting: Orthopedic Surgery

## 2016-03-11 ENCOUNTER — Other Ambulatory Visit (HOSPITAL_COMMUNITY): Payer: Medicare Other

## 2016-03-11 ENCOUNTER — Ambulatory Visit: Payer: Self-pay | Admitting: Orthopedic Surgery

## 2016-03-23 ENCOUNTER — Inpatient Hospital Stay: Admit: 2016-03-23 | Payer: Medicare Other | Admitting: Orthopedic Surgery

## 2016-03-23 SURGERY — ARTHROPLASTY, HIP, TOTAL,POSTERIOR APPROACH
Anesthesia: General | Laterality: Left

## 2016-04-05 ENCOUNTER — Ambulatory Visit: Payer: Medicare Other | Admitting: Emergency Medicine

## 2016-04-07 ENCOUNTER — Ambulatory Visit (INDEPENDENT_AMBULATORY_CARE_PROVIDER_SITE_OTHER): Payer: Medicare Other | Admitting: Emergency Medicine

## 2016-04-07 ENCOUNTER — Encounter: Payer: Self-pay | Admitting: Emergency Medicine

## 2016-04-07 DIAGNOSIS — R05 Cough: Secondary | ICD-10-CM | POA: Diagnosis not present

## 2016-04-07 DIAGNOSIS — R059 Cough, unspecified: Secondary | ICD-10-CM

## 2016-04-07 NOTE — Patient Instructions (Addendum)
Please continue your pantoprazole 40mg  every evening.  Follow with Dr Lamonte Sakai as needed if you develop new symptoms.

## 2016-04-07 NOTE — Assessment & Plan Note (Signed)
Responded nicely to reinitiation of pantoprazole. He is using once a day instead of twice and it is controlling his reflux and his cough.

## 2016-04-07 NOTE — Progress Notes (Signed)
Subjective:    Patient ID: Howard Charles, male    DOB: 23-Jan-1936, 80 y.o.   MRN: [id number]  HPI 80 year old never smoker with a history of hypertension, diabetes, GERD, chronic renal insufficiency, Atrial Fib. He has been evaluated by Dr Shelia Media for cough. Underwent pulmonary function testing 08/21/14. This is reviewed by me and shows normal spirometry with normal flow volume curve, a normal TLC with a decreased RV consistent with possible restriction, and inability to correctly perform the DLCO (note low Va). Beginning in March has had a sensation when he lays down that he is dyspneic, cannot get a deep breath, a discomfort  He saw Dr Nadyne Coombes and was cleared. The discomfort seemed to get better when his PPI was increased. He put on about 20 lbs over 20 yrs. He is able to go to the gym, ride recumbent bike.   Also developed some cough prior to this time period - Dr Shelia Media increased his pantoprazole to bid and it helped the cough some. He is now back to once a day. The cough increasede slightly when he decreased the med.   He had a PSG 3-4 yrs ago, did not show OSA but did show nocturnal hypoxemia.   CXR 07/2014 was normal  ROV 04/07/16 -- This follow-up visit for chronic cough. He had pulmonary function testing as outlined above. He was seen in our office in August for a flare of his coughing. His PPI was increased / restarted and gastroenterology was sought. Chest x-ray done 12/31/15 did not show any abnormality. He had an esophagram on 01/16/16 that showed nonspecific esophageal dysmotility with prominent tertiary contractions and presbyesophagus. He reports that his cough is resolved. He is breathing well.    Review of Systems  Constitutional: Negative for fever and unexpected weight change.  HENT: Negative for congestion, dental problem, ear pain, nosebleeds, postnasal drip, rhinorrhea, sinus pressure, sneezing, sore throat and trouble swallowing.   Eyes: Negative for redness and itching.    Respiratory: Negative for cough, chest tightness, shortness of breath and wheezing.   Cardiovascular: Negative for palpitations and leg swelling.  Gastrointestinal: Negative for nausea and vomiting.  Genitourinary: Negative for dysuria.  Musculoskeletal: Negative for joint swelling.  Skin: Negative for rash.  Neurological: Negative for headaches.  Hematological: Does not bruise/bleed easily.  Psychiatric/Behavioral: Negative for dysphoric mood. The patient is not nervous/anxious.    Past Medical History:  Diagnosis Date  . Bilateral hearing loss    WEARS HEARING AIDS........RIGHT IS BETTER THEN LEFT  . Chronic renal insufficiency, stage III (moderate)    Creatinine 1.47  . Diabetes mellitus type 2 in obese ([place])   . Diabetic neuropathy ([place])   . Dysrhythmia    h/o A Fib  . GERD (gastroesophageal reflux disease)   . Gout   . Hypertension   . Ileus, postoperative ([place]) 10/22/2014  . Left knee DJD   . On supplemental oxygen therapy    at night  . PONV (postoperative nausea and vomiting)    H/O ....BUT LAST FEW TIMES---NONE  . Right knee DJD      Family History  Problem Relation Age of Onset  . Hypertension Mother   . Diabetes Mother   . Heart disease Mother   . Heart disease Father   . Heart attack Father   . Hypertension Father   . Diabetes Mellitus II Father      Social History   Social History  . Marital status: Married    Spouse name: N/A  .  Number of children: N/A  . Years of education: N/A   Occupational History  . Not on file.   Social History Main Topics  . Smoking status: Never Smoker  . Smokeless tobacco: Never Used  . Alcohol use No  . Drug use: No  . Sexual activity: Not on file   Other Topics Concern  . Not on file   Social History Narrative  . No narrative on file     No Known Allergies   Outpatient Medications Prior to Visit  Medication Sig Dispense Refill  . allopurinol (ZYLOPRIM) 300 MG tablet Take 300 mg by mouth every morning.      . cholecalciferol (VITAMIN D) 1000 units tablet Take 1,000 Units by mouth daily.    . flecainide (TAMBOCOR) 100 MG tablet Take 100 mg by mouth 2 (two) times daily.    . insulin glargine (LANTUS) 100 UNIT/ML injection Inject 0.2 mLs (20 Units total) into the skin at bedtime. 10 mL 11  . metoprolol tartrate (LOPRESSOR) 25 MG tablet Take 1 tablet (25 mg total) by mouth every 8 (eight) hours. 90 tablet 0  . Multiple Vitamins-Minerals (CENTRUM SILVER ULTRA MENS PO) Take 1 tablet by mouth daily.    Marland Kitchen omega-3 acid ethyl esters (LOVAZA) 1 G capsule Take 1 g by mouth every morning.    . pantoprazole (PROTONIX) 40 MG tablet Take 40 mg by mouth every evening.     . rosuvastatin (CRESTOR) 10 MG tablet Take 10 mg by mouth every evening.     Marland Kitchen telmisartan (MICARDIS) 20 MG tablet Take 20 mg by mouth daily.    . rivaroxaban (XARELTO) 20 MG TABS tablet Take 1 tablet (20 mg total) by mouth every morning. 30 tablet 0   No facility-administered medications prior to visit.          Objective:   Physical Exam Vitals:   04/07/16 1328  BP: 102/72  Pulse: 72  SpO2: 97%  Weight: 194 lb (88 kg)  Height: 5\' 10"  (1.778 m)   Gen: Pleasant, well-nourished, in no distress,  normal affect  ENT: No lesions,  mouth clear,  oropharynx clear, no postnasal drip  Neck: No JVD, no TMG, no carotid bruits  Lungs: No use of accessory muscles, no dullness to percussion, clear without rales or rhonchi  Cardiovascular: RRR, heart sounds normal, no murmur or gallops, no peripheral edema  Musculoskeletal: No deformities, no cyanosis or clubbing  Neuro: alert, non focal  Skin: Warm, no lesions or rashes      Assessment & Plan:  Cough Responded nicely to reinitiation of pantoprazole. He is using once a day instead of twice and it is controlling his reflux and his cough.  Baltazar Apo, MD, PhD 04/07/2016, 1:45 PM [facility] [phone] or if no answer [phone]

## 2016-04-14 ENCOUNTER — Other Ambulatory Visit: Payer: Self-pay | Admitting: Gastroenterology

## 2016-04-14 DIAGNOSIS — R1011 Right upper quadrant pain: Secondary | ICD-10-CM

## 2016-04-14 DIAGNOSIS — R7989 Other specified abnormal findings of blood chemistry: Secondary | ICD-10-CM

## 2016-04-14 DIAGNOSIS — R945 Abnormal results of liver function studies: Secondary | ICD-10-CM

## 2016-04-15 ENCOUNTER — Ambulatory Visit
Admission: RE | Admit: 2016-04-15 | Discharge: 2016-04-15 | Disposition: A | Discharge status: Home / Self Care | Payer: Medicare Other | Source: Ambulatory Visit | Attending: Gastroenterology | Admitting: Gastroenterology

## 2016-04-15 DIAGNOSIS — R1011 Right upper quadrant pain: Secondary | ICD-10-CM

## 2016-04-15 DIAGNOSIS — R945 Abnormal results of liver function studies: Secondary | ICD-10-CM

## 2016-04-15 DIAGNOSIS — R7989 Other specified abnormal findings of blood chemistry: Secondary | ICD-10-CM

## 2016-04-16 ENCOUNTER — Other Ambulatory Visit: Payer: Self-pay | Admitting: Gastroenterology

## 2016-04-16 DIAGNOSIS — R945 Abnormal results of liver function studies: Secondary | ICD-10-CM

## 2016-04-16 DIAGNOSIS — R1011 Right upper quadrant pain: Secondary | ICD-10-CM

## 2016-04-16 DIAGNOSIS — R7989 Other specified abnormal findings of blood chemistry: Secondary | ICD-10-CM

## 2016-04-26 ENCOUNTER — Ambulatory Visit
Admission: RE | Admit: 2016-04-26 | Discharge: 2016-04-26 | Disposition: A | Discharge status: Home / Self Care | Payer: Medicare Other | Source: Ambulatory Visit | Attending: Gastroenterology | Admitting: Gastroenterology

## 2016-04-26 DIAGNOSIS — R7989 Other specified abnormal findings of blood chemistry: Secondary | ICD-10-CM

## 2016-04-26 DIAGNOSIS — R1011 Right upper quadrant pain: Secondary | ICD-10-CM

## 2016-04-26 DIAGNOSIS — R945 Abnormal results of liver function studies: Secondary | ICD-10-CM

## 2016-04-27 ENCOUNTER — Other Ambulatory Visit (HOSPITAL_COMMUNITY): Payer: Self-pay | Admitting: Gastroenterology

## 2016-04-27 DIAGNOSIS — C787 Secondary malignant neoplasm of liver and intrahepatic bile duct: Secondary | ICD-10-CM

## 2016-04-27 DIAGNOSIS — K8689 Other specified diseases of pancreas: Secondary | ICD-10-CM

## 2016-05-03 ENCOUNTER — Other Ambulatory Visit: Payer: Self-pay | Admitting: General Surgery

## 2016-05-04 ENCOUNTER — Encounter (HOSPITAL_COMMUNITY): Payer: Self-pay

## 2016-05-04 ENCOUNTER — Ambulatory Visit (HOSPITAL_COMMUNITY)
Admission: RE | Admit: 2016-05-04 | Discharge: 2016-05-04 | Disposition: A | Discharge status: Home / Self Care | Payer: Medicare Other | Source: Ambulatory Visit | Attending: Gastroenterology | Admitting: Gastroenterology

## 2016-05-04 DIAGNOSIS — E1140 Type 2 diabetes mellitus with diabetic neuropathy, unspecified: Secondary | ICD-10-CM | POA: Insufficient documentation

## 2016-05-04 DIAGNOSIS — N183 Chronic kidney disease, stage 3 (moderate): Secondary | ICD-10-CM | POA: Insufficient documentation

## 2016-05-04 DIAGNOSIS — R7989 Other specified abnormal findings of blood chemistry: Secondary | ICD-10-CM | POA: Insufficient documentation

## 2016-05-04 DIAGNOSIS — Z9049 Acquired absence of other specified parts of digestive tract: Secondary | ICD-10-CM | POA: Insufficient documentation

## 2016-05-04 DIAGNOSIS — R160 Hepatomegaly, not elsewhere classified: Secondary | ICD-10-CM | POA: Insufficient documentation

## 2016-05-04 DIAGNOSIS — Z9889 Other specified postprocedural states: Secondary | ICD-10-CM | POA: Diagnosis not present

## 2016-05-04 DIAGNOSIS — N281 Cyst of kidney, acquired: Secondary | ICD-10-CM | POA: Insufficient documentation

## 2016-05-04 DIAGNOSIS — M109 Gout, unspecified: Secondary | ICD-10-CM | POA: Diagnosis not present

## 2016-05-04 DIAGNOSIS — Z794 Long term (current) use of insulin: Secondary | ICD-10-CM | POA: Diagnosis not present

## 2016-05-04 DIAGNOSIS — M5136 Other intervertebral disc degeneration, lumbar region: Secondary | ICD-10-CM | POA: Insufficient documentation

## 2016-05-04 DIAGNOSIS — R05 Cough: Secondary | ICD-10-CM | POA: Insufficient documentation

## 2016-05-04 DIAGNOSIS — M160 Bilateral primary osteoarthritis of hip: Secondary | ICD-10-CM | POA: Diagnosis not present

## 2016-05-04 DIAGNOSIS — Z7901 Long term (current) use of anticoagulants: Secondary | ICD-10-CM | POA: Insufficient documentation

## 2016-05-04 DIAGNOSIS — K869 Disease of pancreas, unspecified: Secondary | ICD-10-CM | POA: Insufficient documentation

## 2016-05-04 DIAGNOSIS — R109 Unspecified abdominal pain: Secondary | ICD-10-CM | POA: Diagnosis not present

## 2016-05-04 DIAGNOSIS — C787 Secondary malignant neoplasm of liver and intrahepatic bile duct: Secondary | ICD-10-CM

## 2016-05-04 DIAGNOSIS — I700 Atherosclerosis of aorta: Secondary | ICD-10-CM | POA: Diagnosis not present

## 2016-05-04 DIAGNOSIS — K8689 Other specified diseases of pancreas: Secondary | ICD-10-CM

## 2016-05-04 DIAGNOSIS — K219 Gastro-esophageal reflux disease without esophagitis: Secondary | ICD-10-CM | POA: Diagnosis not present

## 2016-05-04 DIAGNOSIS — K449 Diaphragmatic hernia without obstruction or gangrene: Secondary | ICD-10-CM | POA: Diagnosis not present

## 2016-05-04 DIAGNOSIS — R599 Enlarged lymph nodes, unspecified: Secondary | ICD-10-CM | POA: Insufficient documentation

## 2016-05-04 DIAGNOSIS — E669 Obesity, unspecified: Secondary | ICD-10-CM | POA: Diagnosis not present

## 2016-05-04 DIAGNOSIS — Z9079 Acquired absence of other genital organ(s): Secondary | ICD-10-CM | POA: Insufficient documentation

## 2016-05-04 DIAGNOSIS — I129 Hypertensive chronic kidney disease with stage 1 through stage 4 chronic kidney disease, or unspecified chronic kidney disease: Secondary | ICD-10-CM | POA: Insufficient documentation

## 2016-05-04 DIAGNOSIS — E1122 Type 2 diabetes mellitus with diabetic chronic kidney disease: Secondary | ICD-10-CM | POA: Insufficient documentation

## 2016-05-04 DIAGNOSIS — C259 Malignant neoplasm of pancreas, unspecified: Secondary | ICD-10-CM | POA: Insufficient documentation

## 2016-05-04 LAB — CBC
HCT: 41.2 % (ref 39.0–52.0)
Hemoglobin: 13.8 g/dL (ref 13.0–17.0)
MCH: 29.7 pg (ref 26.0–34.0)
MCHC: 33.5 g/dL (ref 30.0–36.0)
MCV: 88.6 fL (ref 78.0–100.0)
PLATELETS: 349 10*3/uL (ref 150–400)
RBC: 4.65 MIL/uL (ref 4.22–5.81)
RDW: 15.2 % (ref 11.5–15.5)
WBC: 15.5 10*3/uL — ABNORMAL HIGH (ref 4.0–10.5)

## 2016-05-04 LAB — PROTIME-INR
INR: 1.11
Prothrombin Time: 14.3 seconds (ref 11.4–15.2)

## 2016-05-04 LAB — GLUCOSE, CAPILLARY: Glucose-Capillary: 174 mg/dL — ABNORMAL HIGH (ref 65–99)

## 2016-05-04 LAB — APTT: aPTT: 29 seconds (ref 24–36)

## 2016-05-04 MED ORDER — SODIUM CHLORIDE 0.9 % IV SOLN
INTRAVENOUS | Status: DC
  Administered 2016-05-04: 11:00:00 via INTRAVENOUS

## 2016-05-04 MED ORDER — FENTANYL CITRATE (PF) 100 MCG/2ML IJ SOLN
INTRAMUSCULAR | Status: AC
  Filled 2016-05-04: qty 4

## 2016-05-04 MED ORDER — FENTANYL CITRATE (PF) 100 MCG/2ML IJ SOLN
INTRAMUSCULAR | Status: AC | PRN
  Administered 2016-05-04: 50 ug via INTRAVENOUS

## 2016-05-04 MED ORDER — MIDAZOLAM HCL 2 MG/2ML IJ SOLN
INTRAMUSCULAR | Status: AC | PRN
  Administered 2016-05-04 (×3): 1 mg via INTRAVENOUS

## 2016-05-04 MED ORDER — MIDAZOLAM HCL 2 MG/2ML IJ SOLN
INTRAMUSCULAR | Status: AC
  Filled 2016-05-04: qty 6

## 2016-05-04 NOTE — Discharge Instructions (Signed)
°Liver Biopsy, Care After °Introduction °Refer to this sheet in the next few weeks. These instructions provide you with information on caring for yourself after your procedure. Your health care provider may also give you more specific instructions. Your treatment has been planned according to current medical practices, but problems sometimes occur. Call your health care provider if you have any problems or questions after your procedure. °What can I expect after the procedure? °After your procedure, it is typical to have the following: °· A small amount of discomfort in the area where the biopsy was done and in the right shoulder or shoulder blade. °· A small amount of bruising around the area where the biopsy was done and on the skin over the liver. °· Sleepiness and fatigue for the rest of the day. °Follow these instructions at home: °· Rest at home for 1-2 days or as directed by your health care provider. °· Have a friend or family member stay with you for at least 24 hours. °· Because of the medicines used during the procedure, you should not do the following things in the first 24 hours: °¨ Drive. °¨ Use machinery. °¨ Be responsible for the care of other people. °¨ Sign legal documents. °¨ Take a bath or shower. °· There are many different ways to close and cover an incision, including stitches, skin glue, and adhesive strips. Follow your health care provider's instructions on: °¨ Incision care. °¨ Bandage (dressing) changes and removal. °¨ Incision closure removal. °· Do not drink alcohol in the first week. °· Do not lift more than 5 pounds or play contact sports for 2 weeks after this test. °· Take medicines only as directed by your health care provider. Do not take medicine containing aspirin or non-steroidal anti-inflammatory medicines such as ibuprofen for 1 week after this test. °· It is your responsibility to get your test results. °Contact a health care provider if: °· You have increased bleeding from  an incision that results in more than a small spot of blood. °· You have redness, swelling, or increasing pain in any incisions. °· You notice a discharge or a bad smell coming from any of your incisions. °· You have a fever or chills. °Get help right away if: °· You develop swelling, bloating, or pain in your abdomen. °· You become dizzy or faint. °· You develop a rash. °· You are nauseous or vomit. °· You have difficulty breathing, feel short of breath, or feel faint. °· You develop chest pain. °· You have problems with your speech or vision. °· You have trouble balancing or moving your arms or legs. °This information is not intended to replace advice given to you by your health care provider. Make sure you discuss any questions you have with your health care provider. °Document Released: 12/04/2004 Document Revised: 10/23/2015 Document Reviewed: 07/13/2013 °© 2017 Elsevier °Moderate Conscious Sedation, Adult, Care After °These instructions provide you with information about caring for yourself after your procedure. Your health care provider may also give you more specific instructions. Your treatment has been planned according to current medical practices, but problems sometimes occur. Call your health care provider if you have any problems or questions after your procedure. °What can I expect after the procedure? °After your procedure, it is common: °· To feel sleepy for several hours. °· To feel clumsy and have poor balance for several hours. °· To have poor judgment for several hours. °· To vomit if you eat too soon. °Follow these instructions   at home: °For at least 24 hours after the procedure:  °· Do not: °¨ Participate in activities where you could fall or become injured. °¨ Drive. °¨ Use heavy machinery. °¨ Drink alcohol. °¨ Take sleeping pills or medicines that cause drowsiness. °¨ Make important decisions or sign legal documents. °¨ Take care of children on your own. °· Rest. °Eating and  drinking °· Follow the diet recommended by your health care provider. °· If you vomit: °¨ Drink water, juice, or soup when you can drink without vomiting. °¨ Make sure you have little or no nausea before eating solid foods. °General instructions °· Have a responsible adult stay with you until you are awake and alert. °· Take over-the-counter and prescription medicines only as told by your health care provider. °· If you smoke, do not smoke without supervision. °· Keep all follow-up visits as told by your health care provider. This is important. °Contact a health care provider if: °· You keep feeling nauseous or you keep vomiting. °· You feel light-headed. °· You develop a rash. °· You have a fever. °Get help right away if: °· You have trouble breathing. °This information is not intended to replace advice given to you by your health care provider. Make sure you discuss any questions you have with your health care provider. °Document Released: 03/07/2013 Document Revised: 10/20/2015 Document Reviewed: 09/06/2015 °Elsevier Interactive Patient Education © 2017 Elsevier Inc. ° °

## 2016-05-04 NOTE — Procedures (Signed)
Interventional Radiology Procedure Note  Procedure: US guided biopsy of liver mass.   Complications: None Recommendations:  - Ok to shower tomorrow - Do not submerge for 7 days - Routine wound care   Signed,  Dulcy Fanny. Earleen Newport, DO

## 2016-05-04 NOTE — H&P (Signed)
Chief Complaint: Patient was seen in consultation today for liver lesion biopsy  Referring Physician(s): Dr. Arta Silence  Supervising Physician: Corrie Mckusick  Patient Status: [facility] - Out-pt  History of Present Illness: Howard Charles is a 80 y.o. male with history of GERD, DM2 with neuropathy, Stage 3 chronic renal insufficiency, and chronic cough who presents with 1 month history of abdominal pain and elevated liver function tests.  Evaluation and imaging shows pancreatic mass and suspected liver mets.  CT Abd/Pelvis (04/26/2016) IMPRESSION: 1. Findings most consistent with metastatic pancreatic carcinoma. Mass in the uncinate process of the pancreas with regional adenopathy and multiple liver metastasis. 2.  Tiny hiatal hernia. 3.  Aortic atherosclerosis. 4. Prostatectomy, without locally recurrent or pelvic nodal metastasis.  Patient referred to IR for image-guided procedure at the request of Dr. Paulita Fujita.  Dr. Kathlene Cote feels patient appropriate for procedure.  Patient is on Xarelto which he had held for 2 days.  He is NPO.   Past Medical History:  Diagnosis Date  . Bilateral hearing loss    WEARS HEARING AIDS........RIGHT IS BETTER THEN LEFT  . Chronic renal insufficiency, stage III (moderate)    Creatinine 1.47  . Diabetes mellitus type 2 in obese ([place])   . Diabetic neuropathy ([place])   . Dysrhythmia    h/o A Fib  . GERD (gastroesophageal reflux disease)   . Gout   . Hypertension   . Ileus, postoperative ([place]) 10/22/2014  . Left knee DJD   . On supplemental oxygen therapy    at night  . PONV (postoperative nausea and vomiting)    H/O ....BUT LAST FEW TIMES---NONE  . Right knee DJD     Past Surgical History:  Procedure Laterality Date  . BOWEL DECOMPRESSION N/A 10/24/2014   Procedure: BOWEL DECOMPRESSION;  Surgeon: Clarene Essex, MD;  Location: [place];  Service: Endoscopy;  Laterality: N/A;  . CHOLECYSTECTOMY    . COLONOSCOPY    . COLONOSCOPY N/A  10/24/2014   Procedure: COLONOSCOPY;  Surgeon: Clarene Essex, MD;  Location: [facility] ENDOSCOPY;  Service: Endoscopy;  Laterality: N/A;  . COLONOSCOPY N/A 11/01/2014   Procedure: COLONOSCOPY;  Surgeon: Ronald Lobo, MD;  Location: [facility] ENDOSCOPY;  Service: Endoscopy;  Laterality: N/A;  . HEMORRHOID SURGERY    . INNER EAR SURGERY    . INTRAMEDULLARY (IM) NAIL INTERTROCHANTERIC Left 10/20/2014  . INTRAMEDULLARY (IM) NAIL INTERTROCHANTERIC Left 10/20/2014   Procedure: INTRAMEDULLARY (IM) NAIL INTERTROCHANTRIC;  Surgeon: [name], MD;  Location: [place];  Service: Orthopedics;  Laterality: Left;  . KNEE ARTHROSCOPY  2008   left   . KNEE ARTHROSCOPY  2012   left  . KNEE ARTHROSCOPY  2002   right  . SHOULDER ARTHROSCOPY WITH ROTATOR CUFF REPAIR  2007  . SUPRAPUBIC PROSTATECTOMY     19 YRS  AGO  . TONSILLECTOMY    . TOTAL KNEE ARTHROPLASTY  06/26/2012   Procedure: TOTAL KNEE ARTHROPLASTY;  Surgeon: Lorn Junes, MD;  Location: [place];  Service: Orthopedics;  Laterality: Left;  . TOTAL KNEE ARTHROPLASTY Right 05/21/2013   Procedure: TOTAL KNEE ARTHROPLASTY;  Surgeon: Lorn Junes, MD;  Location: [place];  Service: Orthopedics;  Laterality: Right;    Allergies: Patient has no known allergies.  Medications: Prior to Admission medications   Medication Sig Start Date End Date Taking? Authorizing Provider  acetaminophen (TYLENOL) 500 MG tablet Take 500 mg by mouth every 6 (six) hours as needed.   Yes Historical Provider, MD  allopurinol (ZYLOPRIM) 300  MG tablet Take 300 mg by mouth every morning.    Yes Historical Provider, MD  calcium carbonate (CALCIUM 600) 600 MG TABS tablet Take 600 mg by mouth daily with breakfast.   Yes Historical Provider, MD  cholecalciferol (VITAMIN D) 1000 units tablet Take 1,000 Units by mouth daily.   Yes Historical Provider, MD  flecainide (TAMBOCOR) 100 MG tablet Take 100 mg by mouth 2 (two) times daily.   Yes Historical Provider, MD  insulin glargine (LANTUS) 100 UNIT/ML  injection Inject 0.2 mLs (20 Units total) into the skin at bedtime. 11/12/14  Yes Kelvin Cellar, MD  metoprolol tartrate (LOPRESSOR) 25 MG tablet Take 1 tablet (25 mg total) by mouth every 8 (eight) hours. 11/12/14  Yes Kelvin Cellar, MD  Multiple Vitamins-Minerals (CENTRUM SILVER ULTRA MENS PO) Take 1 tablet by mouth daily.   Yes Historical Provider, MD  omega-3 acid ethyl esters (LOVAZA) 1 G capsule Take 1 g by mouth every morning.   Yes Historical Provider, MD  pantoprazole (PROTONIX) 40 MG tablet Take 40 mg by mouth every evening.    Yes Historical Provider, MD  rosuvastatin (CRESTOR) 10 MG tablet Take 10 mg by mouth every evening.    Yes Historical Provider, MD  telmisartan (MICARDIS) 20 MG tablet Take 20 mg by mouth daily.   Yes Historical Provider, MD  Dulaglutide (TRULICITY) [id number] [id number] SOPN Inject 0.75 mg into the skin once a week.    Historical Provider, MD  Rivaroxaban (XARELTO) 15 MG TABS tablet Take 15 mg by mouth daily with supper.    Historical Provider, MD     Family History  Problem Relation Age of Onset  . Hypertension Mother   . Diabetes Mother   . Heart disease Mother   . Heart disease Father   . Heart attack Father   . Hypertension Father   . Diabetes Mellitus II Father     Social History   Social History  . Marital status: Married    Spouse name: N/A  . Number of children: N/A  . Years of education: N/A   Social History Main Topics  . Smoking status: Never Smoker  . Smokeless tobacco: Never Used  . Alcohol use No  . Drug use: No  . Sexual activity: Not Asked   Other Topics Concern  . None   Social History Narrative  . None    Review of Systems: A 12 point ROS discussed and pertinent positives are indicated in the HPI above.  All other systems are negative.  Review of Systems  Constitutional: Positive for activity change and appetite change.  Respiratory: Positive for cough (chronic). Negative for shortness of breath.   Cardiovascular:  Negative for chest pain.  Gastrointestinal: Positive for abdominal pain.  Psychiatric/Behavioral: Negative for behavioral problems and confusion.    Vital Signs: BP (!) 168/73 (BP Location: Right Arm)   Pulse 69   Temp 97.6 F (36.4 C) (Oral)   Resp 18   Ht 5\' 10"  (1.778 m)   Wt 185 lb (83.9 kg)   SpO2 98%   BMI 26.54 kg/m   Physical Exam  Constitutional: He is oriented to person, place, and time. He appears well-developed.  Cardiovascular: Normal rate, regular rhythm and normal heart sounds.   Pulmonary/Chest: Effort normal and breath sounds normal. No respiratory distress.  Abdominal: Soft. Bowel sounds are normal. There is tenderness.  Neurological: He is alert and oriented to person, place, and time.  Skin: Skin is warm and dry.  Psychiatric: He has  a normal mood and affect. His behavior is normal. Judgment and thought content normal.  Nursing note and vitals reviewed.   Mallampati Score:  MD Evaluation Airway: WNL Heart: WNL Abdomen: WNL Chest/ Lungs: WNL ASA  Classification: 3 Mallampati/Airway Score: Two  Imaging: Ct Abdomen Pelvis Wo Contrast  Result Date: 04/26/2016 CLINICAL DATA:  Right upper quadrant pain for several weeks. Elevated liver function tests. Right low back pain today. Diabetic. Hypertension. Prostatectomy for cancer. Cholecystectomy. EXAM: CT ABDOMEN AND PELVIS WITHOUT CONTRAST TECHNIQUE: Multidetector CT imaging of the abdomen and pelvis was performed following the standard protocol without IV contrast. COMPARISON:  04/15/2016 abdominal ultrasound. 11/04/2014 abdominal pelvic CT FINDINGS: Lower chest: Clear lung bases. Normal heart size without pericardial or pleural effusion. Tiny hiatal hernia. Hepatobiliary: The hepatic dome is excluded. Multiple hypo attenuating liver lesions which are new since the 2016 exam. Suboptimally evaluated on this noncontrast exam. Example in the right hepatic lobe at 1.3 cm on image 14/series 3. Cholecystectomy,  without biliary ductal dilatation. Pancreas: Fatty replacement involves the pancreatic body, neck, and head. A new mass in the pancreatic uncinate process measures 3.0 x 3.3 cm on image 26/series 2. No acute pancreatitis. Spleen: Normal in size, without focal abnormality. Adrenals/Urinary Tract: Normal adrenal glands. Mild renal cortical thinning bilaterally. A lower pole a lower pole left renal cyst. No hydronephrosis. No bladder calculi. Stomach/Bowel: Normal remainder of the stomach. Normal colon and terminal ileum. Normal small bowel. Vascular/Lymphatic: Aortic and branch vessel atherosclerosis. A suspicious aortocaval node is new at 12 mm on image 12/series 2. Peripancreatic 13 mm node on image 23/series 2. No pelvic sidewall adenopathy. Prior pelvic node dissection. Reproductive: Prostatectomy.  No locally recurrent disease. Other: No significant free fluid. Pelvic floor laxity. No evidence of omental or peritoneal disease. Fat containing periumbilical hernia. Musculoskeletal: Left proximal femoral fixation. Bilateral hip osteoarthritis. Right-sided L3 lesion is likely a hemangioma on image 38/ series 2 and is unchanged. Degenerative disc disease involves L4-5. IMPRESSION: 1. Findings most consistent with metastatic pancreatic carcinoma. Mass in the uncinate process of the pancreas with regional adenopathy and multiple liver metastasis. 2.  Tiny hiatal hernia. 3.  Aortic atherosclerosis. 4. Prostatectomy, without locally recurrent or pelvic nodal metastasis. These results will be called to the ordering clinician or representative by the Radiologist Assistant, and communication documented in the PACS or zVision Dashboard. Electronically Signed   By: Abigail Miyamoto M.D.   On: 04/26/2016 10:59   US Abdomen Complete  Result Date: 04/15/2016 CLINICAL DATA:  One week of right upper quadrant abdominal pain. Elevated liver function studies. History of previous cholecystectomy. Patient has history of diabetes and  gastroesophageal reflux. EXAM: ABDOMEN ULTRASOUND COMPLETE COMPARISON:  Abdominal and pelvic CT scan of November 04, 2014. FINDINGS: Gallbladder: The gallbladder surgically absent. Common bile duct: Diameter: 4.3 mm Liver: The hepatic echotexture is normal. There is a right lobe subcapsular cyst measuring 8 mm in diameter. In the left lobe there is a cyst measuring 9 mm in diameter. There is no intrahepatic ductal dilation. No solid appearing masses are observed. IVC: Bowel gas limits evaluation of the IVC. Pancreas: Bowel gas limits evaluation of the pancreatic head and tail. The pancreatic body is grossly normal. Spleen: Size and appearance within normal limits. Right Kidney: Length: 11.5 cm. The cortical echotexture of the right kidney is approximately equal to that of the adjacent liver. There is no focal mass nor hydronephrosis. Left Kidney: Length: 12.2 cm. The cortical echotexture is mildly increased similar to that on the  right. There is a lower pole cyst measuring 4.1 x 3.3 x 3.5 cm. There is no hydronephrosis. Abdominal aorta: No aneurysm visualized. Other findings: There is no ascites. IMPRESSION: 1. The gallbladder is surgically absent. There are simple cysts in the liver. No hepatic parenchymal abnormality otherwise is observed. There is limited visualization of the pancreatic head and tail. 2. Simple appearing lower pole cyst in the left kidney. The renal cortical echotexture of both kidneys is subjectively mildly increased which may reflect medical renal disease. There is no hydronephrosis. Electronically Signed   By: David  Martinique M.D.   On: 04/15/2016 10:06    Labs:  CBC:  Recent Labs  12/23/15 1220 05/04/16 1123  WBC 11.2* 15.5*  HGB 13.7 13.8  HCT 41.7 41.2  PLT 257 349    COAGS:  Recent Labs  12/23/15 1220 05/04/16 1123  INR 1.18 1.11  APTT  --  29    BMP: No results for input(s): NA, K, CL, CO2, GLUCOSE, BUN, CALCIUM, CREATININE, GFRNONAA, GFRAA in the last 8760  hours.  Invalid input(s): CMP  LIVER FUNCTION TESTS: No results for input(s): BILITOT, AST, ALT, ALKPHOS, PROT, ALBUMIN in the last 8760 hours.  TUMOR MARKERS: No results for input(s): AFPTM, CEA, CA199, CHROMGRNA in the last 8760 hours.  Assessment and Plan: Patient is a 80 year old male who presents for biopsy of liver lesion/mass identified on CT scan (11/27).  Patient has been NPO.  His blood thinners have been held appropriately.  Note his WBC are elevated today. He is afebrile.  Patient scheduled for procedure today with Dr. Earleen Newport.  Risks and Benefits discussed with the patient including, but not limited to bleeding, infection, damage to adjacent structures or low yield requiring additional tests. All of the patient's questions were answered, patient is agreeable to proceed. Consent signed and in chart.   Thank you for this interesting consult.  I greatly enjoyed meeting Howard Charles and look forward to participating in their care.  A copy of this report was sent to the requesting provider on this date.  Electronically Signed: Docia Barrier 05/04/2016, 12:51 PM   I spent a total of  15 Minutes in face to face in clinical consultation, greater than 50% of which was counseling/coordinating care for liver lesion/mass.

## 2016-05-07 ENCOUNTER — Telehealth: Payer: Self-pay | Admitting: *Deleted

## 2016-05-07 NOTE — Telephone Encounter (Signed)
Oncology Nurse Navigator Documentation  Oncology Nurse Navigator Flowsheets 05/07/2016  Navigator Location [facility]  Referral date to RadOnc/MedOnc 05/05/2016  Navigator Encounter Type Introductory phone call  Abnormal Finding Date 04/26/2016  Confirmed Diagnosis Date 05/04/2016  Spoke with patient and provided new patient appointment for 05/13/16 at 1:45/2:00 pm with Dr. Benay Spice. Informed of location of [facility], valet service, and registration process. Reminded to bring photo ID,  insurance cards and a current medication list, including supplements. Patient verbalizes understanding. Notified HIM of appointment.

## 2016-05-13 ENCOUNTER — Ambulatory Visit (HOSPITAL_BASED_OUTPATIENT_CLINIC_OR_DEPARTMENT_OTHER): Payer: Medicare Other | Admitting: Oncology

## 2016-05-13 ENCOUNTER — Telehealth: Payer: Self-pay | Admitting: Oncology

## 2016-05-13 ENCOUNTER — Other Ambulatory Visit: Payer: Self-pay | Admitting: *Deleted

## 2016-05-13 ENCOUNTER — Telehealth: Payer: Self-pay | Admitting: Nurse Practitioner

## 2016-05-13 ENCOUNTER — Encounter: Payer: Self-pay | Admitting: *Deleted

## 2016-05-13 VITALS — BP 106/58 | HR 77 | Temp 97.7°F | Resp 18 | Wt 182.0 lb

## 2016-05-13 DIAGNOSIS — C257 Malignant neoplasm of other parts of pancreas: Secondary | ICD-10-CM

## 2016-05-13 DIAGNOSIS — C259 Malignant neoplasm of pancreas, unspecified: Secondary | ICD-10-CM

## 2016-05-13 DIAGNOSIS — C787 Secondary malignant neoplasm of liver and intrahepatic bile duct: Secondary | ICD-10-CM | POA: Diagnosis not present

## 2016-05-13 MED ORDER — MORPHINE SULFATE ER 15 MG PO TBCR: 15.0000 mg | EXTENDED_RELEASE_TABLET | Freq: Two times a day (BID) | ORAL | 0 refills | Status: DC

## 2016-05-13 MED ORDER — HYDROMORPHONE HCL 4 MG PO TABS: 4.0000 mg | ORAL_TABLET | ORAL | 0 refills | Status: AC | PRN

## 2016-05-13 NOTE — Telephone Encounter (Signed)
Appointments scheduled per 12/14 LOS. Patient given AVS report and calendars with future scheduled appointments.  °

## 2016-05-13 NOTE — Progress Notes (Signed)
Oncology Nurse Navigator Documentation  Oncology Nurse Navigator Flowsheets 05/13/2016  Navigator Location [facility]  Referral date to RadOnc/MedOnc -  Navigator Encounter Type Initial MedOnc  Abnormal Finding Date -  Confirmed Diagnosis Date -  Patient Visit Type MedOnc;Initial  Treatment Phase Pre-Tx/Tx Discussion  Barriers/Navigation Needs Education;Coordination of Care  Education Understanding Cancer/ Treatment Options;Coping with Diagnosis/ Prognosis;Pain/ Symptom Management;Newly Diagnosed Cancer Education;Preparing for Upcoming Treatment  Interventions Coordination of Care;Education  Coordination of Care Radiology--PAC currently for 12/22 at [facility] 1130/1330--left message w/[facility] to see if they have something sooner (not on 12/19)  Education Method Verbal;Written;Teach-back  Support Groups/Services GI Support Group;[place]  Acuity Level 2  Time Spent with Patient 80  Met with patient and wife, Cecelia during new patient visit. Explained the role of the GI Nurse Navigator and provided New Patient Packet with information on: 1. Pancreas cancer--anatomy of pancreas, CA 19.9 test info; PAC information and showed them what port looks like; discussed pain management w/MS Contin & Dilaudid prn and constipation management 2. Support groups 3. Advanced Directives 4. Fall Safety Plan Answered questions, reviewed current treatment plan using TEACH back and provided emotional support. Provided copy of current treatment plan. Scheduled port for 12/19, but was informed by patient that he is going to [facility] for 2nd opinion that day. Next available at [facility] is 12/22 at 1:30 (too late to treat). Left VM for [facility] to determine if they have opening sooner. Left VM for wife to inquire if they are willing to go to [facility] if they can accommodate them? Reviewed side effects of Gemzar/Abraxane. Escorted to scheduler after appointment  completed.  [name], RN, BSN GI Oncology [place]

## 2016-05-13 NOTE — Progress Notes (Signed)
[place] New Patient Consult   Referring MD: Sten Dematteo 80 y.o.  Sep 30, 1935    Reason for Referral: Pancreas cancer   HPI: He reports solid dysphagia and "spitting "up a bilious material 6 months ago. The symptoms resolved when he resumed daily Protonix. A few weeks ago he developed intermittent right upper abdominal pain near the costal margin. He saw Dr. Shelia Media. An abdominal ultrasound on 04/15/2016 revealed no solid appearing mass in the liver. Bowel gas limited evaluation of the pancreas head. The pancreas body appeared normal. He was referred to Dr. Paulita Fujita. A CT of the abdomen and pelvis on 04/26/2016 revealed multiple hypoattenuating liver lesions, new compared to a CT from 2016. A new mass was noted in the pancreas uncinate measuring 3 x 3.3 cm. A new suspicious aortocaval node. 13 mm peripancreatic node. Prostatectomy without evidence of local tumor recurrence. No evidence of omental or peritoneal disease.  He was referred for an ultrasound-guided biopsy of a right liver lesion on 05/04/2016. The pathology ([id number]) he is confirmed adenocarcinoma. The morphologic features are consistent with metastatic pancreatic adenocarcinoma. A CA 19-9 obtained by Dr. Paulita Fujita returned elevated at 4211.  He discussed the pathology findings and prognosis with Dr. Paulita Fujita. He is referred for oncology evaluation.  Past Medical History:  Diagnosis Date  . Bilateral hearing loss    WEARS HEARING AIDS........RIGHT IS BETTER THEN LEFT  . Chronic renal insufficiency, stage III (moderate)    Creatinine 1.47  . Diabetes mellitus type 2 in obese ([place])   . Diabetic neuropathy ([place])   . Dysrhythmia    h/o A Fib  . GERD (gastroesophageal reflux disease)   . Gout   . Hypertension   . Ileus, postoperative ([place]) 10/22/2014  . Left knee DJD   . On supplemental oxygen therapy    at night  . PONV (postoperative nausea and vomiting)    H/O ....BUT LAST FEW  TIMES---NONE  . Right knee DJD     .  Prostate cancer                                                                                                                  age 57  Past Surgical History:  Procedure Laterality Date  . BOWEL DECOMPRESSION N/A 10/24/2014   Procedure: BOWEL DECOMPRESSION;  Surgeon: Clarene Essex, MD;  Location: [facility];  Service: Endoscopy;  Laterality: N/A;  . CHOLECYSTECTOMY    .     . COLONOSCOPY N/A 10/24/2014   Procedure: COLONOSCOPY;  Surgeon: Clarene Essex, MD;  Location: [facility] ENDOSCOPY;  Service: Endoscopy;  Laterality: N/A;  . COLONOSCOPY N/A 11/01/2014   Procedure: COLONOSCOPY;  Surgeon: Ronald Lobo, MD;  Location: [facility] ENDOSCOPY;  Service: Endoscopy;  Laterality: N/A;  . HEMORRHOID SURGERY    . INNER EAR SURGERY    . INTRAMEDULLARY (IM) NAIL INTERTROCHANTERIC Left 10/20/2014  . INTRAMEDULLARY (IM) NAIL INTERTROCHANTERIC Left 10/20/2014   Procedure: INTRAMEDULLARY (IM) NAIL INTERTROCHANTRIC;  Surgeon: [name], MD;  Location: [facility]  OR;  Service: Orthopedics;  Laterality: Left;  . KNEE ARTHROSCOPY  2008   left   . KNEE ARTHROSCOPY  2012   left  . KNEE ARTHROSCOPY  2002   right  . SHOULDER ARTHROSCOPY WITH ROTATOR CUFF REPAIR  2007  . SUPRAPUBIC PROSTATECTOMY     19 YRS  AGO  . TONSILLECTOMY    . TOTAL KNEE ARTHROPLASTY  06/26/2012   Procedure: TOTAL KNEE ARTHROPLASTY;  Surgeon: Lorn Junes, MD;  Location: [facility];  Service: Orthopedics;  Laterality: Left;  . TOTAL KNEE ARTHROPLASTY Right 05/21/2013   Procedure: TOTAL KNEE ARTHROPLASTY;  Surgeon: Lorn Junes, MD;  Location: [facility];  Service: Orthopedics;  Laterality: Right;    .  Prostatectomy  Medications: Reviewed  Allergies: No Known Allergies  Family history: His son was recently diagnosed with prostate cancer at age 57  Social History:   He is retired and lives with his wife in [place]. He worked in the [employer]. He does not use alcohol or cigarettes. No transfusion history. No  risk factor for HIV or hepatitis.   ROS:   Positives include: Constant pain in the lower back, intermittent pain at the right anterior costal margin, fatigue, 23 pound weight loss  A complete ROS was otherwise negative.  Physical Exam:  Blood pressure (!) 106/58, pulse 77, temperature 97.7 F (36.5 C), temperature source Oral, resp. rate 18, weight 182 lb (82.6 kg), SpO2 98 %.  HEENT: Oropharynx without visible mass, neck without mass Lungs: Clear bilaterally Cardiac: Regular rate and rhythm Abdomen: No hepatomegaly, no mass, no apparent ascites, nontender, no splenomegaly GU: Testes without mass  Vascular: No leg edema Lymph nodes: No cervical, supraclavicular, axillary, or inguinal nodes Neurologic: Alert and oriented, the motor exam appears intact in the upper and lower extremities bilaterally Skin: No rash Musculoskeletal: No spine tenderness   LAB:  CBC  Lab Results  Component Value Date   WBC 15.5 (H) 05/04/2016   HGB 13.8 05/04/2016   HCT 41.2 05/04/2016   MCV 88.6 05/04/2016   PLT 349 05/04/2016   NEUTROABS 7.4 12/23/2015     CMP: EOMI medicine 04/26/2016 BUN 28, creatinine 1.64, calcium 10.2, albumin 4, bilirubin 0.9, alk phosphatase 259, AST 28, ALT 33  CA 19-9 on 04/26/2016: 4211 CEA: 9.4    Imaging:  I reviewed the CT abdomen/pelvis images from 04/26/2016 with Mr. Ore and his wife   Assessment/Plan:   1. Metastatic pancreas cancer  Pancreas uncinate mass, multiple liver metastases, peripancreatic adenopathy  Ultrasound-guided biopsy of a right liver lesion on 05/04/2016 confirmed metastatic adenocarcinoma consistent with a pancreas primary  Markedly elevated CA 19-9  2.   Back/right abdominal pain secondary to #1  3.   Weight loss  4.   Diabetes  5.   History of prostate cancer  6.   Gout  7.   Diabetic neuropathy  8.   Renal insufficiency  9.   History rate relation   Disposition:  Mr. Yanko underwent biopsy of a right  liver lesion and the pathology confirmed metastatic adenocarcinoma. The pathology and clinical presentation are consistent with a diagnosis of metastatic pancreas cancer. I discussed the prognosis and treatment options with Mr. Monical and his wife. He understands no therapy will be curative. We discussed supportive care versus a trial of systemic therapy.   He would like to proceed with chemotherapy. We discussed single agent gemcitabine, gemcitabine/Abraxane, and FOLFIRINOX. I recommend gemcitabine/Abraxane. I think the morbidity associated with FOLFIRINOX would be  significant. His age and multiple comorbid condition would make FOLFIRINOX difficult.  We discussed the potential toxicities associated with gemcitabine/Abraxane regimen including the chance for nausea, alopecia, fever, rash, pneumonitis, and hematologic toxicity. We discussed the allergic reaction and neuropathy associated with Abraxane. He agrees to proceed. He will attend a chemotherapy teaching class. We discussed various treatment schedules for gemcitabine/Abraxane. I recommend he began treatment on an every 2 week schedule.  Mr. Viernes will be referred for placement of a Port-A-Cath with the plan to begin a first cycle of gemcitabine/Abraxane on 05/21/2016.  He plans to see Dr. Berneice Gandy for a second opinion on 05/18/2016. He will contact us if Dr. Berneice Gandy recommends a different treatment regimen.  He will begin MS Contin and Dilaudid for pain. Mr. Arave will contact us if he does not achieve adequate pain relief on this regimen.  We will refer him to the genetics counselor and nutritionist.  I recommended he discuss consolidation of his medical regimen with Dr. Shelia Media.  Approximately 50 minutes were spent with the patient today. The majority of the time was used for counseling and coordination of care.  Betsy Coder, MD  05/13/2016, 2:29 PM

## 2016-05-13 NOTE — Patient Instructions (Signed)
Care Plan Summary- 05/13/2016 Name: Howard Charles. Plucinski       DOB: Dec 26, 1935 Your Medical Team:  Medical Oncologist:  Dr. Ma Rings Radiation Oncologist:    Surgeon:  Type of Cancer: Metastatic Pancreas Cancer--adenocarcinoma  Stage/Grade: Stage IV *Exact staging of your cancer is based on size of the tumor, depth of invasion, involvement of lymph nodes or not, and whether or not the cancer has spread beyond the primary site.   Recommendations: Based on information available as of today's consult. Recommendations may change depending on the results of further tests or exams. 1) Systemic chemotherapy with Gemzar/Abraxane every other week       2)  Port placement at [facility]: -NPO after midnight and you need a driver. Hold Xarelto for a full 24 hours prior       3) Add MS Contin 15 mg every 12 hours for pain with additional Hydromorphone 4 mg: 1-2 tablets every 4 hours for breakthrough pain       4) Keep bowels moving with Miralax and add Senna-S 1-2/day if necessary _______________________________________________________________________ Next Steps:  1) PAC as scheduled. Will need chemo class on Friday or Monday 2) Call if pain not controlled 3) You will be called with the port date/time  Questions? [name], RN, BSN at [phone]. [name] is your Oncology Nurse Navigator and is available to assist you while you're receiving your [place].

## 2016-05-13 NOTE — Telephone Encounter (Signed)
Appointments scheduled per 12/14 LOS. Patient given AVS report and Calendars with future scheduled appointments.

## 2016-05-13 NOTE — Patient Instructions (Signed)
Care Plan Summary- 05/13/2016 Name: Howard Charles. [place]art       DOB: 1936-02-05 Your Medical Team:  Medical Oncologist:  Dr. Ma Rings Radiation Oncologist:    Surgeon:  Type of Cancer: Metastatic Pancreas Cancer--adenocarcinoma  Stage/Grade: Stage IV *Exact staging of your cancer is based on size of the tumor, depth of invasion, involvement of lymph nodes or not, and whether or not the cancer has spread beyond the primary site.   Recommendations: Based on information available as of today's consult. Recommendations may change depending on the results of further tests or exams. 1) Systemic chemotherapy with Gemzar/Abraxane every other week       2)  Port placement at [facility]: -NPO after midnight and you need a driver. Hold Xarelto for a full 24 hours prior       3) Add MS Contin 15 mg every 12 hours for pain with additional Hydromorphone 4 mg: 1-2 tablets every 4 hours for breakthrough pain       4) Keep bowels moving with Miralax and add Senna-S 1-2/day if necessary _______________________________________________________________________ Next Steps:  1) PAC as scheduled. Will need chemo class on Friday or Monday 2) Call if pain not controlled 3) You will be called with the port date/time  Questions? [name], RN, BSN at [phone]. [name] is your Oncology Nurse

## 2016-05-13 NOTE — Progress Notes (Signed)
START ON PATHWAY REGIMEN - Pancreatic  PANOS51: Nab-Paclitaxel (Abraxane) 125 mg/m2 D1, 8, 15 + Gemcitabine 1,000 mg/m2 D1, 8, 15 q28 Days Until Progression or Toxicity   A cycle is every 28 days:     Nab-paclitaxel (protein bound) (Abraxane(R)) 125 mg/m2 in NS to a concentration of 5 mg/mL given IV over 30 mins days 1, 8, and 15 followed by Dose Mod: None     Gemcitabine (Gemzar(R)) 1000 mg/m2 in 250 mL NS IV over 30 minutes days 1, 8, and 15. Dose Mod: None Additional Orders: Hold therapy and notify physician if [place] < 1500.  **Always confirm dose/schedule in your pharmacy ordering system**    Patient Characteristics: Adenocarcinoma, Metastatic Disease, First Line, PS = 0, 1 Current evidence of distant metastases? Yes AJCC T Stage: 2 AJCC N Stage: 1 AJCC Stage Grouping: IV AJCC M Stage: 1 Histology: Adenocarcinoma Line of therapy: First Line Would you be surprised if this patient died  in the next year? I would NOT be surprised if this patient died in the next year  Intent of Therapy: Non-Curative / Palliative Intent, Discussed with Patient

## 2016-05-14 ENCOUNTER — Telehealth: Payer: Self-pay | Admitting: *Deleted

## 2016-05-14 ENCOUNTER — Other Ambulatory Visit: Payer: Self-pay | Admitting: Radiology

## 2016-05-14 NOTE — Telephone Encounter (Signed)
Oncology Nurse Navigator Documentation  Oncology Nurse Navigator Flowsheets 05/14/2016  Navigator Location [facility]  Referral date to RadOnc/MedOnc -  Navigator Encounter Type Telephone  Telephone Outgoing Call;Appt Confirmation/Clarification  Abnormal Finding Date -  Confirmed Diagnosis Date -  Patient Visit Type -  Treatment Phase -  Barriers/Navigation Needs Coordination of Care--Port  Education -  Interventions Coordination of Care  Coordination of Care Appts;Other--message to collaborative nurse for EMLA and antiemetic  Education Method Verbal;Teach-back  Support Groups/Services -  Acuity -  Time Spent with Patient 15  Confirmed w/[facility] that port can be done there on 12/18 at 0930/1100--enter at main entrance and go left to registration. NPO after midnight and needs driver. Hold Xarelto for full 24 hours prior to procedure. Made patient aware of the appointment and he will be called regarding the chemo time for 12/22.

## 2016-05-14 NOTE — Telephone Encounter (Signed)
Per LOS I have scheduled appts and notified the scheduler 

## 2016-05-16 ENCOUNTER — Other Ambulatory Visit: Payer: Self-pay | Admitting: Oncology

## 2016-05-17 ENCOUNTER — Other Ambulatory Visit: Payer: Self-pay | Admitting: Oncology

## 2016-05-17 ENCOUNTER — Telehealth: Payer: Self-pay | Admitting: *Deleted

## 2016-05-17 ENCOUNTER — Encounter (HOSPITAL_COMMUNITY): Payer: Self-pay

## 2016-05-17 ENCOUNTER — Other Ambulatory Visit: Payer: Self-pay | Admitting: *Deleted

## 2016-05-17 ENCOUNTER — Ambulatory Visit (HOSPITAL_COMMUNITY)
Admission: RE | Admit: 2016-05-17 | Discharge: 2016-05-17 | Disposition: A | Discharge status: Home / Self Care | Payer: Medicare Other | Source: Ambulatory Visit | Attending: Oncology | Admitting: Oncology

## 2016-05-17 DIAGNOSIS — E669 Obesity, unspecified: Secondary | ICD-10-CM | POA: Diagnosis not present

## 2016-05-17 DIAGNOSIS — K429 Umbilical hernia without obstruction or gangrene: Secondary | ICD-10-CM | POA: Diagnosis not present

## 2016-05-17 DIAGNOSIS — C787 Secondary malignant neoplasm of liver and intrahepatic bile duct: Secondary | ICD-10-CM | POA: Insufficient documentation

## 2016-05-17 DIAGNOSIS — C257 Malignant neoplasm of other parts of pancreas: Secondary | ICD-10-CM

## 2016-05-17 DIAGNOSIS — Z9889 Other specified postprocedural states: Secondary | ICD-10-CM | POA: Insufficient documentation

## 2016-05-17 DIAGNOSIS — H9193 Unspecified hearing loss, bilateral: Secondary | ICD-10-CM | POA: Diagnosis not present

## 2016-05-17 DIAGNOSIS — Z9079 Acquired absence of other genital organ(s): Secondary | ICD-10-CM | POA: Insufficient documentation

## 2016-05-17 DIAGNOSIS — E1122 Type 2 diabetes mellitus with diabetic chronic kidney disease: Secondary | ICD-10-CM | POA: Insufficient documentation

## 2016-05-17 DIAGNOSIS — I129 Hypertensive chronic kidney disease with stage 1 through stage 4 chronic kidney disease, or unspecified chronic kidney disease: Secondary | ICD-10-CM | POA: Diagnosis not present

## 2016-05-17 DIAGNOSIS — M545 Low back pain: Secondary | ICD-10-CM | POA: Diagnosis not present

## 2016-05-17 DIAGNOSIS — M109 Gout, unspecified: Secondary | ICD-10-CM | POA: Diagnosis not present

## 2016-05-17 DIAGNOSIS — C259 Malignant neoplasm of pancreas, unspecified: Secondary | ICD-10-CM | POA: Diagnosis not present

## 2016-05-17 DIAGNOSIS — Z794 Long term (current) use of insulin: Secondary | ICD-10-CM | POA: Diagnosis not present

## 2016-05-17 DIAGNOSIS — N281 Cyst of kidney, acquired: Secondary | ICD-10-CM | POA: Diagnosis not present

## 2016-05-17 DIAGNOSIS — E1140 Type 2 diabetes mellitus with diabetic neuropathy, unspecified: Secondary | ICD-10-CM | POA: Insufficient documentation

## 2016-05-17 DIAGNOSIS — I700 Atherosclerosis of aorta: Secondary | ICD-10-CM | POA: Insufficient documentation

## 2016-05-17 DIAGNOSIS — M5136 Other intervertebral disc degeneration, lumbar region: Secondary | ICD-10-CM | POA: Insufficient documentation

## 2016-05-17 DIAGNOSIS — M160 Bilateral primary osteoarthritis of hip: Secondary | ICD-10-CM | POA: Insufficient documentation

## 2016-05-17 DIAGNOSIS — Z96651 Presence of right artificial knee joint: Secondary | ICD-10-CM | POA: Insufficient documentation

## 2016-05-17 DIAGNOSIS — R7989 Other specified abnormal findings of blood chemistry: Secondary | ICD-10-CM | POA: Insufficient documentation

## 2016-05-17 DIAGNOSIS — Z9049 Acquired absence of other specified parts of digestive tract: Secondary | ICD-10-CM | POA: Diagnosis not present

## 2016-05-17 DIAGNOSIS — K219 Gastro-esophageal reflux disease without esophagitis: Secondary | ICD-10-CM | POA: Insufficient documentation

## 2016-05-17 DIAGNOSIS — R1011 Right upper quadrant pain: Secondary | ICD-10-CM | POA: Insufficient documentation

## 2016-05-17 DIAGNOSIS — K449 Diaphragmatic hernia without obstruction or gangrene: Secondary | ICD-10-CM | POA: Diagnosis not present

## 2016-05-17 DIAGNOSIS — N183 Chronic kidney disease, stage 3 (moderate): Secondary | ICD-10-CM | POA: Diagnosis not present

## 2016-05-17 LAB — PROTIME-INR
INR: 1.53
PROTHROMBIN TIME: 18.5 s — AB (ref 11.4–15.2)

## 2016-05-17 LAB — BASIC METABOLIC PANEL
ANION GAP: 9 (ref 5–15)
BUN: 53 mg/dL — ABNORMAL HIGH (ref 6–20)
CALCIUM: 10 mg/dL (ref 8.9–10.3)
CO2: 24 mmol/L (ref 22–32)
CREATININE: 2.03 mg/dL — AB (ref 0.61–1.24)
Chloride: 94 mmol/L — ABNORMAL LOW (ref 101–111)
GFR calc Af Amer: 34 mL/min — ABNORMAL LOW (ref >60)
GFR calc non Af Amer: 30 mL/min — ABNORMAL LOW (ref >60)
GLUCOSE: 148 mg/dL — AB (ref 65–99)
Potassium: 4.9 mmol/L (ref 3.5–5.1)
Sodium: 127 mmol/L — ABNORMAL LOW (ref 135–145)

## 2016-05-17 LAB — GLUCOSE, CAPILLARY: Glucose-Capillary: 146 mg/dL — ABNORMAL HIGH (ref 65–99)

## 2016-05-17 LAB — APTT: aPTT: 33 seconds (ref 24–36)

## 2016-05-17 LAB — CBC
HCT: 40.6 % (ref 39.0–52.0)
HEMOGLOBIN: 13.7 g/dL (ref 13.0–17.0)
MCH: 30 pg (ref 26.0–34.0)
MCHC: 33.7 g/dL (ref 30.0–36.0)
MCV: 89 fL (ref 78.0–100.0)
Platelets: 287 10*3/uL (ref 150–400)
RBC: 4.56 MIL/uL (ref 4.22–5.81)
RDW: 15.7 % — ABNORMAL HIGH (ref 11.5–15.5)
WBC: 16.6 10*3/uL — ABNORMAL HIGH (ref 4.0–10.5)

## 2016-05-17 MED ORDER — PROCHLORPERAZINE MALEATE 10 MG PO TABS: 10.0000 mg | ORAL_TABLET | Freq: Four times a day (QID) | ORAL | 0 refills | Status: AC | PRN

## 2016-05-17 MED ORDER — LIDOCAINE-PRILOCAINE 2.5-2.5 % EX CREA: 1.0000 "application " | TOPICAL_CREAM | CUTANEOUS | 0 refills | Status: AC | PRN

## 2016-05-17 MED ORDER — CEFAZOLIN SODIUM-DEXTROSE 2-4 GM/100ML-% IV SOLN: 2.0000 g | INTRAVENOUS | Status: DC

## 2016-05-17 MED ORDER — SODIUM CHLORIDE 0.9 % IV SOLN: INTRAVENOUS | Status: DC

## 2016-05-17 NOTE — Telephone Encounter (Signed)
Oncology Nurse Navigator Documentation  Oncology Nurse Navigator Flowsheets 05/17/2016  Navigator Location [facility]  Referral date to RadOnc/MedOnc -  Navigator Encounter Type Telephone  Telephone Incoming Call;Symptom Mgt--MS Contin too sedating and no BM in 2 days. Difficult to get up/down stairs. Asking for hospital bed  Abnormal Finding Date -  Confirmed Diagnosis Date -  Patient Visit Type -  Treatment Phase -  Barriers/Navigation Needs Family concerns;Education  Education Pain/ Symptom Management--suggested they try to reduce MS Contin to once/day. Increase Miralax to bid and try 1/2 bottle of magnesium citrate today and repeat tomorrow if no BM.  Interventions Referrals--[facility]  Referrals Home Health  Coordination of Care Home Health--DME orders and called liaison, Jermaine  Education Method Verbal;Teach-back  Support Groups/Services -  Acuity -  Time Spent with Patient 15  He has only been taking the Miralax daily for bowels. Also has not had any hydromorphone since Friday, but feels the MS Contin sedates him too much-speech was slurred over the weekend. They will reduce MS Contin to daily.

## 2016-05-17 NOTE — Progress Notes (Signed)
Patient ID: Howard Charles, male   DOB: [birth year], 80 y.o.   MRN: [id number] CBC reveals and unexplained elevated WBC count, at 16K. He is not on corticosteroids. He is to undergo his first treatment via Periferal IV.

## 2016-05-17 NOTE — Telephone Encounter (Signed)
Per Dr. Barbie Banner, will need to postpone his PAC insertion due to elevated WBC today at 16.6, which is higher than last reading on 05/04/16. No obvious s/s of infection.  Will FYI Dr. Benay Spice w/developement and he has labs scheduled for 12/20 to follow up on this. Can administer his 1st treatment peripherally on 12/22 if he agrees if counts are improved. Patient goes to [place] on 12/19 for his 2nd opinion.

## 2016-05-17 NOTE — H&P (Signed)
Chief Complaint: Patient was seen in consultation today for [facility] a catheter placement at the request of Sherrill,Gary B  Referring Physician(s): Ladell Pier  Supervising Physician: Marybelle Killings  Patient Status: [facility] - Out-pt  History of Present Illness: Howard Charles is a 80 y.o. male   Developed abd pain Has had N/V x weeks Work up reveals liver lesions and pancreatic mass CT 04/26/2016: IMPRESSION: 1. Findings most consistent with metastatic pancreatic carcinoma. Mass in the uncinate process of the pancreas with regional adenopathy and multiple liver metastasis. 2.  Tiny hiatal hernia. 3.  Aortic atherosclerosis. 4. Prostatectomy, without locally recurrent or pelvic nodal Metastasis.  Bx 05/04/2016 Liver, needle/core biopsy - ADENOCARCINOMA. Microscopic Comment The morphologic features are compatible with metastatic pancreatic adenocarcinoma.   Now scheduled to start chemotherapy 05/21/16 per Dr Benay Spice Scheduled for [facility] today   Past Medical History:  Diagnosis Date  . Bilateral hearing loss    WEARS HEARING AIDS........RIGHT IS BETTER THEN LEFT  . Chronic renal insufficiency, stage III (moderate)    Creatinine 1.47  . Diabetes mellitus type 2 in obese ([place])   . Diabetic neuropathy ([place])   . Dysrhythmia    h/o A Fib  . GERD (gastroesophageal reflux disease)   . Gout   . Hypertension   . Ileus, postoperative ([place]) 10/22/2014  . Left knee DJD   . On supplemental oxygen therapy    at night  . PONV (postoperative nausea and vomiting)    H/O ....BUT LAST FEW TIMES---NONE  . Right knee DJD     Past Surgical History:  Procedure Laterality Date  . BOWEL DECOMPRESSION N/A 10/24/2014   Procedure: BOWEL DECOMPRESSION;  Surgeon: Clarene Essex, MD;  Location: [place];  Service: Endoscopy;  Laterality: N/A;  . CHOLECYSTECTOMY    . COLONOSCOPY    . COLONOSCOPY N/A 10/24/2014   Procedure: COLONOSCOPY;  Surgeon: Clarene Essex, MD;  Location[facility] ENDOSCOPY;   Service: Endoscopy;  Laterality: N/A;  . COLONOSCOPY N/A 11/01/2014   Procedure: COLONOSCOPY;  Surgeon: Ronald Lobo, MD;  Location: [facility] ENDOSCOPY;  Service: Endoscopy;  Laterality: N/A;  . HEMORRHOID SURGERY    . INNER EAR SURGERY    . INTRAMEDULLARY (IM) NAIL INTERTROCHANTERIC Left 10/20/2014  . INTRAMEDULLARY (IM) NAIL INTERTROCHANTERIC Left 10/20/2014   Procedure: INTRAMEDULLARY (IM) NAIL INTERTROCHANTRIC;  Surgeon: [name], MD;  Location: [place];  Service: Orthopedics;  Laterality: Left;  . KNEE ARTHROSCOPY  2008   left   . KNEE ARTHROSCOPY  2012   left  . KNEE ARTHROSCOPY  2002   right  . SHOULDER ARTHROSCOPY WITH ROTATOR CUFF REPAIR  2007  . SUPRAPUBIC PROSTATECTOMY     19 YRS  AGO  . TONSILLECTOMY    . TOTAL KNEE ARTHROPLASTY  06/26/2012   Procedure: TOTAL KNEE ARTHROPLASTY;  Surgeon: Lorn Junes, MD;  Location: [place];  Service: Orthopedics;  Laterality: Left;  . TOTAL KNEE ARTHROPLASTY Right 05/21/2013   Procedure: TOTAL KNEE ARTHROPLASTY;  Surgeon: Lorn Junes, MD;  Location: [place];  Service: Orthopedics;  Laterality: Right;    Allergies: Patient has no known allergies.  Medications: Prior to Admission medications   Medication Sig Start Date End Date Taking? Authorizing Provider  acetaminophen (TYLENOL) 500 MG tablet Take 500 mg by mouth every 6 (six) hours as needed.   Yes Historical Provider, MD  allopurinol (ZYLOPRIM) 300 MG tablet Take 300 mg by mouth every morning.    Yes Historical Provider, MD  calcium carbonate (CALCIUM 600) 600  MG TABS tablet Take 600 mg by mouth daily with breakfast.   Yes Historical Provider, MD  cholecalciferol (VITAMIN D) 1000 units tablet Take 1,000 Units by mouth daily.   Yes Historical Provider, MD  Dulaglutide (TRULICITY) A999333 [id number] SOPN Inject 0.75 mg into the skin once a week.   Yes Historical Provider, MD  flecainide (TAMBOCOR) 100 MG tablet Take 100 mg by mouth 2 (two) times daily.   Yes Historical Provider, MD    HYDROcodone-acetaminophen (NORCO/VICODIN) 5-325 MG tablet Take 1 tablet by mouth every 6 (six) hours as needed for moderate pain.   Yes Historical Provider, MD  HYDROmorphone (DILAUDID) 4 MG tablet Take 1-2 tablets (4-8 mg total) by mouth every 4 (four) hours as needed for severe pain. 05/13/16  Yes Ladell Pier, MD  insulin glargine (LANTUS) 100 UNIT/ML injection Inject 0.2 mLs (20 Units total) into the skin at bedtime. Patient taking differently: Inject 56 Units into the skin daily.  11/12/14  Yes Kelvin Cellar, MD  metoprolol tartrate (LOPRESSOR) 25 MG tablet Take 1 tablet (25 mg total) by mouth every 8 (eight) hours. Patient taking differently: Take 25 mg by mouth 2 (two) times daily.  11/12/14  Yes Kelvin Cellar, MD  morphine (MS CONTIN) 15 MG 12 hr tablet Take 1 tablet (15 mg total) by mouth every 12 (twelve) hours. 05/13/16  Yes Ladell Pier, MD  Multiple Vitamins-Minerals (CENTRUM SILVER ULTRA MENS PO) Take 1 tablet by mouth daily.   Yes Historical Provider, MD  omega-3 acid ethyl esters (LOVAZA) 1 G capsule Take 1 g by mouth every morning.   Yes Historical Provider, MD  pantoprazole (PROTONIX) 40 MG tablet Take 40 mg by mouth every evening.    Yes Historical Provider, MD  Rivaroxaban (XARELTO) 15 MG TABS tablet Take 15 mg by mouth every morning.    Yes Historical Provider, MD  rosuvastatin (CRESTOR) 10 MG tablet Take 10 mg by mouth every evening.    Yes Historical Provider, MD  telmisartan (MICARDIS) 20 MG tablet Take 20 mg by mouth daily.   Yes Historical Provider, MD     Family History  Problem Relation Age of Onset  . Hypertension Mother   . Diabetes Mother   . Heart disease Mother   . Heart disease Father   . Heart attack Father   . Hypertension Father   . Diabetes Mellitus II Father     Social History   Social History  . Marital status: Married    Spouse name: N/A  . Number of children: N/A  . Years of education: N/A   Social History Main Topics  . Smoking  status: Never Smoker  . Smokeless tobacco: Never Used  . Alcohol use No  . Drug use: No  . Sexual activity: Not Asked   Other Topics Concern  . None   Social History Narrative   Married, wife Cecelia with #2 grown sons   Lives in 2 story home w/basement-negotiates stairs well   Retired from [employer], goes to gym    Review of Systems: A 12 point ROS discussed and pertinent positives are indicated in the HPI above.  All other systems are negative.  Review of Systems  Constitutional: Positive for activity change, appetite change and fatigue. Negative for fever.  Respiratory: Negative for shortness of breath.   Gastrointestinal: Positive for abdominal pain.  Musculoskeletal: Negative for back pain.  Neurological: Positive for weakness.  Psychiatric/Behavioral: Negative for behavioral problems and confusion.  Vital Signs: BP 107/66   Pulse 90   Temp 97.6 F (36.4 C)   Resp 18   Ht 5\' 10"  (1.778 m)   Wt 182 lb (82.6 kg)   SpO2 98%   BMI 26.11 kg/m   Physical Exam  Constitutional: He is oriented to person, place, and time.  Cardiovascular: Normal rate and regular rhythm.   Pulmonary/Chest: Effort normal and breath sounds normal.  Abdominal: Soft. Bowel sounds are normal. There is tenderness.  Musculoskeletal: Normal range of motion.  Neurological: He is alert and oriented to person, place, and time.  Skin: Skin is warm and dry.  Psychiatric: He has a normal mood and affect. His behavior is normal. Judgment and thought content normal.  Nursing note and vitals reviewed.   Mallampati Score:  MD Evaluation Airway: WNL Heart: WNL Abdomen: WNL Chest/ Lungs: WNL ASA  Classification: 3 Mallampati/Airway Score: One  Imaging: Ct Abdomen Pelvis Wo Contrast  Result Date: 04/26/2016 CLINICAL DATA:  Right upper quadrant pain for several weeks. Elevated liver function tests. Right low back pain today. Diabetic. Hypertension. Prostatectomy for cancer.  Cholecystectomy. EXAM: CT ABDOMEN AND PELVIS WITHOUT CONTRAST TECHNIQUE: Multidetector CT imaging of the abdomen and pelvis was performed following the standard protocol without IV contrast. COMPARISON:  04/15/2016 abdominal ultrasound. 11/04/2014 abdominal pelvic CT FINDINGS: Lower chest: Clear lung bases. Normal heart size without pericardial or pleural effusion. Tiny hiatal hernia. Hepatobiliary: The hepatic dome is excluded. Multiple hypo attenuating liver lesions which are new since the 2016 exam. Suboptimally evaluated on this noncontrast exam. Example in the right hepatic lobe at 1.3 cm on image 14/series 3. Cholecystectomy, without biliary ductal dilatation. Pancreas: Fatty replacement involves the pancreatic body, neck, and head. A new mass in the pancreatic uncinate process measures 3.0 x 3.3 cm on image 26/series 2. No acute pancreatitis. Spleen: Normal in size, without focal abnormality. Adrenals/Urinary Tract: Normal adrenal glands. Mild renal cortical thinning bilaterally. A lower pole a lower pole left renal cyst. No hydronephrosis. No bladder calculi. Stomach/Bowel: Normal remainder of the stomach. Normal colon and terminal ileum. Normal small bowel. Vascular/Lymphatic: Aortic and branch vessel atherosclerosis. A suspicious aortocaval node is new at 12 mm on image 12/series 2. Peripancreatic 13 mm node on image 23/series 2. No pelvic sidewall adenopathy. Prior pelvic node dissection. Reproductive: Prostatectomy.  No locally recurrent disease. Other: No significant free fluid. Pelvic floor laxity. No evidence of omental or peritoneal disease. Fat containing periumbilical hernia. Musculoskeletal: Left proximal femoral fixation. Bilateral hip osteoarthritis. Right-sided L3 lesion is likely a hemangioma on image 38/ series 2 and is unchanged. Degenerative disc disease involves L4-5. IMPRESSION: 1. Findings most consistent with metastatic pancreatic carcinoma. Mass in the uncinate process of the pancreas  with regional adenopathy and multiple liver metastasis. 2.  Tiny hiatal hernia. 3.  Aortic atherosclerosis. 4. Prostatectomy, without locally recurrent or pelvic nodal metastasis. These results will be called to the ordering clinician or representative by the Radiologist Assistant, and communication documented in the PACS or zVision Dashboard. Electronically Signed   By: Abigail Miyamoto M.D.   On: 04/26/2016 10:59   US Biopsy  Result Date: 05/04/2016 INDICATION: 80 year old male with pancreatic head lesion. Multiple liver lesions. He has been referred for evaluation for percutaneous biopsy EXAM: ULTRASOUND BIOPSY CORE LIVER MEDICATIONS: None. ANESTHESIA/SEDATION: Moderate (conscious) sedation was employed during this procedure. A total of Versed 3.0 mg and Fentanyl 50 mcg was administered intravenously. Moderate Sedation Time: 10 minutes. The patient's level of consciousness and vital signs were monitored  continuously by radiology nursing throughout the procedure under my direct supervision. FLUOROSCOPY TIME:  None COMPLICATIONS: None PROCEDURE: Informed written consent was obtained from the patient after a thorough discussion of the procedural risks, benefits and alternatives. All questions were addressed. Maximal Sterile Barrier Technique was utilized including caps, mask, sterile gowns, sterile gloves, sterile drape, hand hygiene and skin antiseptic. A timeout was performed prior to the initiation of the procedure. Patient positioned supine position. Ultrasound survey of the right upper abdomen was performed with images stored and sent to PACs. The skin and subcutaneous tissues were generously infiltrated 1% lidocaine for local anesthesia. Using ultrasound guidance, 17 gauge guide needle was advanced into hypoechoic lesion of the right liver lobe. Once the needle tip was confirmed, multiple 18 gauge core biopsy were achieved. Gel-Foam pledget was infused with a small amount of saline. Needle was removed and a  final image was stored. Patient tolerated the procedure well and remained hemodynamically stable throughout. No complications were encountered and no significant blood loss encountered. IMPRESSION: Status post ultrasound-guided biopsy of right-sided liver lesion. Tissue specimen sent to pathology for complete histopathologic analysis. Signed, Dulcy Fanny. Earleen Newport, DO [facility] Electronically Signed   By: Corrie Mckusick D.O.   On: 05/04/2016 14:51    Labs:  CBC:  Recent Labs  12/23/15 1220 05/04/16 1123  WBC 11.2* 15.5*  HGB 13.7 13.8  HCT 41.7 41.2  PLT 257 349    COAGS:  Recent Labs  12/23/15 1220 05/04/16 1123  INR 1.18 1.11  APTT  --  29    BMP: No results for input(s): NA, K, CL, CO2, GLUCOSE, BUN, CALCIUM, CREATININE, GFRNONAA, GFRAA in the last 8760 hours.  Invalid input(s): CMP  LIVER FUNCTION TESTS: No results for input(s): BILITOT, AST, ALT, ALKPHOS, PROT, ALBUMIN in the last 8760 hours.  TUMOR MARKERS: No results for input(s): AFPTM, CEA, CA199, CHROMGRNA in the last 8760 hours.  Assessment and Plan:  New metastatic pancreatic cancer diagnosis To start chemotherapy 12/22 Scheduled for Port a Cath placement today Risks and Benefits discussed with the patient including, but not limited to bleeding, infection, pneumothorax, or fibrin sheath development and need for additional procedures. All of the patient's questions were answered, patient is agreeable to proceed. Consent signed and in chart.   Thank you for this interesting consult.  I greatly enjoyed meeting KRAIG BONDI and look forward to participating in their care.  A copy of this report was sent to the requesting provider on this date.  Electronically Signed: Norinne Jeane A 05/17/2016, 10:12 AM   I spent a total of  30 Minutes   in face to face in clinical consultation, greater than 50% of which was counseling/coordinating care for [facility]  placement

## 2016-05-18 ENCOUNTER — Other Ambulatory Visit: Payer: Self-pay | Admitting: *Deleted

## 2016-05-18 ENCOUNTER — Telehealth: Payer: Self-pay | Admitting: *Deleted

## 2016-05-18 ENCOUNTER — Other Ambulatory Visit (HOSPITAL_COMMUNITY): Payer: Medicare Other

## 2016-05-18 DIAGNOSIS — C257 Malignant neoplasm of other parts of pancreas: Secondary | ICD-10-CM

## 2016-05-18 NOTE — Telephone Encounter (Signed)
Oncology Nurse Navigator Documentation  Oncology Nurse Navigator Flowsheets 05/18/2016  Navigator Location [facility]  Referral date to RadOnc/MedOnc -  Navigator Encounter Type Telephone  Telephone Outgoing Call;Appt Confirmation/Clarification  Abnormal Finding Date -  Confirmed Diagnosis Date -  Patient Visit Type -  Treatment Phase Pre-Tx/Tx Discussion  Barriers/Navigation Needs Education;Coordination of Care  Education Other--would not use antibiotic for elevated WBC with no obvious source of infection.   Interventions Coordination of Care--order placed to have PAC next week prior to 2nd tx on 1/4.  Referrals -  Coordination of Care Radiology  Education Method Verbal  Support Groups/Services -  Acuity -  Time Spent with Patient 15  Per Dr. Benay Spice, patient has a chronically elevated WBC. Would place port regardless unless he has obvious source of infection. Re-entered order with this comment. Wife made aware that we can run IV saline with the Gemzar to lessen the potential of vein irritation.

## 2016-05-18 NOTE — Telephone Encounter (Signed)
Wbc chronically elevated. Would go ahead with port unless there is evidence of a current infection, but Ok to treat peripheral with cycle 1

## 2016-05-18 NOTE — Telephone Encounter (Signed)
Port rescheduled for 12/28 at 1230/1400 at [facility] radiology. NPO after 0800 and hold Xarelto full 24 hours prior. Will need a driver. Left this on his home VM and will review with him as well on 12/22.

## 2016-05-18 NOTE — Telephone Encounter (Signed)
Wife left VM that she called [facility] to f/u on hospital bed and was told they have not received the order navigator put in EPIC on 12/18. Printed order off and faxed to [facility] [phone]

## 2016-05-19 ENCOUNTER — Telehealth: Payer: Self-pay | Admitting: *Deleted

## 2016-05-19 ENCOUNTER — Other Ambulatory Visit (HOSPITAL_BASED_OUTPATIENT_CLINIC_OR_DEPARTMENT_OTHER): Payer: Medicare Other

## 2016-05-19 ENCOUNTER — Other Ambulatory Visit: Payer: Self-pay | Admitting: *Deleted

## 2016-05-19 ENCOUNTER — Encounter: Payer: Self-pay | Admitting: General Practice

## 2016-05-19 ENCOUNTER — Other Ambulatory Visit: Payer: Medicare Other

## 2016-05-19 ENCOUNTER — Encounter: Payer: Self-pay | Admitting: *Deleted

## 2016-05-19 DIAGNOSIS — C259 Malignant neoplasm of pancreas, unspecified: Secondary | ICD-10-CM

## 2016-05-19 DIAGNOSIS — C787 Secondary malignant neoplasm of liver and intrahepatic bile duct: Principal | ICD-10-CM

## 2016-05-19 DIAGNOSIS — C257 Malignant neoplasm of other parts of pancreas: Secondary | ICD-10-CM | POA: Diagnosis not present

## 2016-05-19 LAB — CBC WITH DIFFERENTIAL/PLATELET
BASO%: 0.9 % (ref 0.0–2.0)
BASOS ABS: 0.2 10*3/uL — AB (ref 0.0–0.1)
EOS%: 4.2 % (ref 0.0–7.0)
Eosinophils Absolute: 0.8 10*3/uL — ABNORMAL HIGH (ref 0.0–0.5)
HEMATOCRIT: 45.9 % (ref 38.4–49.9)
HGB: 14.6 g/dL (ref 13.0–17.1)
LYMPH#: 1.4 10*3/uL (ref 0.9–3.3)
LYMPH%: 7.1 % — AB (ref 14.0–49.0)
MCH: 28.8 pg (ref 27.2–33.4)
MCHC: 31.8 g/dL — AB (ref 32.0–36.0)
MCV: 90.6 fL (ref 79.3–98.0)
MONO#: 1.5 10*3/uL — ABNORMAL HIGH (ref 0.1–0.9)
MONO%: 7.6 % (ref 0.0–14.0)
NEUT#: 16 10*3/uL — ABNORMAL HIGH (ref 1.5–6.5)
NEUT%: 80.2 % — AB (ref 39.0–75.0)
Platelets: 426 10*3/uL — ABNORMAL HIGH (ref 140–400)
RBC: 5.07 10*6/uL (ref 4.20–5.82)
RDW: 16 % — ABNORMAL HIGH (ref 11.0–14.6)
WBC: 19.9 10*3/uL — ABNORMAL HIGH (ref 4.0–10.3)

## 2016-05-19 LAB — COMPREHENSIVE METABOLIC PANEL
ALT: 75 U/L — ABNORMAL HIGH (ref 0–55)
AST: 61 U/L — AB (ref 5–34)
Albumin: 2.4 g/dL — ABNORMAL LOW (ref 3.5–5.0)
Alkaline Phosphatase: 747 U/L — ABNORMAL HIGH (ref 40–150)
Anion Gap: 10 mEq/L (ref 3–11)
BUN: 72.5 mg/dL — AB (ref 7.0–26.0)
CALCIUM: 11.7 mg/dL — AB (ref 8.4–10.4)
CHLORIDE: 96 meq/L — AB (ref 98–109)
CO2: 24 mEq/L (ref 22–29)
Creatinine: 2.4 mg/dL — ABNORMAL HIGH (ref 0.7–1.3)
EGFR: 25 mL/min/{1.73_m2} — ABNORMAL LOW (ref >90)
Glucose: 181 mg/dl — ABNORMAL HIGH (ref 70–140)
POTASSIUM: 6.2 meq/L — AB (ref 3.5–5.1)
Sodium: 130 mEq/L — ABNORMAL LOW (ref 136–145)
Total Bilirubin: 1.11 mg/dL (ref 0.20–1.20)
Total Protein: 6.6 g/dL (ref 6.4–8.3)

## 2016-05-19 MED ORDER — SODIUM POLYSTYRENE SULFONATE 15 GM/60ML PO SUSP: 30.0000 g | Freq: Once | ORAL | 0 refills | Status: AC

## 2016-05-19 MED FILL — SPS 15 GM/60 ML SUSPENSION: 15 | 1 days supply | Qty: 120 | Fill #0

## 2016-05-19 NOTE — Telephone Encounter (Signed)
Wife called to request [facility] to be called instead of [facility], since they know people who work with [employer].  Called Manus Gunning with [facility] [phone]) with new referral and that Dr. Benay Spice will be his attending. They will reach out to wife today.

## 2016-05-19 NOTE — Telephone Encounter (Addendum)
Son called to report labs were redrawn and they are waiting in the car for results. Called him back and requested they drive to front entrance and navigator will speak with them. Patient looks very weak, but is able to talk with RN. He understands the need for his K+ level to be corrected. Made him aware of the elevation in liver functions, renal functions as well and that he can't have chemotherapy with these results, so the chemo and port has been cancelled. He agrees that Hospice is a good option for him and agrees to this referral.  Per Dr. Alvy Bimler: call in Kayexalate 30 grams (120 ml) for one dose. Will also make Dr. Benay Spice aware of the lab results from today. Noted calcium is elevated as well today. Yohann will return to clinic on 12/26 unless he needs to be seen sooner. Called his [facility] pharmacy: they do not carry Kayexalate. [place] does not carry it--will obtain drug from inpatient [facility] pharmacy and transfer to Outpatient Pharmacy if it is in stock. Pharmacist will notify navigator of status after talking with inpatient pharmacy. OP closes at 6pm. @ 1725: Kayexalate delivered from inpatient to outpatient pharmacy and son called to pick up medication before 6 pm.

## 2016-05-19 NOTE — Progress Notes (Signed)
[facility] Spiritual Care Note  Mailed handwritten note of encouragement.   [place], [place], [facility] Pager [phone] Voicemail [phone]

## 2016-05-19 NOTE — Progress Notes (Signed)
Oncology Nurse Navigator Documentation  Oncology Nurse Navigator Flowsheets 05/19/2016  Navigator Location [facility]  Referral date to RadOnc/MedOnc -  Navigator Encounter Type Other  Telephone Outgoing Call--call to [facility] for referral  Abnormal Finding Date -  Confirmed Diagnosis Date -  Patient Visit Type -  Treatment Phase Other--Supportive care  Barriers/Navigation Needs Education;Coordination of Care  Education Pain/ Symptom Management;Coping with Diagnosis/ Prognosis  Interventions Referrals;Education  Referrals -  Coordination of Care Hospice/Palliative Care;Appts--return lab appointment today  Education Method Verbal;Teach-back  Support Groups/Services -  Acuity -  Time Spent with Patient 45  Spoke with wife after chemo education class (she attended and sent [place] home with their son). His WBC continues to increase and his K+ according to running twice is 6.0. Also liver enzymes are significantly elevated. Made her aware of this and that his chemo for 12/22 is cancelled per Dr. Benay Spice message today as well as his labs would not allow chemotherapy to be given. Lab suggests redraw to check the K+ today and this could be corrected with one dose of Kayexalate per Dr. Burr Medico. Wife will have him return at 3 pm for lab or call nurse if they decide not to come in. Mrs. Kater requests to proceed with the Hospice referral for her husband and requests navigator to call the referral in today. [place] and they will contact Mrs. Bruderer and set up time to meet with them on 12/21. Requested Hospice work on getting him a hospital bed asap. Had called [facility] earlier today and was told it is in insurance review and MD needs to complete a certificate of need (they will fax form-has not yet been received). Hospice will work on this as soon as referral is processed. Code status was not discussed with the wife today. She understands his life span is limited and  she feels he won't live 3 months.

## 2016-05-19 NOTE — Progress Notes (Signed)
[facility] Spiritual Care Note  Met Mr Liendo wife/caregiver in chemo class this morning.  She engaged readily and shared some of her emotional distress.  I plan to send the [facility] a f/u handwritten note of support and encouragement to reach out anytime.  Also plan to f/u in infusion for additional care.   [place], [place], [facility] Pager [phone] Voicemail [phone]

## 2016-05-20 ENCOUNTER — Other Ambulatory Visit: Payer: Self-pay | Admitting: *Deleted

## 2016-05-20 ENCOUNTER — Telehealth: Payer: Self-pay | Admitting: *Deleted

## 2016-05-20 ENCOUNTER — Ambulatory Visit (HOSPITAL_BASED_OUTPATIENT_CLINIC_OR_DEPARTMENT_OTHER)

## 2016-05-20 MED ORDER — SODIUM CHLORIDE 0.9 % IV SOLN
1000.0000 mL | Freq: Once | INTRAVENOUS | Status: AC
  Administered 2016-05-20: 1000 mL via INTRAVENOUS

## 2016-05-20 MED ORDER — ZOLEDRONIC ACID 4 MG/100ML IV SOLN
4.0000 mg | Freq: Once | INTRAVENOUS | Status: DC
  Administered 2016-05-20: 4 mg via INTRAVENOUS
  Filled 2016-05-20: qty 100

## 2016-05-20 NOTE — Progress Notes (Unsigned)
Spoke with pt's wife in infusion area. They were instructed to discontinue calcium and several other medications by hospice RN. Med list updated.

## 2016-05-20 NOTE — Patient Instructions (Signed)
Dehydration, Adult Dehydration is a condition in which there is not enough fluid or water in the body. This happens when you lose more fluids than you take in. Important organs, such as the kidneys, brain, and heart, cannot function without a proper amount of fluids. Any loss of fluids from the body can lead to dehydration. Dehydration can range from mild to severe. This condition should be treated right away to prevent it from becoming severe. What are the causes? This condition may be caused by:  Vomiting.  Diarrhea.  Excessive sweating, such as from heat exposure or exercise.  Not drinking enough fluid, especially:  When ill.  While doing activity that requires a lot of energy.  Excessive urination.  Fever.  Infection.  Certain medicines, such as medicines that cause the body to lose excess fluid (diuretics).  Inability to access safe drinking water.  Reduced physical ability to get adequate water and food. What increases the risk? This condition is more likely to develop in people:  Who have a poorly controlled long-term (chronic) illness, such as diabetes, heart disease, or kidney disease.  Who are age 75 or older.  Who are disabled.  Who live in a place with high altitude.  Who play endurance sports. What are the signs or symptoms? Symptoms of mild dehydration may include:  Thirst.  Dry lips.  Slightly dry mouth.  Dry, warm skin.  Dizziness. Symptoms of moderate dehydration may include:  Very dry mouth.  Muscle cramps.  Dark urine. Urine may be the color of tea.  Decreased urine production.  Decreased tear production.  Heartbeat that is irregular or faster than normal (palpitations).  Headache.  Light-headedness, especially when you stand up from a sitting position.  Fainting (syncope). Symptoms of severe dehydration may include:  Changes in skin, such as:  Cold and clammy skin.  Blotchy (mottled) or pale skin.  Skin that does not  quickly return to normal after being lightly pinched and released (poor skin turgor).  Changes in body fluids, such as:  Extreme thirst.  No tear production.  Inability to sweat when body temperature is high, such as in hot weather.  Very little urine production.  Changes in vital signs, such as:  Weak pulse.  Pulse that is more than 100 beats a minute when sitting still.  Rapid breathing.  Low blood pressure.  Other changes, such as:  Sunken eyes.  Cold hands and feet.  Confusion.  Lack of energy (lethargy).  Difficulty waking up from sleep.  Short-term weight loss.  Unconsciousness. How is this diagnosed? This condition is diagnosed based on your symptoms and a physical exam. Blood and urine tests may be done to help confirm the diagnosis. How is this treated? Treatment for this condition depends on the severity. Mild or moderate dehydration can often be treated at home. Treatment should be started right away. Do not wait until dehydration becomes severe. Severe dehydration is an emergency and it needs to be treated in a hospital. Treatment for mild dehydration may include:  Drinking more fluids.  Replacing salts and minerals in your blood (electrolytes) that you may have lost. Treatment for moderate dehydration may include:  Drinking an oral rehydration solution (ORS). This is a drink that helps you replace fluids and electrolytes (rehydrate). It can be found at pharmacies and retail stores. Treatment for severe dehydration may include:  Receiving fluids through an IV tube.  Receiving an electrolyte solution through a feeding tube that is passed through your nose and into  your stomach (nasogastric tube, or NG tube).  Correcting any abnormalities in electrolytes.  Treating the underlying cause of dehydration. Follow these instructions at home:  If directed by your health care provider, drink an ORS:  Make an ORS by following instructions on the  package.  Start by drinking small amounts, about  cup (120 mL) every 5-10 minutes.  Slowly increase how much you drink until you have taken the amount recommended by your health care provider.  Drink enough clear fluid to keep your urine clear or pale yellow. If you were told to drink an ORS, finish the ORS first, then start slowly drinking other clear fluids. Drink fluids such as:  Water. Do not drink only water. Doing that can lead to having too little salt (sodium) in the body (hyponatremia).  Ice chips.  Fruit juice that you have added water to (diluted fruit juice).  Low-calorie sports drinks.  Avoid:  Alcohol.  Drinks that contain a lot of sugar. These include high-calorie sports drinks, fruit juice that is not diluted, and soda.  Caffeine.  Foods that are greasy or contain a lot of fat or sugar.  Take over-the-counter and prescription medicines only as told by your health care provider.  Do not take sodium tablets. This can lead to having too much sodium in the body (hypernatremia).  Eat foods that contain a healthy balance of electrolytes, such as bananas, oranges, potatoes, tomatoes, and spinach.  Keep all follow-up visits as told by your health care provider. This is important. Contact a health care provider if:  You have abdominal pain that:  Gets worse.  Stays in one area (localizes).  You have a rash.  You have a stiff neck.  You are more irritable than usual.  You are sleepier or more difficult to wake up than usual.  You feel weak or dizzy.  You feel very thirsty.  You have urinated only a small amount of very dark urine over 6-8 hours. Get help right away if:  You have symptoms of severe dehydration.  You cannot drink fluids without vomiting.  Your symptoms get worse with treatment.  You have a fever.  You have a severe headache.  You have vomiting or diarrhea that:  Gets worse.  Does not go away.  You have blood or green matter  (bile) in your vomit.  You have blood in your stool. This may cause stool to look black and tarry.  You have not urinated in 6-8 hours.  You faint.  Your heart rate while sitting still is over 100 beats a minute.  You have trouble breathing. This information is not intended to replace advice given to you by your health care provider. Make sure you discuss any questions you have with your health care provider. Document Released: 05/17/2005 Document Revised: 12/12/2015 Document Reviewed: 07/11/2015 Elsevier Interactive Patient Education  2017 [employer]  Zoledronic Acid injection (Hypercalcemia, Oncology) What is this medicine? ZOLEDRONIC ACID (ZOE le dron ik AS id) lowers the amount of calcium loss from bone. It is used to treat too much calcium in your blood from cancer. It is also used to prevent complications of cancer that has spread to the bone. This medicine may be used for other purposes; ask your health care provider or pharmacist if you have questions. COMMON BRAND NAME(S): Zometa What should I tell my health care provider before I take this medicine? They need to know if you have any of these conditions: -aspirin-sensitive asthma -cancer, especially if you  are receiving medicines used to treat cancer -dental disease or wear dentures -infection -kidney disease -receiving corticosteroids like dexamethasone or prednisone -an unusual or allergic reaction to zoledronic acid, other medicines, foods, dyes, or preservatives -pregnant or trying to get pregnant -breast-feeding How should I use this medicine? This medicine is for infusion into a vein. It is given by a health care professional in a hospital or clinic setting. Talk to your pediatrician regarding the use of this medicine in children. Special care may be needed. Overdosage: If you think you have taken too much of this medicine contact a poison control center or emergency room at once. NOTE: This medicine is only for  you. Do not share this medicine with others. What if I miss a dose? It is important not to miss your dose. Call your doctor or health care professional if you are unable to keep an appointment. What may interact with this medicine? -certain antibiotics given by injection -NSAIDs, medicines for pain and inflammation, like ibuprofen or naproxen -some diuretics like bumetanide, furosemide -teriparatide -thalidomide This list may not describe all possible interactions. Give your health care provider a list of all the medicines, herbs, non-prescription drugs, or dietary supplements you use. Also tell them if you smoke, drink alcohol, or use illegal drugs. Some items may interact with your medicine. What should I watch for while using this medicine? Visit your doctor or health care professional for regular checkups. It may be some time before you see the benefit from this medicine. Do not stop taking your medicine unless your doctor tells you to. Your doctor may order blood tests or other tests to see how you are doing. Women should inform their doctor if they wish to become pregnant or think they might be pregnant. There is a potential for serious side effects to an unborn child. Talk to your health care professional or pharmacist for more information. You should make sure that you get enough calcium and vitamin D while you are taking this medicine. Discuss the foods you eat and the vitamins you take with your health care professional. Some people who take this medicine have severe bone, joint, and/or muscle pain. This medicine may also increase your risk for jaw problems or a broken thigh bone. Tell your doctor right away if you have severe pain in your jaw, bones, joints, or muscles. Tell your doctor if you have any pain that does not go away or that gets worse. Tell your dentist and dental surgeon that you are taking this medicine. You should not have major dental surgery while on this medicine. See  your dentist to have a dental exam and fix any dental problems before starting this medicine. Take good care of your teeth while on this medicine. Make sure you see your dentist for regular follow-up appointments. What side effects may I notice from receiving this medicine? Side effects that you should report to your doctor or health care professional as soon as possible: -allergic reactions like skin rash, itching or hives, swelling of the face, lips, or tongue -anxiety, confusion, or depression -breathing problems -changes in vision -eye pain -feeling faint or lightheaded, falls -jaw pain, especially after dental work -mouth sores -muscle cramps, stiffness, or weakness -redness, blistering, peeling or loosening of the skin, including inside the mouth -trouble passing urine or change in the amount of urine Side effects that usually do not require medical attention (report to your doctor or health care professional if they continue or are bothersome): -bone, joint,  or muscle pain -constipation -diarrhea -fever -hair loss -irritation at site where injected -loss of appetite -nausea, vomiting -stomach upset -trouble sleeping -trouble swallowing -weak or tired This list may not describe all possible side effects. Call your doctor for medical advice about side effects. You may report side effects to FDA at 1-800-FDA-1088. Where should I keep my medicine? This drug is given in a hospital or clinic and will not be stored at home. NOTE: This sheet is a summary. It may not cover all possible information. If you have questions about this medicine, talk to your doctor, pharmacist, or health care provider.  2017 Elsevier/Gold Standard (2013-10-13 14:19:39)

## 2016-05-20 NOTE — Telephone Encounter (Signed)
CMET reviewed by Dr. Benay Spice. Spoke with pt's wife, hospice admission RN has seen pt. They made a few medication adjustments. Telmisartan discontinued. He is lethargic today. Offered IV hydration/ Zometa infusion to treat hypercalcemia. Wife asks that we coordinate with hospice to be sure this is something they will cover. Spoke with Bedelia Person, RN. She will discuss with medical director and call back. Return call from [place], Hospice RN: Zometa and IV fluids approved. Sheree discussed with pt's wife, they will come in today at [facility].

## 2016-05-21 ENCOUNTER — Telehealth: Payer: Self-pay | Admitting: *Deleted

## 2016-05-21 ENCOUNTER — Ambulatory Visit (HOSPITAL_COMMUNITY): Admission: RE | Admit: 2016-05-21 | Payer: Medicare Other | Source: Ambulatory Visit

## 2016-05-21 ENCOUNTER — Ambulatory Visit: Payer: Medicare Other

## 2016-05-21 ENCOUNTER — Other Ambulatory Visit (HOSPITAL_COMMUNITY): Payer: Medicare Other

## 2016-05-21 LAB — CANCER ANTIGEN 19-9: CA 19-9: 8083 U/mL — ABNORMAL HIGH (ref 0–35)

## 2016-05-21 NOTE — Telephone Encounter (Signed)
Call received from [facility] with [facility] to inform Dr. Benay Spice that patient will be transported via ambulance this afternoon to hospice home in [facility].

## 2016-05-21 NOTE — Telephone Encounter (Signed)
Message received from patient's wife stating she is unable to care for patient at home and is requesting hospice facility in [facility].  Call placed to [facility] to inform them of patient's wife's request.  They stated that a nurse and SW would be visiting pt today and that beds were available for admission to their facility in [place].  Call placed back to patient's wife to inform her that I spoke with [facility] and informed them of her request for admission to hospice facility and that beds are available.  Patient's wife appreciative of call back.  Instructed patient's wife to call [facility] with any other concerns or questions.

## 2016-05-25 ENCOUNTER — Ambulatory Visit: Payer: Medicare Other | Admitting: Oncology

## 2016-05-25 ENCOUNTER — Telehealth: Payer: Self-pay | Admitting: *Deleted

## 2016-05-25 NOTE — Telephone Encounter (Signed)
Wife called and left VM that patient died on 2016/06/07 at 51. Please cancel appointment on 12/26. MD notified.

## 2016-05-27 ENCOUNTER — Ambulatory Visit (HOSPITAL_COMMUNITY): Payer: Medicare Other

## 2016-05-27 ENCOUNTER — Other Ambulatory Visit (HOSPITAL_COMMUNITY): Payer: Medicare Other

## 2016-05-27 ENCOUNTER — Telehealth: Payer: Self-pay | Admitting: *Deleted

## 2016-05-27 NOTE — Telephone Encounter (Signed)
Message from [place], [place] RN reporting pt expired on 12/25. Will make MD aware.

## 2016-05-31 DEATH — deceased

## 2016-06-03 ENCOUNTER — Ambulatory Visit: Payer: Medicare Other

## 2016-06-03 ENCOUNTER — Other Ambulatory Visit: Payer: Medicare Other

## 2016-06-03 ENCOUNTER — Encounter: Payer: Self-pay | Admitting: General Practice

## 2016-06-03 ENCOUNTER — Ambulatory Visit: Payer: Medicare Other | Admitting: Nurse Practitioner

## 2016-06-03 NOTE — Progress Notes (Signed)
[facility] Note  Mailing handwritten sympathy card to Howard Charles wife [name], whom I met in chemo ed.   [place], [place], [facility] Pager [phone] Voicemail [phone]

## 2016-06-09 ENCOUNTER — Encounter (HOSPITAL_COMMUNITY): Admission: RE | Payer: Self-pay | Source: Ambulatory Visit

## 2016-06-09 ENCOUNTER — Inpatient Hospital Stay (HOSPITAL_COMMUNITY): Admission: RE | Admit: 2016-06-09 | Payer: Medicare Other | Source: Ambulatory Visit | Admitting: Orthopedic Surgery

## 2016-06-09 SURGERY — CONVERSION, PREVIOUS HIP SURGERY, TO TOTAL HIP ARTHROPLASTY
Anesthesia: Choice | Laterality: Left

## 2016-06-26 IMAGING — CT CT HEAD W/O CM
2 of 4 series · 17 of 30 positions shown, 20 images · non-contrast
Comparison: 06/29/2012

CLINICAL DATA: Witnessed fall today.

EXAM:
CT HEAD WITHOUT CONTRAST
TECHNIQUE: Contiguous axial images were obtained from the base of the skull
through the vertex without intravenous contrast.

[Series 2: head w/o · axial · non-contrast · 0.47mm/px · z∈[+1233,+1358]mm · 12 of 31 slices shown, 15 images]
[im 3/31  brain]
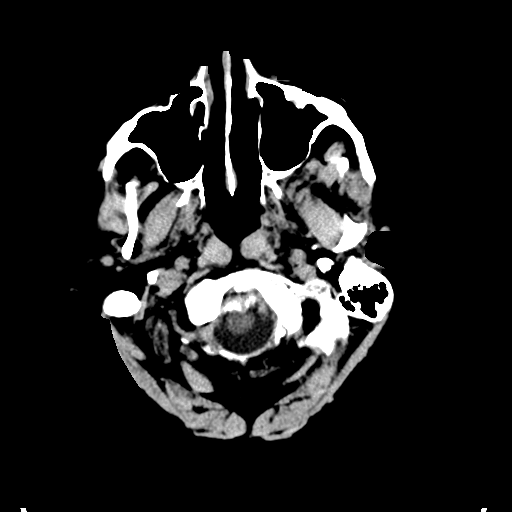
[im 3/31  bone]
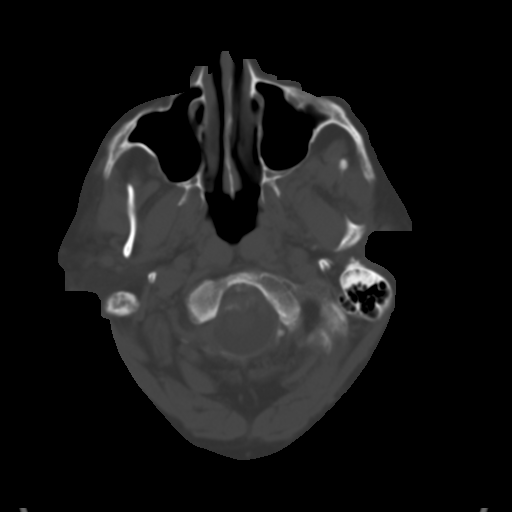
[im 5/31  brain]
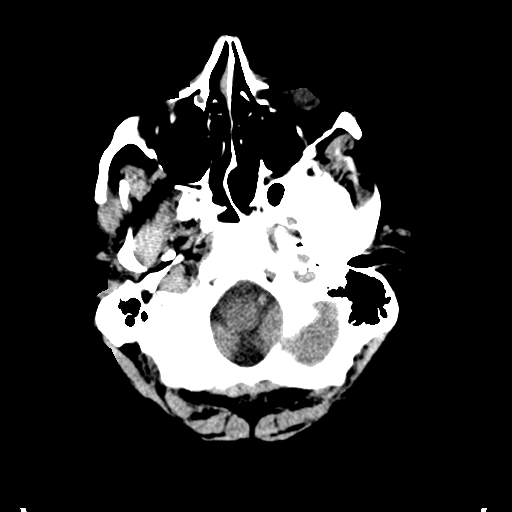
[im 7/31  brain]
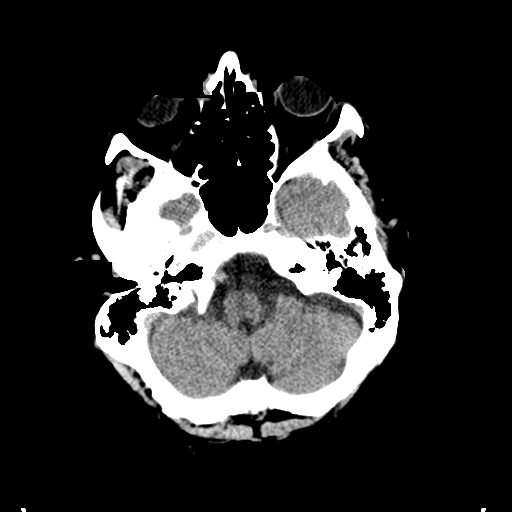
[im 10/31  brain]
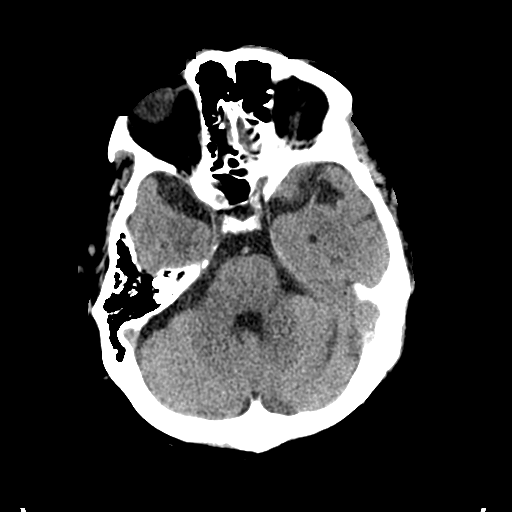
[im 12/31  brain]
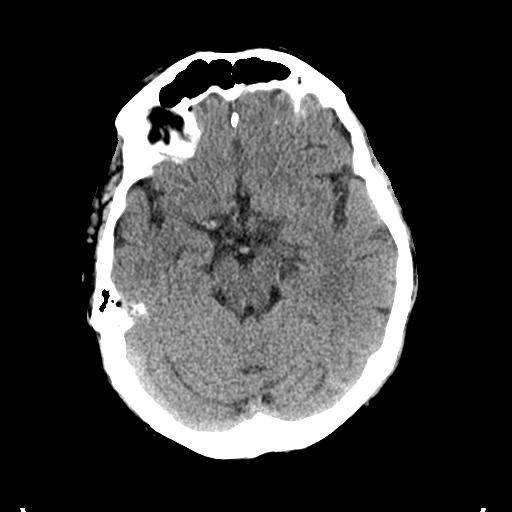
[im 12/31  bone]
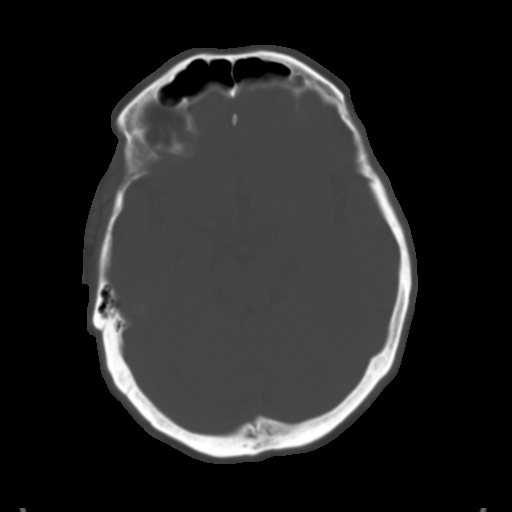
[im 14/31  brain]
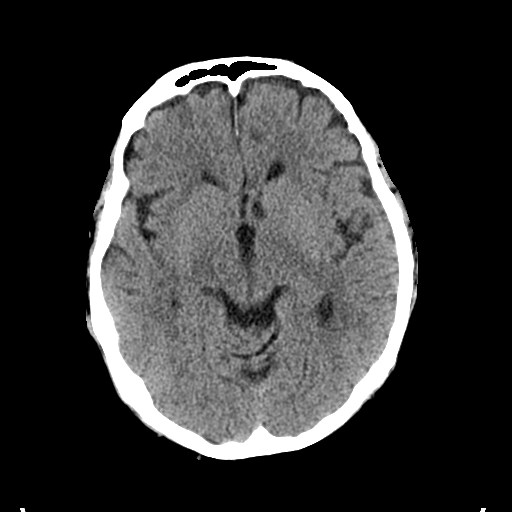
[im 17/31  brain]
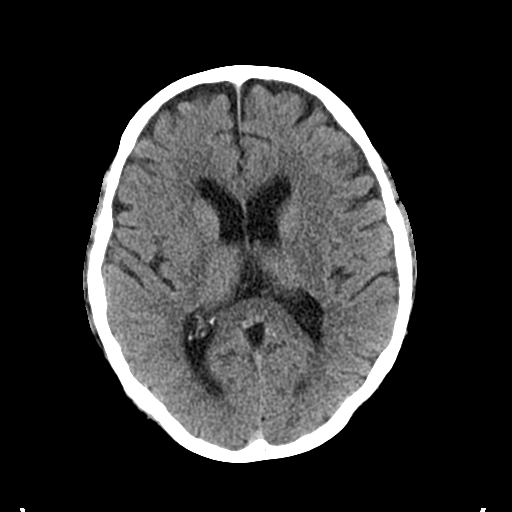
[im 19/31  brain]
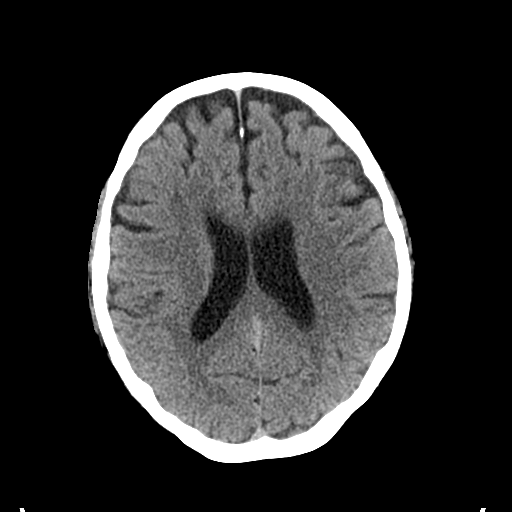
[im 21/31  brain]
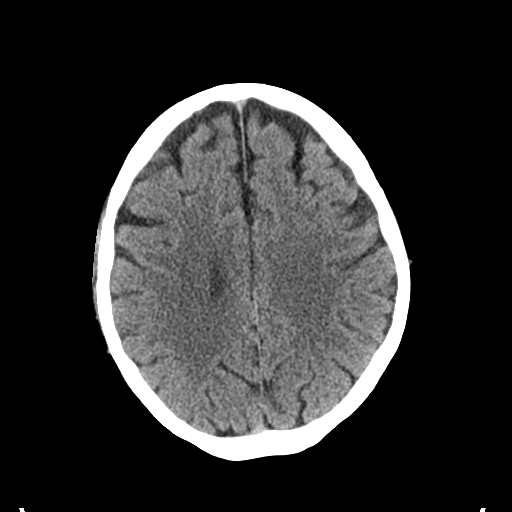
[im 21/31  bone]
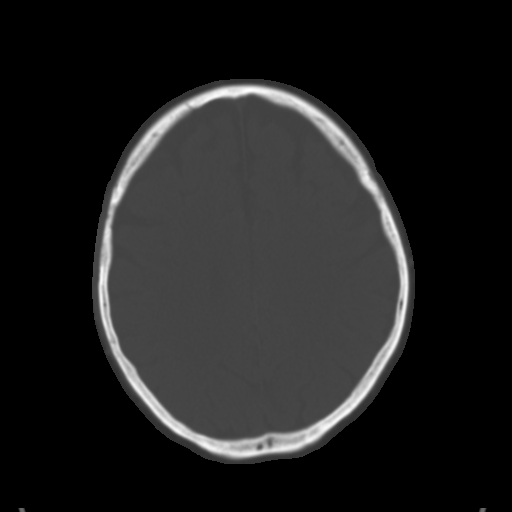
[im 24/31  brain]
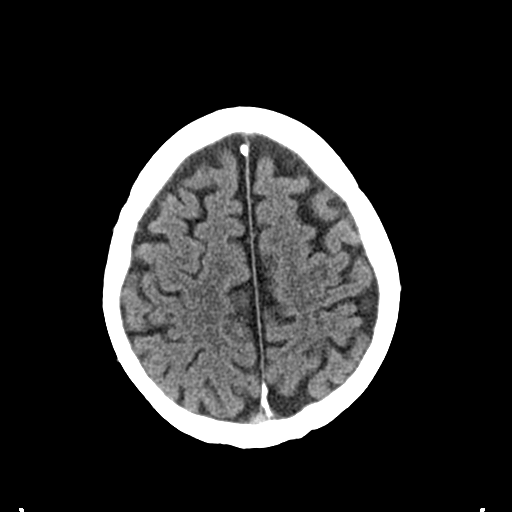
[im 26/31  brain]
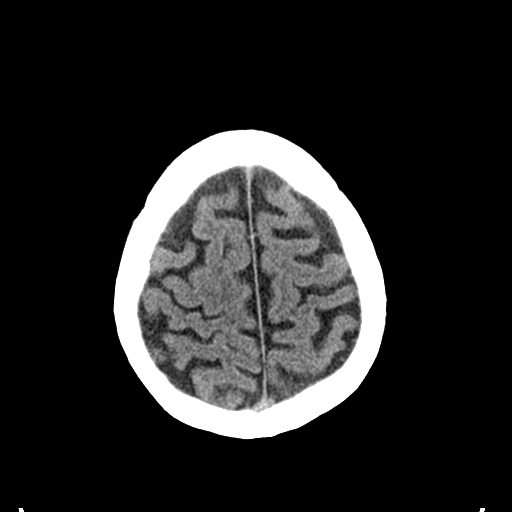
[im 28/31  brain]
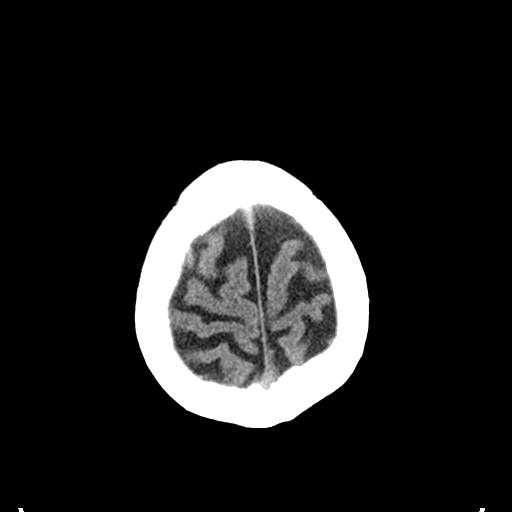

[Series 3: bone windows · axial · 0.47mm/px · z∈[+1233,+1303]mm · 5 of 31 slices shown]
[im 3/31  bone]
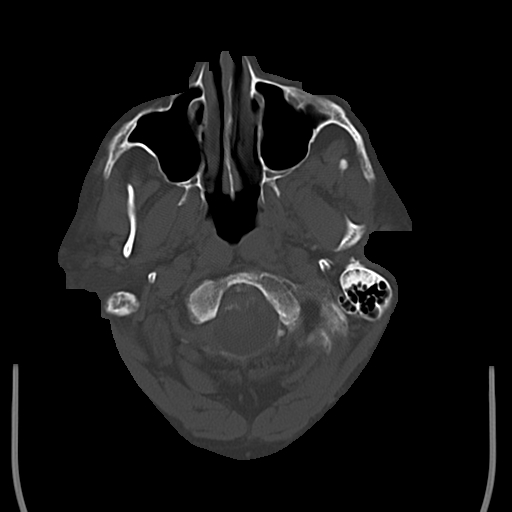
[im 7/31  bone]
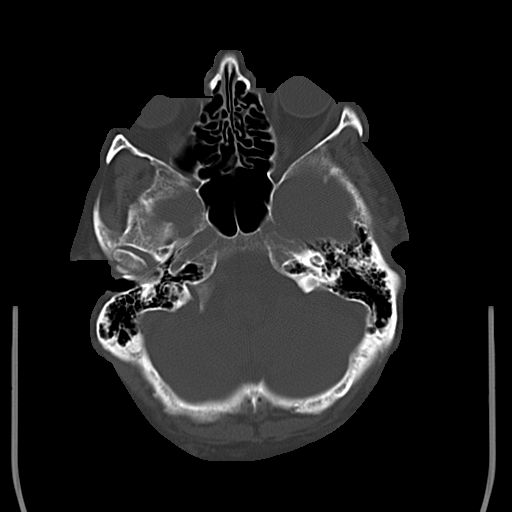
[im 10/31  bone]
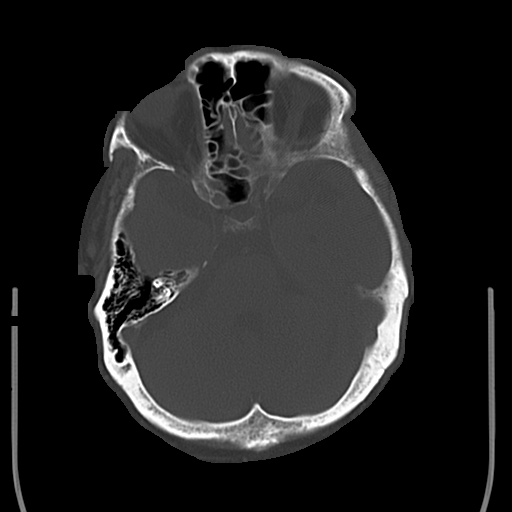
[im 14/31  bone]
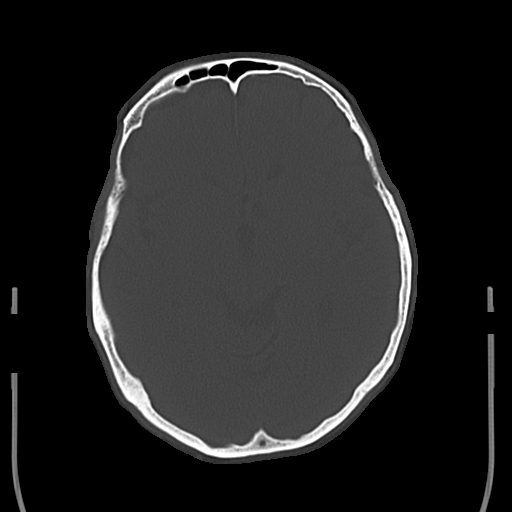
[im 17/31  bone]
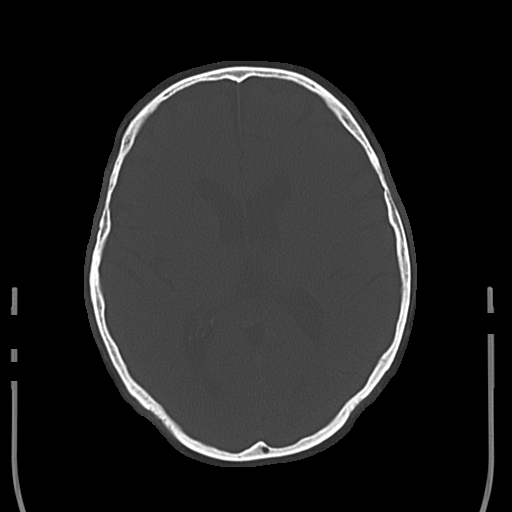

[17 of 30 positions shown; findings below may reference images not displayed]

FINDINGS: No acute intracranial abnormality. Specifically, no hemorrhage,
hydrocephalus, mass lesion, acute infarction, or significant
intracranial injury. No acute calvarial abnormality. Mastoid air
cells are clear as are the visualized paranasal sinuses.
IMPRESSION: No intracranial abnormality.
